# Patient Record
Sex: Female | Born: 1985 | Race: White | Hispanic: No | Marital: Single | Smoking: Never smoker
Health system: Southern US, Community
[De-identification: ages and names within clinical notes are randomized; demographics above are authoritative.]

## PROBLEM LIST (undated history)

## (undated) DIAGNOSIS — E559 Vitamin D deficiency, unspecified: Secondary | ICD-10-CM

## (undated) DIAGNOSIS — G4730 Sleep apnea, unspecified: Secondary | ICD-10-CM

## (undated) DIAGNOSIS — F329 Major depressive disorder, single episode, unspecified: Secondary | ICD-10-CM

## (undated) DIAGNOSIS — R7303 Prediabetes: Secondary | ICD-10-CM

## (undated) DIAGNOSIS — F909 Attention-deficit hyperactivity disorder, unspecified type: Secondary | ICD-10-CM

## (undated) DIAGNOSIS — R87612 Low grade squamous intraepithelial lesion on cytologic smear of cervix (LGSIL): Secondary | ICD-10-CM

## (undated) DIAGNOSIS — K602 Anal fissure, unspecified: Secondary | ICD-10-CM

## (undated) DIAGNOSIS — I471 Supraventricular tachycardia: Secondary | ICD-10-CM

## (undated) DIAGNOSIS — E669 Obesity, unspecified: Secondary | ICD-10-CM

## (undated) DIAGNOSIS — F32A Depression, unspecified: Secondary | ICD-10-CM

## (undated) DIAGNOSIS — Z8679 Personal history of other diseases of the circulatory system: Secondary | ICD-10-CM

## (undated) DIAGNOSIS — K769 Liver disease, unspecified: Secondary | ICD-10-CM

## (undated) DIAGNOSIS — B2790 Infectious mononucleosis, unspecified without complication: Secondary | ICD-10-CM

## (undated) DIAGNOSIS — I10 Essential (primary) hypertension: Secondary | ICD-10-CM

## (undated) DIAGNOSIS — F419 Anxiety disorder, unspecified: Secondary | ICD-10-CM

## (undated) DIAGNOSIS — M549 Dorsalgia, unspecified: Secondary | ICD-10-CM

## (undated) DIAGNOSIS — I499 Cardiac arrhythmia, unspecified: Secondary | ICD-10-CM

## (undated) DIAGNOSIS — K219 Gastro-esophageal reflux disease without esophagitis: Secondary | ICD-10-CM

## (undated) DIAGNOSIS — E785 Hyperlipidemia, unspecified: Secondary | ICD-10-CM

## (undated) HISTORY — DX: Anal fissure, unspecified: K60.2

## (undated) HISTORY — DX: Essential (primary) hypertension: I10

## (undated) HISTORY — DX: Low grade squamous intraepithelial lesion on cytologic smear of cervix (LGSIL): R87.612

## (undated) HISTORY — DX: Infectious mononucleosis, unspecified without complication: B27.90

## (undated) HISTORY — DX: Gastro-esophageal reflux disease without esophagitis: K21.9

## (undated) HISTORY — DX: Hyperlipidemia, unspecified: E78.5

## (undated) HISTORY — DX: Anxiety disorder, unspecified: F41.9

## (undated) HISTORY — DX: Personal history of other diseases of the circulatory system: Z86.79

## (undated) HISTORY — DX: Depression, unspecified: F32.A

## (undated) HISTORY — DX: Sleep apnea, unspecified: G47.30

## (undated) HISTORY — DX: Vitamin D deficiency, unspecified: E55.9

## (undated) HISTORY — DX: Obesity, unspecified: E66.9

## (undated) HISTORY — DX: Major depressive disorder, single episode, unspecified: F32.9

## (undated) HISTORY — DX: Attention-deficit hyperactivity disorder, unspecified type: F90.9

## (undated) HISTORY — DX: Liver disease, unspecified: K76.9

## (undated) HISTORY — PX: WISDOM TOOTH EXTRACTION: SHX21

## (undated) HISTORY — DX: Dorsalgia, unspecified: M54.9

## (undated) HISTORY — DX: Supraventricular tachycardia: I47.1

---

## 2012-12-13 DIAGNOSIS — J189 Pneumonia, unspecified organism: Secondary | ICD-10-CM

## 2012-12-13 HISTORY — DX: Pneumonia, unspecified organism: J18.9

## 2013-12-15 ENCOUNTER — Ambulatory Visit (INDEPENDENT_AMBULATORY_CARE_PROVIDER_SITE_OTHER): Payer: BC Managed Care – PPO | Admitting: Family Medicine

## 2013-12-15 ENCOUNTER — Ambulatory Visit: Payer: BC Managed Care – PPO

## 2013-12-15 VITALS — BP 106/84 | HR 88 | Temp 98.5°F | Resp 18

## 2013-12-15 DIAGNOSIS — M545 Low back pain, unspecified: Secondary | ICD-10-CM

## 2013-12-15 MED ORDER — HYDROCODONE-ACETAMINOPHEN 5-325 MG PO TABS: 1.0000 | ORAL_TABLET | Freq: Three times a day (TID) | ORAL | Status: DC | PRN

## 2013-12-15 MED ORDER — CYCLOBENZAPRINE HCL 10 MG PO TABS: 10.0000 mg | ORAL_TABLET | Freq: Two times a day (BID) | ORAL | Status: DC | PRN

## 2013-12-15 NOTE — Patient Instructions (Signed)
Keep moving around to keep your back loose.   Use the hydrocodone (pain medication) and the flexeril (muscle relaxer) as needed.  Remember that these medications can make you sleepy, especially when used in combination.   You may also use NSAID medication such as aleve or ibuprofen.    Let me know if you are not better in the next few days- Sooner if worse.

## 2013-12-15 NOTE — Progress Notes (Signed)
[facility] [address] [place] [address] [phone]  Date:  12/15/2013   Name:  Rose Cortez   DOB:  09-22-1986   MRN:  [id number]  PCP:  No primary provider on file.    Chief Complaint: Back Pain   History of Present Illness:  Rose Cortez is a 28 y.o. very pleasant female patient who presents with the following:  Today is Sunday. On Thursday evening she seemed to sprain her back while at work- she is not sure exactly what she did, but she bent to pick up a rack of glasses and she noted pain after that.  She is taking advil and using heat and ice.  However her pain seems to be getting worse.   She did have some back trouble a couple of years ago with pain in her lower back.  This resolved after she saw a chiropractor a few times The pain does run down her buttocks and sometimes into her left leg down to the knee.    She hurts more when she sits or lays down and then tries to stand up.    She is healthy except for high cholesterol and depression.  Admits she has not exercised much recently and thinks this may have triggered her back injury.  No hematuria or blood in her urine.  No history of kidney stones.   So far she has tried advil and generic "back and body" pills OTC.   She has not noted any persistent numbness or weakness in her legs. She will have an "occasional jolt" in her leg only.   No genital numbness or incontinence of bowel or bladder.    She has had to miss work for the last couple of days and will need a note.     Her menses were just about one month ago, and she expects to start again any day. She has not been SA for a few months and does not feel there is any risk of pregnancy   So far today she has taken 2 advil.   She was able to drive here today.   She did request that we do back x-rays. I did request that we check and HCG prior to doing x-rays However then changed her mind and elected not to have x-rays after all so canceled urine  as well.  Pregnancy risk is low and she will let us know if her menses do not come as usual.  There are no active problems to display for this patient.   Past Medical History  Diagnosis Date  . Hyperlipidemia   . Depression   . Anxiety     No past surgical history on file.  History  Substance Use Topics  . Smoking status: Never Smoker   . Smokeless tobacco: Not on file  . Alcohol Use: Yes     Comment: weekly    Family History  Problem Relation Age of Onset  . Hyperlipidemia Mother   . Hyperlipidemia Father   . Depression Sister     No Known Allergies  Medication list has been reviewed and updated.  No current outpatient prescriptions on file prior to visit.   No current facility-administered medications on file prior to visit.    Review of Systems:  As per HPI- otherwise negative.   Physical Examination: Filed Vitals:   12/15/13 1614  BP: 106/84  Pulse: 88  Temp: 98.5 F (36.9 C)  Resp: 18   Filed Vitals:  Cannot calculate BMI with a height equal to zero. Ideal Body Weight:    GEN: WDWN, NAD, Non-toxic, A & O x 3, appears well HEENT: Atraumatic, Normocephalic. Neck supple. No masses, No LAD. Ears and Nose: No external deformity. CV: RRR, No M/G/R. No JVD. No thrill. No extra heart sounds. PULM: CTA B, no wheezes, crackles, rhonchi. No retractions. No resp. distress. No accessory muscle use. EXTR: No c/c/e NEURO Normal gait.  PSYCH: Normally interactive. Conversant. Not depressed or anxious appearing.  Calm demeanor. Her lumbar paraspinous muscles are tight and tender bilaterally. She is also in spasm in her buttocks, left more than right.  Back flexion and extension quite limited by pain.   Normal strength, sensation and DTR both LE.  She has pain with SLR on the left only.    Assessment and Plan: Lumbar pain - Plan: HYDROcodone-acetaminophen (NORCO/VICODIN) 5-325 MG per tablet, cyclobenzaprine (FLEXERIL) 10 MG tablet, CANCELED: POCT urine  pregnancy, CANCELED: POCT urinalysis dipstick, CANCELED: DG Lumbar Spine Complete  Treat for lumbar pain and spasm with vicodin and flexeril as needed.  Cautioned against using these meds in combination as they may cause excessive drowsiness.  If she does use both try splitting flexeril in half.   Close follow-up if not better- Sooner if worse.     Signed Abbe AmsterdamJessica Annalucia Laino, MD

## 2014-01-26 ENCOUNTER — Ambulatory Visit (INDEPENDENT_AMBULATORY_CARE_PROVIDER_SITE_OTHER): Payer: BC Managed Care – PPO | Admitting: Internal Medicine

## 2014-01-26 VITALS — BP 134/82 | HR 102 | Temp 98.9°F | Resp 18 | Ht 68.0 in | Wt 224.1 lb

## 2014-01-26 DIAGNOSIS — J9801 Acute bronchospasm: Secondary | ICD-10-CM

## 2014-01-26 DIAGNOSIS — R05 Cough: Secondary | ICD-10-CM

## 2014-01-26 DIAGNOSIS — J019 Acute sinusitis, unspecified: Secondary | ICD-10-CM

## 2014-01-26 DIAGNOSIS — R059 Cough, unspecified: Secondary | ICD-10-CM

## 2014-01-26 MED ORDER — AMOXICILLIN 500 MG PO CAPS: 1000.0000 mg | ORAL_CAPSULE | Freq: Two times a day (BID) | ORAL | Status: AC

## 2014-01-26 MED ORDER — PREDNISONE 20 MG PO TABS: ORAL_TABLET | ORAL | Status: DC

## 2014-01-26 NOTE — Progress Notes (Signed)
This chart was scribed for Ellamae Sia, MD by Luisa Dago, ED Scribe. This patient was seen in room 8 and the patient's care was started at 11:25 AM. Subjective:    Patient ID: Rose Cortez, female    DOB: July 24, 1986, 28 y.o.   MRN: [id number]  HPI HPI Comments: Rose Cortez is a 28 y.o. female who works at a [employer] [facility] complaining of a constant cough that started 1 month ago. Pt states that she's had three colds in the last month. She states that the cough is worse upon waking but does not interfere with daily activities as the day goes on. She is also complaining of associated sinus congestion and pressure. Denies any ear pain, sore throat, fever, or chills.    There are no active problems to display for this patient.  Past Medical History  Diagnosis Date   Hyperlipidemia    Depression    Anxiety    No past surgical history on file. No Known Allergies Prior to Admission medications   Medication Sig Start Date End Date Taking? Authorizing Provider  citalopram (CELEXA) 10 MG tablet Take 10 mg by mouth daily.   Yes Historical Provider, MD  simvastatin (ZOCOR) 40 MG tablet Take 40 mg by mouth daily.   Yes Historical Provider, MD   History   Social History   Marital Status: Single    Spouse Name: N/A    Number of Children: N/A   Years of Education: N/A   Occupational History   Not on file.   Social History Main Topics   Smoking status: Never Smoker    Smokeless tobacco: Not on file   Alcohol Use: Yes     Comment: weekly   Drug Use: Not on file   Sexual Activity: Yes   Other Topics Concern   Not on file   Social History Narrative   No narrative on file   Review of Systems  review of systems was obtained and all systems are negative except as noted in the HPI and PMH.      Objective:   Physical Exam  Nursing note and vitals reviewed. Constitutional: She is oriented to person, place, and time. She  appears well-developed and well-nourished. No distress.  HENT:  Head: Normocephalic and atraumatic.  Right Ear: External ear normal.  Left Ear: External ear normal.  Nose: Nose normal.  Mouth/Throat: Oropharynx is clear and moist. No oropharyngeal exudate.  Eyes: Conjunctivae and EOM are normal. Pupils are equal, round, and reactive to light. Right eye exhibits no discharge. Left eye exhibits no discharge.  Neck: Neck supple. No thyromegaly present.  Cardiovascular: Normal rate, regular rhythm, normal heart sounds and intact distal pulses.   No murmur heard. Pulmonary/Chest: Effort normal. No respiratory distress. She has wheezes in the left middle field and the left lower field.  Musculoskeletal: Normal range of motion. She exhibits no edema and no tenderness.  Lymphadenopathy:    She has no cervical adenopathy.  Neurological: She is alert and oriented to person, place, and time.  Skin: Skin is warm and dry.  Psychiatric: She has a normal mood and affect. Her behavior is normal.     Filed Vitals:   01/26/14 1039  BP: 134/82  Pulse: 102  Temp: 98.9 F (37.2 C)  TempSrc: Oral  Resp: 18  Height: 5\' 8"  (1.727 m)  Weight: 224 lb 2 oz (101.662 kg)  SpO2: 98%        Assessment &  Plan:  Acute bronchospasm  Acute sinusitis, unspecified  Cough  Meds ordered this encounter  Medications   amoxicillin (AMOXIL) 500 MG capsule    Sig: Take 2 capsules (1,000 mg total) by mouth 2 (two) times daily.    Dispense:  40 capsule    Refill:  0   predniSONE (DELTASONE) 20 MG tablet    Sig: 3/3/2/2/1/1 single daily dose for 6 days    Dispense:  12 tablet    Refill:  0   Following in 1 week if not well  I have completed the patient encounter in its entirety as documented by the scribe, with editing by me where necessary. Robert P. Merla Richesoolittle, M.D.

## 2014-02-03 ENCOUNTER — Ambulatory Visit (INDEPENDENT_AMBULATORY_CARE_PROVIDER_SITE_OTHER): Payer: BC Managed Care – PPO | Admitting: Emergency Medicine

## 2014-02-03 ENCOUNTER — Ambulatory Visit: Payer: BC Managed Care – PPO

## 2014-02-03 VITALS — BP 120/82 | HR 115 | Temp 98.1°F | Resp 18 | Ht 68.5 in | Wt 227.4 lb

## 2014-02-03 DIAGNOSIS — R05 Cough: Secondary | ICD-10-CM

## 2014-02-03 DIAGNOSIS — R059 Cough, unspecified: Secondary | ICD-10-CM

## 2014-02-03 DIAGNOSIS — J018 Other acute sinusitis: Secondary | ICD-10-CM

## 2014-02-03 DIAGNOSIS — J209 Acute bronchitis, unspecified: Secondary | ICD-10-CM

## 2014-02-03 MED ORDER — ALBUTEROL SULFATE HFA 108 (90 BASE) MCG/ACT IN AERS: 2.0000 | INHALATION_SPRAY | RESPIRATORY_TRACT | Status: DC | PRN

## 2014-02-03 MED ORDER — PROMETHAZINE-CODEINE 6.25-10 MG/5ML PO SYRP: 5.0000 mL | ORAL_SOLUTION | Freq: Four times a day (QID) | ORAL | Status: DC | PRN

## 2014-02-03 NOTE — Patient Instructions (Signed)

## 2014-02-03 NOTE — Progress Notes (Signed)
[facility] [address], [place] [id number] [phone]  Date:  02/03/2014   Name:  Rose Cortez   DOB:  05-Apr-1986   MRN:  [id number]  PCP:  No primary provider on file.    Chief Complaint: Follow-up   History of Present Illness:  Rose Cortez is a 28 y.o. very pleasant female patient who presents with the following:  Seen a week ago with a month long cough productive of scant mucopurulent sputum.   Treated with prednisone and prednisone. Says wheezing has improved.  She still feels congestion in her head and no shortness of breath.  Says her wheezing now is limited to post tussive.  No fever or chills.  No nausea or vomiting.  No stool change or rash.  Lacks energy.  No improvement with over the counter medications or other home remedies. Denies other complaint or health concern today.   There are no active problems to display for this patient.   Past Medical History  Diagnosis Date  . Hyperlipidemia   . Depression   . Anxiety     No past surgical history on file.  History  Substance Use Topics  . Smoking status: Never Smoker   . Smokeless tobacco: Not on file  . Alcohol Use: Yes     Comment: weekly    Family History  Problem Relation Age of Onset  . Hyperlipidemia Mother   . Hyperlipidemia Father   . Depression Sister     No Known Allergies  Medication list has been reviewed and updated.  Current Outpatient Prescriptions on File Prior to Visit  Medication Sig Dispense Refill  . amoxicillin (AMOXIL) 500 MG capsule Take 2 capsules (1,000 mg total) by mouth 2 (two) times daily.  40 capsule  0  . citalopram (CELEXA) 10 MG tablet Take 10 mg by mouth daily.      . simvastatin (ZOCOR) 40 MG tablet Take 40 mg by mouth daily.      . predniSONE (DELTASONE) 20 MG tablet 3/3/2/2/1/1 single daily dose for 6 days  12 tablet  0   No current facility-administered medications on file prior to visit.    Review of Systems:  As per HPI, otherwise  negative.    Physical Examination: Filed Vitals:   02/03/14 0819  BP: 120/82  Pulse: 115  Temp: 98.1 F (36.7 C)  Resp: 18   Filed Vitals:   02/03/14 0819  Height: 5' 8.5" (1.74 m)  Weight: 227 lb 6.4 oz (103.148 kg)   Body mass index is 34.07 kg/(m^2). Ideal Body Weight: Weight in (lb) to have BMI = 25: 166.5  GEN: WDWN, NAD, Non-toxic, A & O x 3 HEENT: Atraumatic, Normocephalic. Neck supple. No masses, No LAD. Ears and Nose: No external deformity. Purulent nasal drainage CV: RRR, No M/G/R. No JVD. No thrill. No extra heart sounds. PULM: CTA B, no wheezes, crackles, rhonchi. No retractions. No resp. distress. No accessory muscle use. ABD: S, NT, ND, +BS. No rebound. No HSM. EXTR: No c/c/e NEURO Normal gait.  PSYCH: Normally interactive. Conversant. Not depressed or anxious appearing.  Calm demeanor.    Assessment and Plan: Sinusitis Bronchitis Phen c cod Albuterol  Signed,  Phillips OdorJeffery Audreana Hancox, MD   [facility] reading (PRIMARY) by  Dr. Dareen PianoAnderson.  Chest negative.

## 2014-07-08 ENCOUNTER — Ambulatory Visit (INDEPENDENT_AMBULATORY_CARE_PROVIDER_SITE_OTHER): Payer: BC Managed Care – PPO

## 2014-07-08 ENCOUNTER — Ambulatory Visit (INDEPENDENT_AMBULATORY_CARE_PROVIDER_SITE_OTHER): Payer: BC Managed Care – PPO | Admitting: Family Medicine

## 2014-07-08 VITALS — BP 144/90 | HR 102 | Temp 98.5°F | Resp 20 | Ht 68.0 in | Wt 225.6 lb

## 2014-07-08 DIAGNOSIS — M5430 Sciatica, unspecified side: Secondary | ICD-10-CM

## 2014-07-08 DIAGNOSIS — M5442 Lumbago with sciatica, left side: Secondary | ICD-10-CM

## 2014-07-08 MED ORDER — METHYLPREDNISOLONE SODIUM SUCC 125 MG IJ SOLR
125.0000 mg | Freq: Once | INTRAMUSCULAR | Status: AC
  Administered 2014-07-08: 125 mg via INTRAMUSCULAR

## 2014-07-08 MED ORDER — KETOROLAC TROMETHAMINE 60 MG/2ML IM SOLN
60.0000 mg | Freq: Once | INTRAMUSCULAR | Status: AC
  Administered 2014-07-08: 60 mg via INTRAMUSCULAR

## 2014-07-08 MED ORDER — OXYCODONE-ACETAMINOPHEN 5-325 MG PO TABS: 1.0000 | ORAL_TABLET | Freq: Three times a day (TID) | ORAL | Status: DC | PRN

## 2014-07-08 MED ORDER — PREDNISONE 10 MG PO TABS: ORAL_TABLET | ORAL | Status: DC

## 2014-07-08 MED ORDER — CYCLOBENZAPRINE HCL 10 MG PO TABS: 10.0000 mg | ORAL_TABLET | Freq: Three times a day (TID) | ORAL | Status: DC | PRN

## 2014-07-08 NOTE — Patient Instructions (Signed)
If you have any numbness, weakness, fevers, chills, or changes in your bowels or bladder, come back immediately. Start taking a senokot S twice a day for your constipation as the oxycodone will only make it worse.  Sciatica Sciatica is pain, weakness, numbness, or tingling along the path of the sciatic nerve. The nerve starts in the lower back and runs down the back of each leg. The nerve controls the muscles in the lower leg and in the back of the knee, while also providing sensation to the back of the thigh, lower leg, and the sole of your foot. Sciatica is a symptom of another medical condition. For instance, nerve damage or certain conditions, such as a herniated disk or bone spur on the spine, pinch or put pressure on the sciatic nerve. This causes the pain, weakness, or other sensations normally associated with sciatica. Generally, sciatica only affects one side of the body. CAUSES   Herniated or slipped disc.  Degenerative disk disease.  A pain disorder involving the narrow muscle in the buttocks (piriformis syndrome).  Pelvic injury or fracture.  Pregnancy.  Tumor (rare). SYMPTOMS  Symptoms can vary from mild to very severe. The symptoms usually travel from the low back to the buttocks and down the back of the leg. Symptoms can include:  Mild tingling or dull aches in the lower back, leg, or hip.  Numbness in the back of the calf or sole of the foot.  Burning sensations in the lower back, leg, or hip.  Sharp pains in the lower back, leg, or hip.  Leg weakness.  Severe back pain inhibiting movement. These symptoms may get worse with coughing, sneezing, laughing, or prolonged sitting or standing. Also, being overweight may worsen symptoms. DIAGNOSIS  Your caregiver will perform a physical exam to look for common symptoms of sciatica. He or she may ask you to do certain movements or activities that would trigger sciatic nerve pain. Other tests may be performed to find the cause  of the sciatica. These may include:  Blood tests.  X-rays.  Imaging tests, such as an MRI or CT scan. TREATMENT  Treatment is directed at the cause of the sciatic pain. Sometimes, treatment is not necessary and the pain and discomfort goes away on its own. If treatment is needed, your caregiver may suggest:  Over-the-counter medicines to relieve pain.  Prescription medicines, such as anti-inflammatory medicine, muscle relaxants, or narcotics.  Applying heat or ice to the painful area.  Steroid injections to lessen pain, irritation, and inflammation around the nerve.  Reducing activity during periods of pain.  Exercising and stretching to strengthen your abdomen and improve flexibility of your spine. Your caregiver may suggest losing weight if the extra weight makes the back pain worse.  Physical therapy.  Surgery to eliminate what is pressing or pinching the nerve, such as a bone spur or part of a herniated disk. HOME CARE INSTRUCTIONS   Only take over-the-counter or prescription medicines for pain or discomfort as directed by your caregiver.  Apply ice to the affected area for 20 minutes, 3-4 times a day for the first 48-72 hours. Then try heat in the same way.  Exercise, stretch, or perform your usual activities if these do not aggravate your pain.  Attend physical therapy sessions as directed by your caregiver.  Keep all follow-up appointments as directed by your caregiver.  Do not wear high heels or shoes that do not provide proper support.  Check your mattress to see if it is  too soft. A firm mattress may lessen your pain and discomfort. SEEK IMMEDIATE MEDICAL CARE IF:   You lose control of your bowel or bladder (incontinence).  You have increasing weakness in the lower back, pelvis, buttocks, or legs.  You have redness or swelling of your back.  You have a burning sensation when you urinate.  You have pain that gets worse when you lie down or awakens you at  night.  Your pain is worse than you have experienced in the past.  Your pain is lasting longer than 4 weeks.  You are suddenly losing weight without reason. MAKE SURE YOU:  Understand these instructions.  Will watch your condition.  Will get help right away if you are not doing well or get worse. Document Released: 11/23/2001 Document Revised: 05/30/2012 Document Reviewed: 04/09/2012 [facility] Patient Information 2015 [employer] [place]. This information is not intended to replace advice given to you by your health care provider. Make sure you discuss any questions you have with your health care provider.

## 2014-07-08 NOTE — Progress Notes (Addendum)
Subjective:  This chart was scribed for Norberto Sorenson, MD by Evon Slack, ED Scribe. This Patient was seen in room 08 and the patients care was started at 6:13 PM   Patient ID: Rose Cortez, female    DOB: April 03, 1986, 28 y.o.   MRN: [id number]  Chief Complaint  Patient presents with  . Back Pain    lower back pain x off and on since jan.  this is the worst flare yet.  unable to sit still, or lay for long periods of time.    Back Pain Pertinent negatives include no abdominal pain, fever, numbness or weakness.   Was seen in jan for low back pain thought to be functional from twisting and lifting at work failed OTC treatment was prescribed hydrocodone and flexeril. X rays offered but declined. No further follow up with treatment was needed.   She presents today for lower back pain. She states she went to a chiropractor with some relief. She states her pained recently worsened today. She states she went to the chiropractor today and plans to go back tomorrow. She states that her pain is worse when sitting down. She states the chiropractor stated she had a bulging disc between the L4 and L5. She stats that the pain radiates down into her left leg. She states she has taken tylenol with no relief. Pt is is breathing deeply and standing rocking back on her feet and is unable to sit. She states she has noticed that she had some constipation today. She states that nothing specific brought on this episode of back pain.   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   Past Medical History  Diagnosis Date  . Hyperlipidemia   . Depression   . Anxiety    Current Outpatient Prescriptions on File Prior to Visit  Medication Sig Dispense Refill  . citalopram (CELEXA) 10 MG tablet Take 10 mg by mouth daily.      . simvastatin (ZOCOR) 40 MG tablet Take 40 mg by mouth daily.       No current facility-administered medications on file prior to visit.   No Known Allergies  Review of Systems    Constitutional: Positive for activity change and unexpected weight change. Negative for fever and chills.  Gastrointestinal: Positive for constipation. Negative for nausea, vomiting, abdominal pain and diarrhea.  Genitourinary: Negative for urgency, frequency, decreased urine volume and difficulty urinating.  Musculoskeletal: Positive for back pain and myalgias. Negative for arthralgias, gait problem and joint swelling.  Skin: Negative for color change, rash and wound.  Neurological: Negative for weakness and numbness.  Hematological: Negative for adenopathy. Does not bruise/bleed easily.  Psychiatric/Behavioral: Positive for sleep disturbance.     Objective:  BP 144/90  Pulse 102  Temp(Src) 98.5 F (36.9 C) (Oral)  Resp 20  Ht 5\' 8"  (1.727 m)  Wt 225 lb 9.6 oz (102.331 kg)  BMI 34.31 kg/m2  SpO2 98%  LMP 06/08/2014   Physical Exam  Nursing note and vitals reviewed. Constitutional: She is oriented to person, place, and time. She appears well-developed and well-nourished. No distress.  HENT:  Head: Normocephalic and atraumatic.  Eyes: Conjunctivae and EOM are normal.  Neck: Neck supple. No tracheal deviation present.  Cardiovascular: Normal rate.   Pulmonary/Chest: Effort normal. No respiratory distress.  Musculoskeletal: Normal range of motion.  Mild pain over left lower paraspinal , no pain over right paraspinal or lumbar spinous process, no pain over SI joint, unable to SLR due to pain.  Neurological: She is alert and oriented to person, place, and time.  Reflex Scores:      Patellar reflexes are 2+ on the right side and 2+ on the left side.      Achilles reflexes are 2+ on the right side and 2+ on the left side. Skin: Skin is warm and dry.  Psychiatric: She has a normal mood and affect. Her behavior is normal.     Primary x-ray reading by Dr. Clelia CroftShaw. No lumbar spine abnormalities. L4 and L5 obsctructed by gas  SI: no abnormality seen.  EXAM: LUMBAR SPINE - COMPLETE 4+  VIEW  COMPARISON: None.  FINDINGS: Five lumbar type vertebral bodies are normal in height and alignment. No acute fracture, pars defect, or suspicious bony abnormality is identified. Disc spaces are preserved. Sacroiliac joints are unremarkable. Visualized bowel gas pattern is normal.  IMPRESSION: No acute bony abnormality or significant degenerative  Assessment & Plan:   Midline low back pain with left-sided sciatica - Plan: DG Lumbar Spine Complete, DG Si Joints, ketorolac (TORADOL) injection 60 mg, methylPREDNISolone sodium succinate (SOLU-MEDROL) 125 mg/2 mL injection 125 mg Pt w/ severe pain in office - concerning since has been worsening over the past 6 mos despite prior chiropractor treatment. Rec steroid burst in additional to pain relief. Handout given.  Hold off on additional chiropractor care for now. Recheck in about 2 wks - sooner if worsening or any alarm sxs, and will likely need to consider PT vs MRI at that time due to young age and no h/o injury w/ severe recurrent sxs.    Meds ordered this encounter  Medications  . ketorolac (TORADOL) injection 60 mg    Sig:   . methylPREDNISolone sodium succinate (SOLU-MEDROL) 125 mg/2 mL injection 125 mg    Sig:   . oxyCODONE-acetaminophen (ROXICET) 5-325 MG per tablet    Sig: Take 1 tablet by mouth every 8 (eight) hours as needed for severe pain.    Dispense:  20 tablet    Refill:  0  . predniSONE (DELTASONE) 10 MG tablet    Sig: 6-5-4-3-2-1 tabs po qd    Dispense:  21 tablet    Refill:  0  . cyclobenzaprine (FLEXERIL) 10 MG tablet    Sig: Take 1 tablet (10 mg total) by mouth 3 (three) times daily as needed for muscle spasms.    Dispense:  30 tablet    Refill:  0    I personally performed the services described in this documentation, which was scribed in my presence. The recorded information has been reviewed and considered, and addended by me as needed.  Norberto SorensonEva Brittannie Tawney, MD MPH

## 2014-07-09 ENCOUNTER — Telehealth: Payer: Self-pay | Admitting: Family Medicine

## 2014-07-09 MED ORDER — MELOXICAM 15 MG PO TABS: 15.0000 mg | ORAL_TABLET | Freq: Every day | ORAL | Status: DC

## 2014-07-09 NOTE — Telephone Encounter (Signed)
Pt states there was another medication you were going to call in to begin after she completes the prednisone.  Please advise

## 2014-07-09 NOTE — Telephone Encounter (Signed)
meloxicam sent in - sorry i forgot!

## 2015-02-18 ENCOUNTER — Ambulatory Visit (INDEPENDENT_AMBULATORY_CARE_PROVIDER_SITE_OTHER): Payer: Self-pay | Admitting: Physician Assistant

## 2015-02-18 VITALS — BP 140/94 | HR 98 | Temp 98.2°F | Resp 18 | Ht 68.75 in | Wt 254.0 lb

## 2015-02-18 DIAGNOSIS — M5442 Lumbago with sciatica, left side: Secondary | ICD-10-CM

## 2015-02-18 MED ORDER — OXYCODONE-ACETAMINOPHEN 5-325 MG PO TABS: 1.0000 | ORAL_TABLET | Freq: Three times a day (TID) | ORAL | Status: DC | PRN

## 2015-02-18 MED ORDER — MELOXICAM 15 MG PO TABS: 15.0000 mg | ORAL_TABLET | Freq: Every day | ORAL | Status: DC

## 2015-02-18 MED ORDER — KETOROLAC TROMETHAMINE 60 MG/2ML IM SOLN
60.0000 mg | Freq: Once | INTRAMUSCULAR | Status: AC
  Administered 2015-02-18: 60 mg via INTRAMUSCULAR

## 2015-02-18 MED ORDER — CYCLOBENZAPRINE HCL 10 MG PO TABS: 10.0000 mg | ORAL_TABLET | Freq: Every evening | ORAL | Status: DC | PRN

## 2015-02-18 MED ORDER — PREDNISONE 10 MG PO TABS: ORAL_TABLET | ORAL | Status: DC

## 2015-02-18 NOTE — Patient Instructions (Signed)
Sciatica Sciatica is pain, weakness, numbness, or tingling along the path of the sciatic nerve. The nerve starts in the lower back and runs down the back of each leg. The nerve controls the muscles in the lower leg and in the back of the knee, while also providing sensation to the back of the thigh, lower leg, and the sole of your foot. Sciatica is a symptom of another medical condition. For instance, nerve damage or certain conditions, such as a herniated disk or bone spur on the spine, pinch or put pressure on the sciatic nerve. This causes the pain, weakness, or other sensations normally associated with sciatica. Generally, sciatica only affects one side of the body. CAUSES   Herniated or slipped disc.  Degenerative disk disease.  A pain disorder involving the narrow muscle in the buttocks (piriformis syndrome).  Pelvic injury or fracture.  Pregnancy.  Tumor (rare). SYMPTOMS  Symptoms can vary from mild to very severe. The symptoms usually travel from the low back to the buttocks and down the back of the leg. Symptoms can include:  Mild tingling or dull aches in the lower back, leg, or hip.  Numbness in the back of the calf or sole of the foot.  Burning sensations in the lower back, leg, or hip.  Sharp pains in the lower back, leg, or hip.  Leg weakness.  Severe back pain inhibiting movement. These symptoms may get worse with coughing, sneezing, laughing, or prolonged sitting or standing. Also, being overweight may worsen symptoms. DIAGNOSIS  Your caregiver will perform a physical exam to look for common symptoms of sciatica. He or she may ask you to do certain movements or activities that would trigger sciatic nerve pain. Other tests may be performed to find the cause of the sciatica. These may include:  Blood tests.  X-rays.  Imaging tests, such as an MRI or CT scan. TREATMENT  Treatment is directed at the cause of the sciatic pain. Sometimes, treatment is not necessary  and the pain and discomfort goes away on its own. If treatment is needed, your caregiver may suggest:  Over-the-counter medicines to relieve pain.  Prescription medicines, such as anti-inflammatory medicine, muscle relaxants, or narcotics.  Applying heat or ice to the painful area.  Steroid injections to lessen pain, irritation, and inflammation around the nerve.  Reducing activity during periods of pain.  Exercising and stretching to strengthen your abdomen and improve flexibility of your spine. Your caregiver may suggest losing weight if the extra weight makes the back pain worse.  Physical therapy.  Surgery to eliminate what is pressing or pinching the nerve, such as a bone spur or part of a herniated disk. HOME CARE INSTRUCTIONS   Only take over-the-counter or prescription medicines for pain or discomfort as directed by your caregiver.  Apply ice to the affected area for 20 minutes, 3-4 times a day for the first 48-72 hours. Then try heat in the same way.  Exercise, stretch, or perform your usual activities if these do not aggravate your pain.  Attend physical therapy sessions as directed by your caregiver.  Keep all follow-up appointments as directed by your caregiver.  Do not wear high heels or shoes that do not provide proper support.  Check your mattress to see if it is too soft. A firm mattress may lessen your pain and discomfort. SEEK IMMEDIATE MEDICAL CARE IF:   You lose control of your bowel or bladder (incontinence).  You have increasing weakness in the lower back, pelvis, buttocks,   or legs.  You have redness or swelling of your back.  You have a burning sensation when you urinate.  You have pain that gets worse when you lie down or awakens you at night.  Your pain is worse than you have experienced in the past.  Your pain is lasting longer than 4 weeks.  You are suddenly losing weight without reason. MAKE SURE YOU:  Understand these  instructions.  Will watch your condition.  Will get help right away if you are not doing well or get worse. Document Released: 11/23/2001 Document Revised: 05/30/2012 Document Reviewed: 04/09/2012 Exit[employer] Patient Information 2015 [employer]. This information is not intended to replace advice given to you by your health care provider. Make sure you discuss any questions you have with your health care provider.  

## 2015-02-18 NOTE — Progress Notes (Signed)
Subjective:    Patient ID: Rose Cortez, female    DOB: 12/22/1985, 29 y.o.   MRN: [id number]  HPI Patient presents for lower back pain and sciatica flare that has gotten progressively worse over past 4 days. Is now having 9/10 achy pain and sharp, shooting pains down left leg. Pain radiates into buttocks bilaterally. Sitting makes pain worse and laying brings some relief. Has tried Aleve, tylenol, ice, and hot baths without much relief. First diagnosed 02/2014 and had flare previously 06/2014. Was sent to PT and given flexeril, toradol, meloxicam, and prednisone which brought relief. Last saw PT 07/2014, but has done the exercises from time to time, but not consistently. Denies recent trauma, falls, or accidents. Endorses tingling sensation in left leg. Denies incontinence, urinary sx, weakness, numbness, loss of sensation/ROM/function.   Review of Systems  Constitutional: Positive for activity change.  Gastrointestinal: Negative for abdominal pain.  Genitourinary: Negative for dysuria, frequency and enuresis.  Musculoskeletal: Positive for myalgias and back pain. Negative for joint swelling, arthralgias, gait problem, neck pain and neck stiffness.  Neurological: Negative for weakness, numbness and headaches.       Objective:   Physical Exam  Constitutional: She is oriented to person, place, and time. She appears well-developed and well-nourished. No distress.  Blood pressure 140/94, pulse 98, temperature 98.2 F (36.8 C), temperature source Oral, resp. rate 18, height 5' 8.75" (1.746 m), weight 254 lb (115.214 kg), last menstrual period 01/29/2015, SpO2 98 %.  HENT:  Head: Normocephalic and atraumatic.  Right Ear: External ear normal.  Left Ear: External ear normal.  Eyes: Right eye exhibits no discharge. Left eye exhibits no discharge. No scleral icterus.  Neck: Normal range of motion.  Cardiovascular: Normal rate, regular rhythm and normal heart sounds.  Exam reveals no gallop and no  friction rub.   No murmur heard. Pulmonary/Chest: Effort normal and breath sounds normal. No respiratory distress. She has no wheezes. She has no rales.  Musculoskeletal: She exhibits no edema or tenderness.       Thoracic back: Normal.       Lumbar back: She exhibits decreased range of motion and pain. She exhibits no tenderness, no bony tenderness, no swelling, no edema, no deformity, no laceration, no spasm and normal pulse.  Neurological: She is alert and oriented to person, place, and time. She has normal strength and normal reflexes. She displays no atrophy. No cranial nerve deficit or sensory deficit. She exhibits normal muscle tone. Coordination normal.  Skin: Skin is warm and dry. No rash noted. She is not diaphoretic. No erythema. No pallor.       Assessment & Plan:  1. Midline low back pain with left-sided sciatica Should restart PT exercises. Advised to go back to PT if needs a refresher on exercises, but states she does not. In addition to medication regimen, should alternate ice and heating pad for 15-20 min 3-4x daily. Note to return to work 02/20/15 given. - ketorolac (TORADOL) injection 60 mg; Inject 2 mLs (60 mg total) into the muscle once. - cyclobenzaprine (FLEXERIL) 10 MG tablet; Take 1 tablet (10 mg total) by mouth at bedtime as needed for muscle spasms.  Dispense: 30 tablet; Refill: 0 - meloxicam (MOBIC) 15 MG tablet; Take 1 tablet (15 mg total) by mouth daily.  Dispense: 30 tablet; Refill: 1 - oxyCODONE-acetaminophen (ROXICET) 5-325 MG per tablet; Take 1 tablet by mouth every 8 (eight) hours as needed for severe pain.  Dispense: 20 tablet; Refill: 0 - predniSONE (DELTASONE)  10 MG tablet; 6-5-4-3-2-1 tabs po qd  Dispense: 21 tablet; Refill: 0   Danny Zimny PA-C  [facility] 02/18/2015 9:05 AM

## 2015-04-14 ENCOUNTER — Ambulatory Visit (INDEPENDENT_AMBULATORY_CARE_PROVIDER_SITE_OTHER): Payer: BLUE CROSS/BLUE SHIELD | Admitting: Family Medicine

## 2015-04-14 ENCOUNTER — Encounter: Payer: Self-pay | Admitting: Family Medicine

## 2015-04-14 VITALS — BP 146/92 | HR 97 | Temp 98.5°F | Resp 16 | Ht 68.5 in | Wt 253.0 lb

## 2015-04-14 DIAGNOSIS — F411 Generalized anxiety disorder: Secondary | ICD-10-CM | POA: Insufficient documentation

## 2015-04-14 DIAGNOSIS — E785 Hyperlipidemia, unspecified: Secondary | ICD-10-CM | POA: Diagnosis not present

## 2015-04-14 DIAGNOSIS — E669 Obesity, unspecified: Secondary | ICD-10-CM | POA: Diagnosis not present

## 2015-04-14 DIAGNOSIS — E661 Drug-induced obesity: Secondary | ICD-10-CM | POA: Insufficient documentation

## 2015-04-14 MED ORDER — ALPRAZOLAM 0.25 MG PO TABS: 0.2500 mg | ORAL_TABLET | Freq: Two times a day (BID) | ORAL | Status: DC | PRN

## 2015-04-14 MED ORDER — CITALOPRAM HYDROBROMIDE 20 MG PO TABS: 20.0000 mg | ORAL_TABLET | Freq: Every day | ORAL | Status: DC

## 2015-04-14 MED ORDER — SIMVASTATIN 40 MG PO TABS: 40.0000 mg | ORAL_TABLET | Freq: Every day | ORAL | Status: DC

## 2015-04-14 NOTE — Patient Instructions (Signed)
Let's increase your celexa to 20 mg a day.  Please let me know how this is doing for you You can use the xanax as needed on occasion; however remember that this can be habit forming and sedating.   Work on getting back into exercise; try to walk for an hour a day.  Listening to music/ podcast or walking with a friend can make it more fun

## 2015-04-14 NOTE — Progress Notes (Signed)
[facility] [address], [place] [id number] [phone]  Date:  04/14/2015   Name:  Rose SimsMarie Balestrieri   DOB:  03/29/1986   MRN:  [id number]  PCP:  Pcp Not In System    Chief Complaint: Medication Refill and Medication Management   History of Present Illness:  Rose Cortez is a 29 y.o. very pleasant female patient who presents with the following:  She is here today to discuss refills of her simvastatin and her celexa.   She has been on celexa for approx 2 years.   Over the last couple of months she has noted some social anxiety and "seems to come out of nowhere."  She had to leave a social event recently as she got too nervous. She also notes that she feels more worried about things, she is eating more than she normally would. She had used xanax in the past, but stopped  She is not aware of any trigger for increased anxiety.   She is using melatonin at night.   She is really not getting any exercise; she feels like she is too afraid to start exercising in case she fails with weight loss.   Her weight has always been a roller coaster- she has had trouble Her PCP in [place] checked her lipids last year.   LMP 3/27- she is not SA so there is no chance of pregnancy   States that she has noted a cough for a couple of weeks.  Otherwise she feels well, but is not sure if she requires some sort of treatment.    Wt Readings from Last 3 Encounters:  04/14/15 253 lb (114.76 kg)  02/18/15 254 lb (115.214 kg)  07/08/14 225 lb 9.6 oz (102.331 kg)     There are no active problems to display for this patient.   Past Medical History  Diagnosis Date  . Hyperlipidemia   . Depression   . Anxiety     No past surgical history on file.  History  Substance Use Topics  . Smoking status: Never Smoker   . Smokeless tobacco: Not on file  . Alcohol Use: Yes     Comment: weekly    Family History  Problem Relation Age of Onset  . Hyperlipidemia Mother   . Hyperlipidemia  Father   . Depression Sister     No Known Allergies  Medication list has been reviewed and updated.  Current Outpatient Prescriptions on File Prior to Visit  Medication Sig Dispense Refill  . citalopram (CELEXA) 10 MG tablet Take 10 mg by mouth daily.    . simvastatin (ZOCOR) 40 MG tablet Take 40 mg by mouth daily.     No current facility-administered medications on file prior to visit.    Review of Systems:  As per HPI- otherwise negative.   Physical Examination: Filed Vitals:   04/14/15 1147  BP: 146/92  Pulse: 97  Temp: 98.5 F (36.9 C)  Resp: 16   Filed Vitals:   04/14/15 1147  Height: 5' 8.5" (1.74 m)  Weight: 253 lb (114.76 kg)   Body mass index is 37.9 kg/(m^2). Ideal Body Weight: Weight in (lb) to have BMI = 25: 166.5  GEN: WDWN, NAD, Non-toxic, A & O x 3, obese, looks well HEENT: Atraumatic, Normocephalic. Neck supple. No masses, No LAD. Ears and Nose: No external deformity. CV: RRR, No M/G/R. No JVD. No thrill. No extra heart sounds. PULM: CTA B, no wheezes, crackles, rhonchi. No retractions. No resp.  distress. No accessory muscle use. ABD: S, NT, ND, +BS. No rebound. No HSM. EXTR: No c/c/e NEURO Normal gait.  PSYCH: Normally interactive. Conversant. Not depressed or anxious appearing.  Calm demeanor.    Assessment and Plan: GAD (generalized anxiety disorder) - Plan: citalopram (CELEXA) 20 MG tablet, ALPRAZolam (XANAX) 0.25 MG tablet  Obesity   Will increase her celexa to 20 mg as she has noted some increase her in her social anxiety as of late. She is not exercising and notes that she feels self- conscious about her weight.  She will try and start exercising more.  Discussed xanax which she has used in the past.  She would like to try a small dose of this to have on hand just as needed.  Cautioned her regarding sedation and habit formation with this medication.   She will plan to see me for a PE in about 3 months, she will plan to do so and will  let me know if she is not doing well with increased dose of celexa   Signed Abbe Amsterdam, MD

## 2015-05-19 IMAGING — CR DG LUMBAR SPINE COMPLETE 4+V
5 series · 5 of 5 positions shown · non-contrast
Comparison: None.

CLINICAL DATA: Lower back pain.  No known injury.

EXAM:
LUMBAR SPINE - COMPLETE 4+ VIEW

[AP]
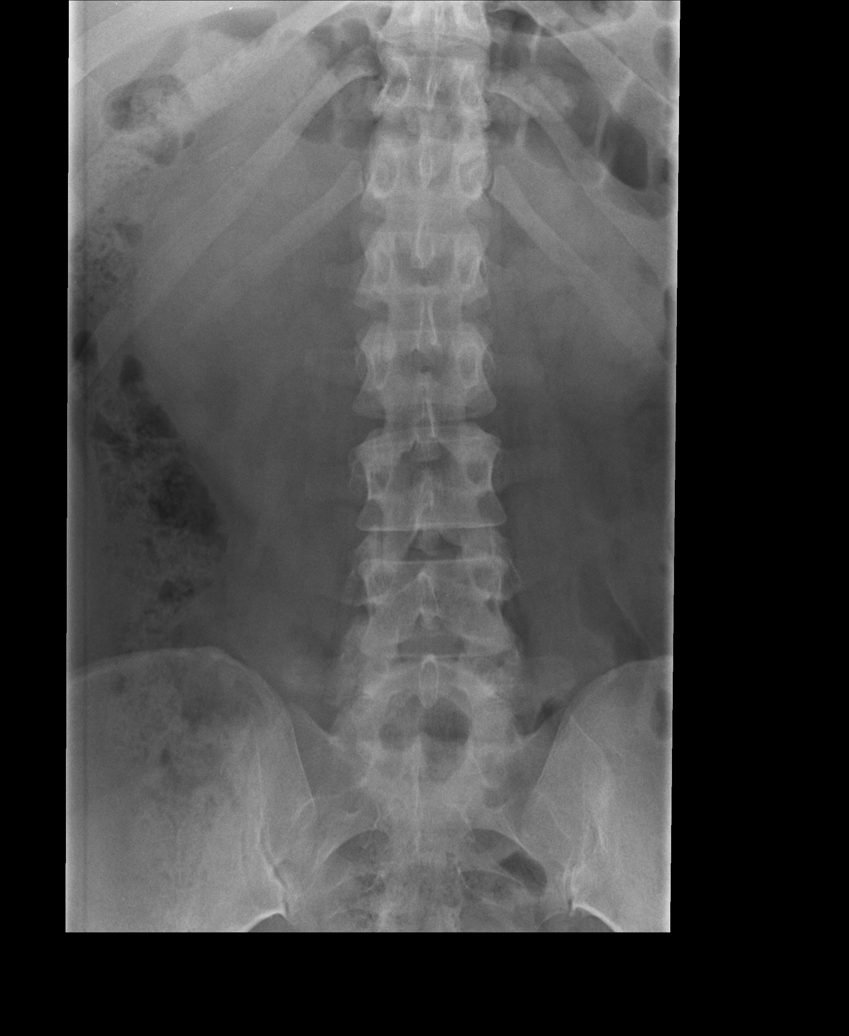

[rpo]
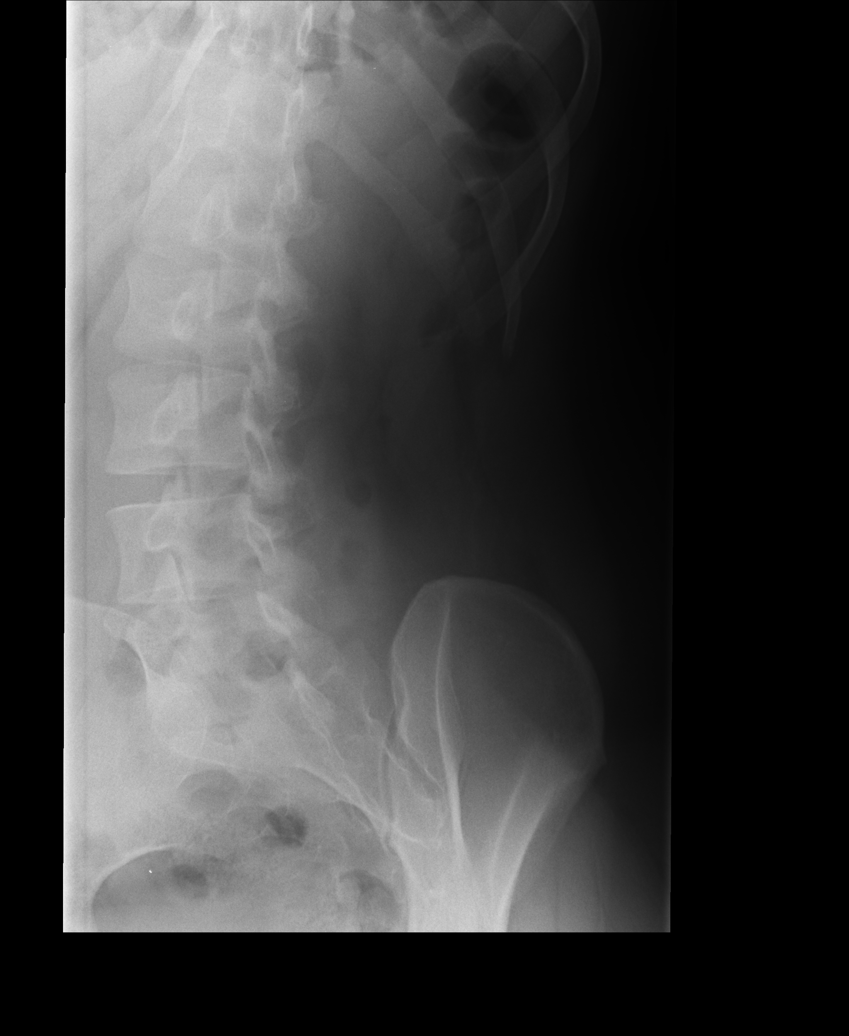

[lpo]
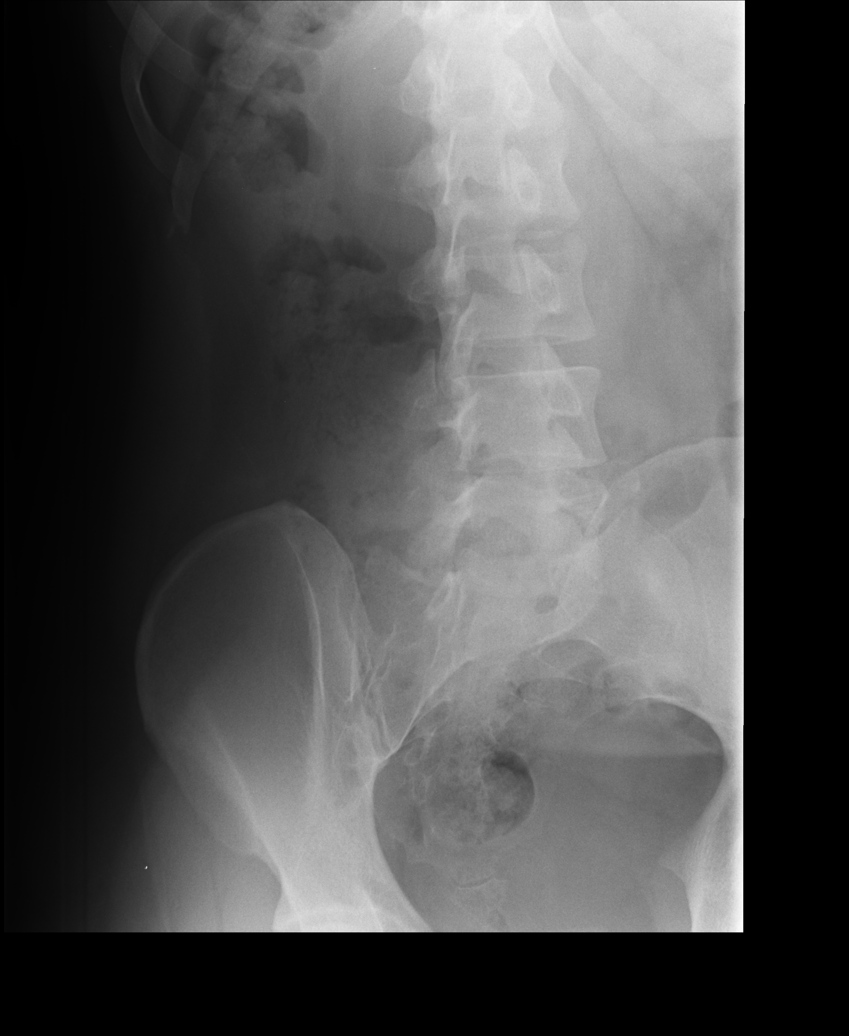

[lateral]
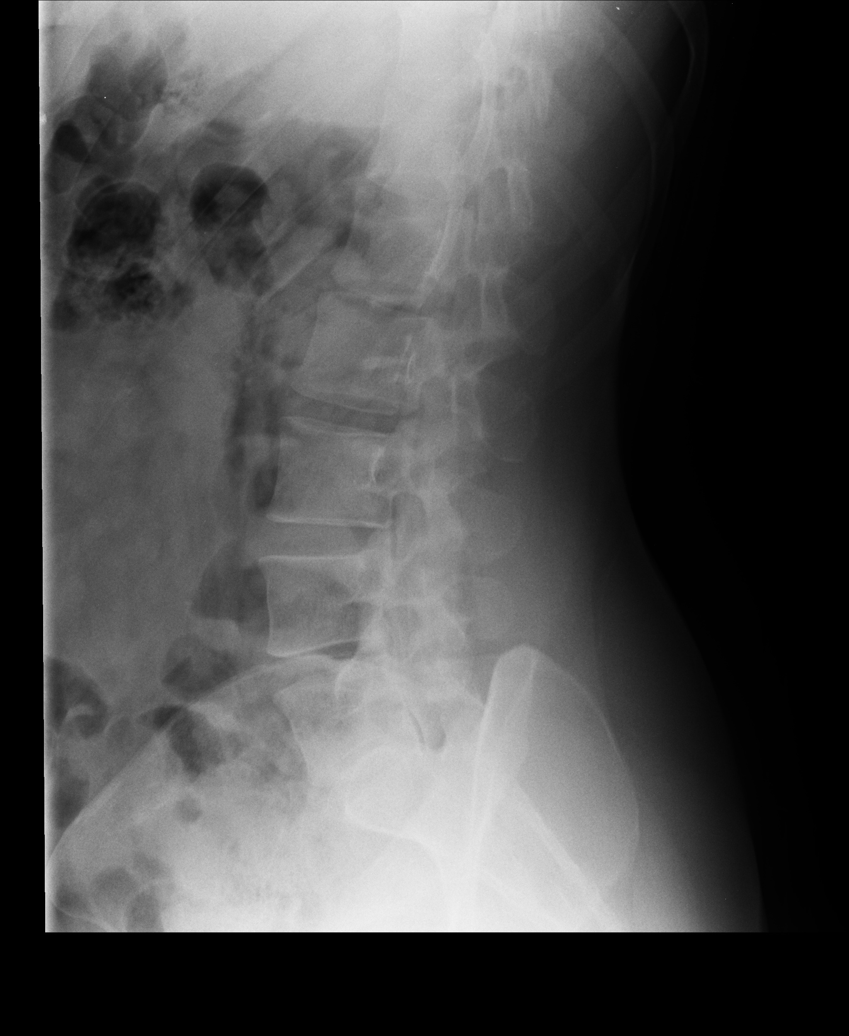

[l5 s1]
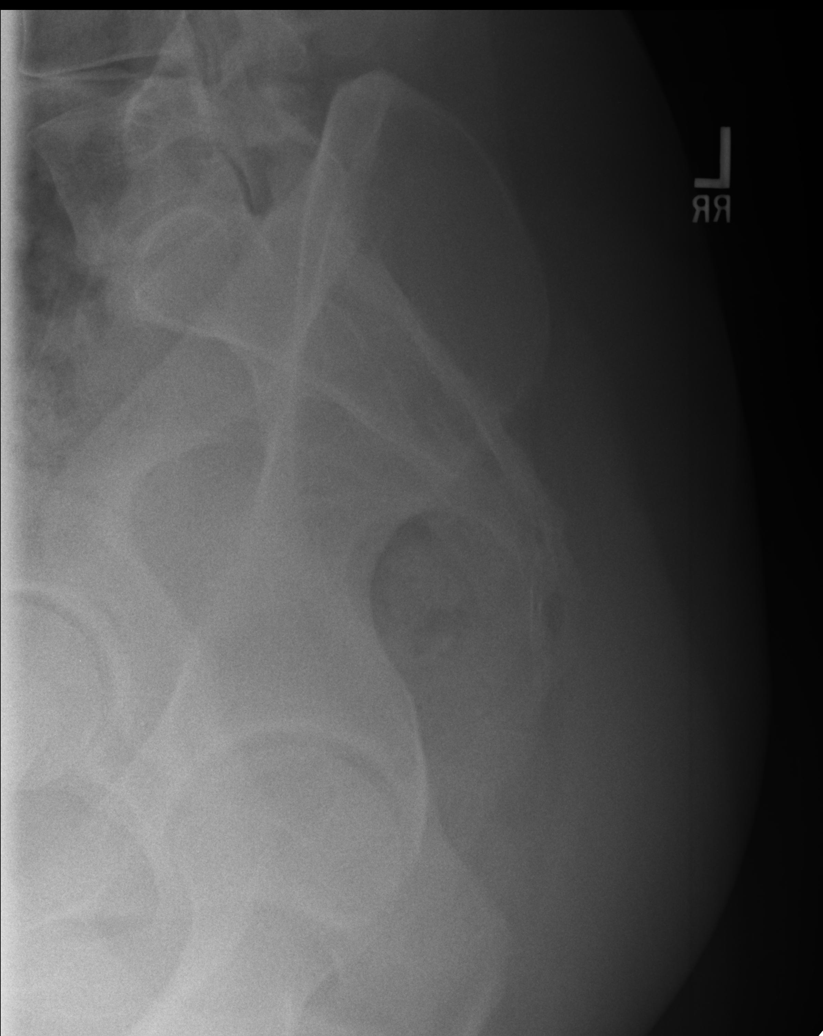

[5 of 5 positions shown; findings below may reference images not displayed]

FINDINGS: Five lumbar type vertebral bodies are normal in height and
alignment. No acute fracture, pars defect, or suspicious bony
abnormality is identified. Disc spaces are preserved. Sacroiliac
joints are unremarkable. Visualized bowel gas pattern is normal.
IMPRESSION: No acute bony abnormality or significant degenerative

## 2015-07-14 ENCOUNTER — Encounter: Payer: Self-pay | Admitting: Family Medicine

## 2015-07-14 ENCOUNTER — Ambulatory Visit (INDEPENDENT_AMBULATORY_CARE_PROVIDER_SITE_OTHER): Payer: BLUE CROSS/BLUE SHIELD | Admitting: Family Medicine

## 2015-07-14 VITALS — BP 139/89 | HR 92 | Temp 98.2°F | Resp 16 | Ht 69.5 in | Wt 257.0 lb

## 2015-07-14 DIAGNOSIS — Z1329 Encounter for screening for other suspected endocrine disorder: Secondary | ICD-10-CM | POA: Diagnosis not present

## 2015-07-14 DIAGNOSIS — Z23 Encounter for immunization: Secondary | ICD-10-CM | POA: Diagnosis not present

## 2015-07-14 DIAGNOSIS — E663 Overweight: Secondary | ICD-10-CM

## 2015-07-14 DIAGNOSIS — E785 Hyperlipidemia, unspecified: Secondary | ICD-10-CM | POA: Diagnosis not present

## 2015-07-14 DIAGNOSIS — Z0000 Encounter for general adult medical examination without abnormal findings: Secondary | ICD-10-CM | POA: Diagnosis not present

## 2015-07-14 DIAGNOSIS — N62 Hypertrophy of breast: Secondary | ICD-10-CM

## 2015-07-14 DIAGNOSIS — F411 Generalized anxiety disorder: Secondary | ICD-10-CM | POA: Diagnosis not present

## 2015-07-14 DIAGNOSIS — Z124 Encounter for screening for malignant neoplasm of cervix: Secondary | ICD-10-CM

## 2015-07-14 DIAGNOSIS — Z119 Encounter for screening for infectious and parasitic diseases, unspecified: Secondary | ICD-10-CM | POA: Diagnosis not present

## 2015-07-14 DIAGNOSIS — Z130 Encounter for screening for diseases of the blood and blood-forming organs and certain disorders involving the immune mechanism: Secondary | ICD-10-CM | POA: Diagnosis not present

## 2015-07-14 LAB — COMPREHENSIVE METABOLIC PANEL
ALT: 16 U/L (ref 6–29)
AST: 15 U/L (ref 10–30)
Albumin: 4.1 g/dL (ref 3.6–5.1)
Alkaline Phosphatase: 56 U/L (ref 33–115)
BILIRUBIN TOTAL: 0.4 mg/dL (ref 0.2–1.2)
BUN: 17 mg/dL (ref 7–25)
CALCIUM: 9.1 mg/dL (ref 8.6–10.2)
CHLORIDE: 101 mmol/L (ref 98–110)
CO2: 25 mmol/L (ref 20–31)
Creat: 0.54 mg/dL (ref 0.50–1.10)
GLUCOSE: 88 mg/dL (ref 65–99)
Potassium: 4.2 mmol/L (ref 3.5–5.3)
SODIUM: 135 mmol/L (ref 135–146)
TOTAL PROTEIN: 7.1 g/dL (ref 6.1–8.1)

## 2015-07-14 LAB — CBC
HCT: 39.5 % (ref 36.0–46.0)
Hemoglobin: 13.5 g/dL (ref 12.0–15.0)
MCH: 29.3 pg (ref 26.0–34.0)
MCHC: 34.2 g/dL (ref 30.0–36.0)
MCV: 85.7 fL (ref 78.0–100.0)
MPV: 9.3 fL (ref 8.6–12.4)
PLATELETS: 351 10*3/uL (ref 150–400)
RBC: 4.61 MIL/uL (ref 3.87–5.11)
RDW: 13.3 % (ref 11.5–15.5)
WBC: 7.7 10*3/uL (ref 4.0–10.5)

## 2015-07-14 LAB — LIPID PANEL
Cholesterol: 179 mg/dL (ref 125–200)
HDL: 36 mg/dL — ABNORMAL LOW (ref >=46)
LDL Cholesterol: 115 mg/dL (ref <130)
Total CHOL/HDL Ratio: 5 Ratio (ref <=5.0)
Triglycerides: 139 mg/dL (ref <150)
VLDL: 28 mg/dL (ref <30)

## 2015-07-14 LAB — HIV ANTIBODY (ROUTINE TESTING W REFLEX): HIV 1&2 Ab, 4th Generation: NONREACTIVE

## 2015-07-14 LAB — TSH: TSH: 0.828 u[IU]/mL (ref 0.350–4.500)

## 2015-07-14 MED ORDER — ALPRAZOLAM 0.5 MG PO TABS: 0.2500 mg | ORAL_TABLET | Freq: Two times a day (BID) | ORAL | Status: DC | PRN

## 2015-07-14 MED ORDER — ALPRAZOLAM 0.5 MG PO TABS: 0.5000 mg | ORAL_TABLET | Freq: Two times a day (BID) | ORAL | Status: DC | PRN

## 2015-07-14 NOTE — Progress Notes (Signed)
[facility] [address] [place] [address] [phone]  Date:  07/14/2015   Name:  Rose Cortez   DOB:  12-11-1986   MRN:  [id number]  PCP:  Pcp Not In System    Chief Complaint: Annual Exam and Gynecologic Exam   History of Present Illness:  Rose Cortez is a 29 y.o. very pleasant female patient who presents with the following:  Here today seeking a CPE as well as a pap.  History of anxiety, dyslipidemia Her last pap was a couple of years ago- no history of abnl pap in the past  She is fasting today for labs.  She is working on being healthier, but is frustrated by difficulty with weight loss.  She feels like she is doing a good job with her lifestyle but is not losing any weight.  She has changed her diet and is getting more exercise by walking  She has a family history of hyperlipidemia She has always had very large breasts, and they do hold her back from exercise.  She would like to try breast reduction but she is not sure when she would be able to get this done.  She is working full time as a Runner, broadcasting/film/video- pre-school  She takes xanax 0.5 at times for anxiety- she may take this twice a week or so. This helps her with her anxiety along with her SSRI She is also on medication for her cholesterol  LMP 7/28  Wt Readings from Last 3 Encounters:  07/14/15 257 lb (116.574 kg)  04/14/15 253 lb (114.76 kg)  02/18/15 254 lb (115.214 kg)     Patient Active Problem List   Diagnosis Date Noted  . GAD (generalized anxiety disorder) 04/14/2015  . Obesity 04/14/2015  . Dyslipidemia 04/14/2015    Past Medical History  Diagnosis Date  . Hyperlipidemia   . Depression   . Anxiety     History reviewed. No pertinent past surgical history.  History  Substance Use Topics  . Smoking status: Never Smoker   . Smokeless tobacco: Not on file  . Alcohol Use: Yes     Comment: weekly    Family History  Problem Relation Age of Onset  . Hyperlipidemia Mother   .  Hyperlipidemia Father   . Depression Sister     No Known Allergies  Medication list has been reviewed and updated.  Current Outpatient Prescriptions on File Prior to Visit  Medication Sig Dispense Refill  . ALPRAZolam (XANAX) 0.25 MG tablet Take 1 tablet (0.25 mg total) by mouth 2 (two) times daily as needed for anxiety. 20 tablet 0  . cholecalciferol (VITAMIN D) 1000 UNITS tablet Take 5,000 Units by mouth daily.    . citalopram (CELEXA) 20 MG tablet Take 1 tablet (20 mg total) by mouth daily. 90 tablet 3  . Fish Oil OIL by Does not apply route.    . Melatonin 5 MG TABS Take by mouth.    . simvastatin (ZOCOR) 40 MG tablet Take 1 tablet (40 mg total) by mouth daily. 90 tablet 3   No current facility-administered medications on file prior to visit.    Review of Systems:  As per HPI- otherwise negative.   Physical Examination: Filed Vitals:   07/14/15 0811  BP: 139/89  Pulse: 92  Temp: 98.2 F (36.8 C)  Resp: 16   Filed Vitals:   07/14/15 0811  Height: 5' 9.5" (1.765 m)  Weight: 257 lb (116.574 kg)   Body mass index  is 37.42 kg/(m^2). Ideal Body Weight: Weight in (lb) to have BMI = 25: 171.4  GEN: WDWN, NAD, Non-toxic, A & O x 3, obese, looks well HEENT: Atraumatic, Normocephalic. Neck supple. No masses, No LAD.  Bilateral TM wnl, oropharynx normal.  PEERL,EOMI.   Ears and Nose: No external deformity. CV: RRR, No M/G/R. No JVD. No thrill. No extra heart sounds. PULM: CTA B, no wheezes, crackles, rhonchi. No retractions. No resp. distress. No accessory muscle use. ABD: S, NT, ND. No rebound. No HSM. EXTR: No c/c/e NEURO Normal gait.  PSYCH: Normally interactive. Conversant. Not depressed or anxious appearing.  Calm demeanor.  Breast: normal exam, no masses/ dimpling/ discharge. She has very large breasts Pelvic: normal, no vaginal lesions or discharge. Uterus normal, no CMT, no adnexal tendereness or masses  Assessment and Plan: Physical exam  Need for Tdap  vaccination - Plan: Tdap vaccine greater than or equal to 7yo IM  Screening for cervical cancer - Plan: Pap IG, CT/NG w/ reflex HPV when ASC-U  Screening examination for infectious disease - Plan: HIV antibody  Hyperlipidemia - Plan: Comprehensive metabolic panel, Lipid panel  Screening for deficiency anemia - Plan: CBC  Screening for hypothyroidism - Plan: TSH  GAD (generalized anxiety disorder) - Plan: ALPRAZolam (XANAX) 0.5 MG tablet, DISCONTINUED: ALPRAZolam (XANAX) 0.5 MG tablet  Large breasts  Overweight   Physical exam and labs as above She is trying to lose weight but not having much success.  Referral to nutritionist Refilled her xanax for occasional use Await labs and pap  Signed Abbe Amsterdam, MD

## 2015-07-14 NOTE — Patient Instructions (Signed)
Good to see you today!   I will be in touch with your labs and pap We will set you up to speak to a nutrition specialist;  good job so far with your healthy diet and exercise efforts  Continue to use xanax as needed; however remember to use as infrequently as possible as it is habit forming  If you would like to see plastic surgeon about a possible breast reduction at some point just let me know

## 2015-07-16 ENCOUNTER — Encounter: Payer: Self-pay | Admitting: Family Medicine

## 2015-07-16 LAB — PAP IG, CT-NG, RFX HPV ASCU
CHLAMYDIA PROBE AMP: NEGATIVE
GC Probe Amp: NEGATIVE

## 2015-07-29 ENCOUNTER — Telehealth: Payer: Self-pay

## 2015-07-29 NOTE — Telephone Encounter (Signed)
Pt called about labs. Letter was sent. LMOM of info

## 2016-01-29 ENCOUNTER — Encounter: Payer: Self-pay | Admitting: Family Medicine

## 2016-01-29 ENCOUNTER — Ambulatory Visit (INDEPENDENT_AMBULATORY_CARE_PROVIDER_SITE_OTHER): Payer: BLUE CROSS/BLUE SHIELD | Admitting: Family Medicine

## 2016-01-29 VITALS — BP 120/80 | HR 102 | Temp 98.5°F | Resp 16 | Ht 68.0 in | Wt 254.8 lb

## 2016-01-29 DIAGNOSIS — E669 Obesity, unspecified: Secondary | ICD-10-CM | POA: Diagnosis not present

## 2016-01-29 DIAGNOSIS — L739 Follicular disorder, unspecified: Secondary | ICD-10-CM

## 2016-01-29 DIAGNOSIS — F411 Generalized anxiety disorder: Secondary | ICD-10-CM

## 2016-01-29 DIAGNOSIS — R21 Rash and other nonspecific skin eruption: Secondary | ICD-10-CM | POA: Diagnosis not present

## 2016-01-29 DIAGNOSIS — F0630 Mood disorder due to known physiological condition, unspecified: Secondary | ICD-10-CM

## 2016-01-29 DIAGNOSIS — R41840 Attention and concentration deficit: Secondary | ICD-10-CM | POA: Diagnosis not present

## 2016-01-29 DIAGNOSIS — E785 Hyperlipidemia, unspecified: Secondary | ICD-10-CM

## 2016-01-29 MED ORDER — NYSTATIN 100000 UNIT/GM EX POWD: 5.0000 g | Freq: Three times a day (TID) | CUTANEOUS | Status: DC

## 2016-01-29 MED ORDER — DOXYCYCLINE HYCLATE 100 MG PO CAPS: 100.0000 mg | ORAL_CAPSULE | Freq: Two times a day (BID) | ORAL | Status: DC

## 2016-01-29 MED ORDER — MUPIROCIN 2 % EX OINT: 1.0000 "application " | TOPICAL_OINTMENT | Freq: Four times a day (QID) | CUTANEOUS | Status: DC

## 2016-01-29 NOTE — Progress Notes (Signed)
Subjective:    Patient ID: Rose Cortez, female    DOB: December 16, 1985, 30 y.o.   MRN: [id number] Chief Complaint  Patient presents with  . boil of left breast    x 2 days  . ADD    ASRS questionaire has been done on this ofc vs  . consult regarding current med pt is taking     HPI  Has had long standing depression/anxiety sxs which are getting worse. She feels that her sxs are often exacerbated by her failure to accomplish tasks, remember things, push herself.  Leaves things undone.  Her father has ADD and has been on stimulant therapy for this for a long time and pt's friends and family have been suggesting to her that she seems to have many ADD sxs and concentration problems. She also continues to gain weight and realizes that this is a problem for her way out of control. Was hoping a stimulant med would help kick off a weight loss journey as well. 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  Occ gets deep boils that are painful and drain - has on the side of her breast now. Has not required an I&D prior or any treatment for these prior.  Past Medical History  Diagnosis Date  . Hyperlipidemia   . Depression   . Anxiety    No past surgical history on file. Current Outpatient Prescriptions on File Prior to Visit  Medication Sig Dispense Refill  . Cetirizine HCl (ZYRTEC ALLERGY) 10 MG CAPS Take by mouth.    . citalopram (CELEXA) 20 MG tablet Take 1 tablet (20 mg total) by mouth daily. 90 tablet 3  . Fish Oil OIL by Does not apply route.    . simvastatin (ZOCOR) 40 MG tablet Take 1 tablet (40 mg total) by mouth daily. 90 tablet 3  . cholecalciferol (VITAMIN D) 1000 UNITS tablet Take 5,000 Units by mouth daily.    . Melatonin 5 MG TABS Take by mouth. Reported on 01/29/2016     No current facility-administered medications on file prior to visit.   No Known Allergies Family History  Problem Relation  Age of Onset  . Hyperlipidemia Mother   . Hyperlipidemia Father   . Depression Sister    Social History   Social History  . Marital Status: Single    Spouse Name: N/A  . Number of Children: N/A  . Years of Education: N/A   Social History Main Topics  . Smoking status: Never Smoker   . Smokeless tobacco: None  . Alcohol Use: Yes     Comment: weekly  . Drug Use: No  . Sexual Activity: Yes   Other Topics Concern  . None   Social History Narrative   Depression screen [facility] 2/9 01/29/2016 07/14/2015  Decreased Interest 0 0  Down, Depressed, Hopeless 0 0  PHQ - 2 Score 0 0      Review of Systems  Constitutional: Positive for fatigue and unexpected weight change (gain). Negative for fever, chills, diaphoresis, activity change and appetite change.  Respiratory: Negative for chest tightness.   Cardiovascular: Negative for chest pain and palpitations.  Gastrointestinal: Negative for nausea and vomiting.  Musculoskeletal: Negative for myalgias, joint swelling and arthralgias.  Skin: Positive for color change, rash and wound.  Hematological: Negative for adenopathy.  Psychiatric/Behavioral: Positive for behavioral problems and decreased concentration. Negative for confusion, sleep disturbance, dysphoric mood and agitation. The  patient is nervous/anxious. The patient is not hyperactive.        Objective:  BP 120/90 mmHg  Pulse 113  Temp(Src) 98.5 F (36.9 C) (Oral)  Resp 16  Ht  (1.727 m)  Wt 254 lb 12.8 oz (115.577 kg)  BMI 38.75 kg/m2  SpO2 98%  LMP 01/05/2016  Recheck BP 120/80, pulse 102  Physical Exam  Constitutional: She is oriented to person, place, and time. She appears well-developed and well-nourished. No distress.  HENT:  Head: Normocephalic and atraumatic.  Right Ear: External ear normal.  Left Ear: External ear normal.  Eyes: Conjunctivae are normal. No scleral icterus.  Neck: Normal range of motion. Neck supple. No thyromegaly present.    Cardiovascular: Normal rate, regular rhythm, normal heart sounds and intact distal pulses.   Pulmonary/Chest: Effort normal and breath sounds normal. No respiratory distress.  Musculoskeletal: She exhibits no edema.  Lymphadenopathy:    She has no cervical adenopathy.  Neurological: She is alert and oriented to person, place, and time.  Skin: Skin is warm and dry. Lesion and rash noted. Rash is pustular. She is not diaphoretic. There is erythema.  Psychiatric: She has a normal mood and affect. Her behavior is normal.          Assessment & Plan:   1. Folliculitis   2. 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 Dyslipidemia   5. Obesity   6. GAD (generalized anxiety disorder)   7. Rash and nonspecific skin eruption - is draining well and more like nodular acne than deep abscess - warm compresses sev x/d followed by bactroban - several lesions in bilateral axilla so cover with oral doxy and may want to try nasal mupirocin and hibiclens washes if sxs cont.    Orders Placed This Encounter  Procedures  . Ambulatory referral to Dermatology    Referral Priority:  Routine    Referral Type:  Consultation    Referral Reason:  Specialty Services Required    Requested Specialty:  Dermatology    Number of Visits Requested:  1    Meds ordered this encounter  Medications  . doxycycline (VIBRAMYCIN) 100 MG capsule    Sig: Take 1 capsule (100 mg total) by mouth 2 (two) times daily. x1 wk, then qd    Dispense:  67 capsule    Refill:  0  . mupirocin ointment (BACTROBAN) 2 %    Sig: Apply 1 application topically 4 (four) times daily. To pustules. Apply into your nares qhs x 5 night    Dispense:  30 g    Refill:  1  . nystatin  (MYCOSTATIN/NYSTOP) 100000 UNIT/GM POWD    Sig: Apply 5 g topically 3 (three) times daily.    Dispense:  60 g    Refill:  2    Norberto Sorenson, MD MPH

## 2016-01-29 NOTE — Patient Instructions (Addendum)
[facility]  [address], [place], [place] [address]  [phone]  [facility]  Address: [address], [place], [place] [address]  [phone]  [facility]  Address: [address] [place], [place] [address]  [phone]  [facility] [address] [place], [id number] Phone: [phone]  [facility] also does a lot of diagnostic testing and therapy which they are fabulous at but they do not prescribe medication so you will still need to establish with another provider that prescribes medication. Some family physicians do this, but I do not feel comfortable starting people on medications that are permanent and going to change their personality and have such a high risk profile for abuse, dependence, and tolerance without specialist insight and guidance.   I do think there is a very high likelihood that you have undiagnosed ADD as an aspect of make-up - i think you choose well in your profession.  I would encourage you to make sure you pursue cognitive and behavioral therapy for techniques to lean to live with this as well as any medication therapy - I do think that you would have a lot of positive energy and approaches that the young children could learn from you as our current social and technological society is so fractured.  If you start getting pustules again - hibiclens wash as a body wash for several days could nip it in the bud.  [facility] has a new weight loss clinic in [place] on [address]. So far, they seem to be getting good results and since it is medically based your insurance might provide better coverage.  It is probably worth checking to see what your ins coverage is. It is called [employer] and you can call [phone] option 1 for more info.   Folliculitis Folliculitis is redness, soreness, and  swelling (inflammation) of the hair follicles. This condition can occur anywhere on the body. People with weakened immune systems, diabetes, or obesity have a greater risk of getting folliculitis. CAUSES  Bacterial infection. This is the most common cause.  Fungal infection.  Viral infection.  Contact with certain chemicals, especially oils and tars. Long-term folliculitis can result from bacteria that live in the nostrils. The bacteria may trigger multiple outbreaks of folliculitis over time. SYMPTOMS Folliculitis most commonly occurs on the scalp, thighs, legs, back, buttocks, and areas where hair is shaved frequently. An early sign of folliculitis is a small, white or yellow, pus-filled, itchy lesion (pustule). These lesions appear on a red, inflamed follicle. They are usually less than 0.2 inches (5 mm) wide. When there is an infection of the follicle that goes deeper, it becomes a boil or furuncle. A group of closely packed boils creates a larger lesion (carbuncle). Carbuncles tend to occur in hairy, sweaty areas of the body. DIAGNOSIS  Your caregiver can usually tell what is wrong by doing a physical exam. A sample may be taken from one of the lesions and tested in a lab. This can help determine what is causing your folliculitis. TREATMENT  Treatment may include:  Applying warm compresses to the affected areas.  Taking antibiotic medicines orally or applying them to the skin.  Draining the lesions if they contain a large amount of pus or fluid.  Laser hair removal for cases of long-lasting folliculitis. This helps to prevent regrowth of the hair. HOME CARE INSTRUCTIONS  Apply warm compresses to the affected areas as  directed by your caregiver.  If antibiotics are prescribed, take them as directed. Finish them even if you start to feel better.  You may take over-the-counter medicines to relieve itching.  Do not shave irritated skin.  Follow up with your caregiver as  directed. SEEK IMMEDIATE MEDICAL CARE IF:   You have increasing redness, swelling, or pain in the affected area.  You have a fever. MAKE SURE YOU:  Understand these instructions.  Will watch your condition.  Will get help right away if you are not doing well or get worse.   This information is not intended to replace advice given to you by your health care provider. Make sure you discuss any questions you have with your health care provider.   Document Released: 02/07/2002 Document Revised: 12/20/2014 Document Reviewed: 02/29/2012 Elsevier Interactive Patient Education 2016 [employer]. Attention Deficit Hyperactivity Disorder Attention deficit hyperactivity disorder (ADHD) is a problem with behavior issues based on the way the brain functions (neurobehavioral disorder). It is a common reason for behavior and academic problems in school. SYMPTOMS  There are 3 types of ADHD. The 3 types and some of the symptoms include:  Inattentive.  Gets bored or distracted easily.  Loses or forgets things. Forgets to hand in homework.  Has trouble organizing or completing tasks.  Difficulty staying on task.  An inability to organize daily tasks and school work.  Leaving projects, chores, or homework unfinished.  Trouble paying attention or responding to details. Careless mistakes.  Difficulty following directions. Often seems like is not listening.  Dislikes activities that require sustained attention (like chores or homework).  Hyperactive-impulsive.  Feels like it is impossible to sit still or stay in a seat. Fidgeting with hands and feet.  Trouble waiting turn.  Talking too much or out of turn. Interruptive.  Speaks or acts impulsively.  Aggressive, disruptive behavior.  Constantly busy or on the go; noisy.  Often leaves seat when they are expected to remain seated.  Often runs or climbs where it is not appropriate, or feels very restless.  Combined.  Has symptoms  of both of the above. Often children with ADHD feel discouraged about themselves and with school. They often perform well below their abilities in school. As children get older, the excess motor activities can calm down, but the problems with paying attention and staying organized persist. Most children do not outgrow ADHD but with good treatment can learn to cope with the symptoms. DIAGNOSIS  When ADHD is suspected, the diagnosis should be made by professionals trained in ADHD. This professional will collect information about the individual suspected of having ADHD. Information must be collected from various settings where the person lives, works, or attends school.  Diagnosis will include:  Confirming symptoms began in childhood.  Ruling out other reasons for the child's behavior.  The health care providers will check with the child's school and check their medical records.  They will talk to teachers and parents.  Behavior rating scales for the child will be filled out by those dealing with the child on a daily basis. A diagnosis is made only after all information has been considered. TREATMENT  Treatment usually includes behavioral treatment, tutoring or extra support in school, and stimulant medicines. Because of the way a person's brain works with ADHD, these medicines decrease impulsivity and hyperactivity and increase attention. This is different than how they would work in a person who does not have ADHD. Other medicines used include antidepressants and certain blood pressure medicines.  Most experts agree that treatment for ADHD should address all aspects of the person's functioning. Along with medicines, treatment should include structured classroom management at school. Parents should reward good behavior, provide constant discipline, and set limits. Tutoring should be available for the child as needed. ADHD is a lifelong condition. If untreated, the disorder can have long-term  serious effects into adolescence and adulthood. HOME CARE INSTRUCTIONS   Often with ADHD there is a lot of frustration among family members dealing with the condition. Blame and anger are also feelings that are common. In many cases, because the problem affects the family as a whole, the entire family may need help. A therapist can help the family find better ways to handle the disruptive behaviors of the person with ADHD and promote change. If the person with ADHD is young, most of the therapist's work is with the parents. Parents will learn techniques for coping with and improving their child's behavior. Sometimes only the child with the ADHD needs counseling. Your health care providers can help you make these decisions.  Children with ADHD may need help learning how to organize. Some helpful tips include:  Keep routines the same every day from wake-up time to bedtime. Schedule all activities, including homework and playtime. Keep the schedule in a place where the person with ADHD will often see it. Mark schedule changes as far in advance as possible.  Schedule outdoor and indoor recreation.  Have a place for everything and keep everything in its place. This includes clothing, backpacks, and school supplies.  Encourage writing down assignments and bringing home needed books. Work with your child's teachers for assistance in organizing school work.  Offer your child a well-balanced diet. Breakfast that includes a balance of whole grains, protein, and fruits or vegetables is especially important for school performance. Children should avoid drinks with caffeine including:  Soft drinks.  Coffee.  Tea.  However, some older children (adolescents) may find these drinks helpful in improving their attention. Because it can also be common for adolescents with ADHD to become addicted to caffeine, talk with your health care provider about what is a safe amount of caffeine intake for your  child.  Children with ADHD need consistent rules that they can understand and follow. If rules are followed, give small rewards. Children with ADHD often receive, and expect, criticism. Look for good behavior and praise it. Set realistic goals. Give clear instructions. Look for activities that can foster success and self-esteem. Make time for pleasant activities with your child. Give lots of affection.  Parents are their children's greatest advocates. Learn as much as possible about ADHD. This helps you become a stronger and better advocate for your child. It also helps you educate your child's teachers and instructors if they feel inadequate in these areas. Parent support groups are often helpful. A national group with local chapters is called Children and Adults with Attention Deficit Hyperactivity Disorder (CHADD). SEEK MEDICAL CARE IF:  Your child has repeated muscle twitches, cough, or speech outbursts.  Your child has sleep problems.  Your child has a marked loss of appetite.  Your child develops depression.  Your child has new or worsening behavioral problems.  Your child develops dizziness.  Your child has a racing heart.  Your child has stomach pains.  Your child develops headaches. SEEK IMMEDIATE MEDICAL CARE IF:  Your child has been diagnosed with depression or anxiety and the symptoms seem to be getting worse.  Your child has been depressed and  suddenly appears to have increased energy or motivation.  You are worried that your child is having a bad reaction to a medication he or she is taking for ADHD.   This information is not intended to replace advice given to you by your health care provider. Make sure you discuss any questions you have with your health care provider.   Document Released: 11/19/2002 Document Revised: 12/04/2013 Document Reviewed: 08/06/2013 Elsevier Interactive Patient Education [employer]

## 2016-04-05 ENCOUNTER — Other Ambulatory Visit: Payer: Self-pay | Admitting: Family Medicine

## 2016-05-17 ENCOUNTER — Other Ambulatory Visit: Payer: Self-pay | Admitting: Family Medicine

## 2016-05-27 ENCOUNTER — Encounter: Payer: Self-pay | Admitting: Family Medicine

## 2016-05-27 ENCOUNTER — Ambulatory Visit (INDEPENDENT_AMBULATORY_CARE_PROVIDER_SITE_OTHER): Payer: 59 | Admitting: Family Medicine

## 2016-05-27 VITALS — BP 135/88 | HR 114 | Temp 98.4°F | Resp 16 | Ht 68.0 in | Wt 266.0 lb

## 2016-05-27 DIAGNOSIS — Z136 Encounter for screening for cardiovascular disorders: Secondary | ICD-10-CM | POA: Diagnosis not present

## 2016-05-27 DIAGNOSIS — Z130 Encounter for screening for diseases of the blood and blood-forming organs and certain disorders involving the immune mechanism: Secondary | ICD-10-CM | POA: Diagnosis not present

## 2016-05-27 DIAGNOSIS — F411 Generalized anxiety disorder: Secondary | ICD-10-CM

## 2016-05-27 DIAGNOSIS — IMO0001 Reserved for inherently not codable concepts without codable children: Secondary | ICD-10-CM

## 2016-05-27 DIAGNOSIS — Z124 Encounter for screening for malignant neoplasm of cervix: Secondary | ICD-10-CM | POA: Diagnosis not present

## 2016-05-27 DIAGNOSIS — Z1329 Encounter for screening for other suspected endocrine disorder: Secondary | ICD-10-CM | POA: Diagnosis not present

## 2016-05-27 DIAGNOSIS — Z1389 Encounter for screening for other disorder: Secondary | ICD-10-CM

## 2016-05-27 DIAGNOSIS — E669 Obesity, unspecified: Secondary | ICD-10-CM

## 2016-05-27 DIAGNOSIS — R030 Elevated blood-pressure reading, without diagnosis of hypertension: Secondary | ICD-10-CM | POA: Diagnosis not present

## 2016-05-27 DIAGNOSIS — Z1383 Encounter for screening for respiratory disorder NEC: Secondary | ICD-10-CM

## 2016-05-27 DIAGNOSIS — Z113 Encounter for screening for infections with a predominantly sexual mode of transmission: Secondary | ICD-10-CM

## 2016-05-27 DIAGNOSIS — Z0000 Encounter for general adult medical examination without abnormal findings: Secondary | ICD-10-CM

## 2016-05-27 DIAGNOSIS — E785 Hyperlipidemia, unspecified: Secondary | ICD-10-CM

## 2016-05-27 LAB — CBC
HEMATOCRIT: 41.8 % (ref 35.0–45.0)
HEMOGLOBIN: 14.2 g/dL (ref 11.7–15.5)
MCH: 29.8 pg (ref 27.0–33.0)
MCHC: 34 g/dL (ref 32.0–36.0)
MCV: 87.6 fL (ref 80.0–100.0)
MPV: 9.7 fL (ref 7.5–12.5)
Platelets: 364 10*3/uL (ref 140–400)
RBC: 4.77 MIL/uL (ref 3.80–5.10)
RDW: 13.6 % (ref 11.0–15.0)
WBC: 5.6 10*3/uL (ref 3.8–10.8)

## 2016-05-27 LAB — POCT URINALYSIS DIP (MANUAL ENTRY)
Bilirubin, UA: NEGATIVE
GLUCOSE UA: NEGATIVE
Ketones, POC UA: NEGATIVE
Leukocytes, UA: NEGATIVE
NITRITE UA: NEGATIVE
PROTEIN UA: NEGATIVE
RBC UA: NEGATIVE
SPEC GRAV UA: 1.01
Urobilinogen, UA: 0.2
pH, UA: 6.5

## 2016-05-27 LAB — TSH: TSH: 1.27 m[IU]/L

## 2016-05-27 MED ORDER — BUPROPION HCL ER (XL) 150 MG PO TB24: ORAL_TABLET | ORAL | Status: DC

## 2016-05-27 MED ORDER — PROPRANOLOL HCL ER 80 MG PO CP24: 80.0000 mg | ORAL_CAPSULE | Freq: Every day | ORAL | Status: DC

## 2016-05-27 MED ORDER — SIMVASTATIN 40 MG PO TABS: 40.0000 mg | ORAL_TABLET | Freq: Every day | ORAL | Status: DC

## 2016-05-27 MED ORDER — CITALOPRAM HYDROBROMIDE 20 MG PO TABS: ORAL_TABLET | ORAL | Status: DC

## 2016-05-27 NOTE — Progress Notes (Signed)
Subjective:    Patient ID: Rose Cortez, female    DOB: 18-Oct-1986, 30 y.o.   MRN: [id number] Chief Complaint  Patient presents with  . Annual Exam    no pap    HPI  Rose Cortez is a delightful 30 yo woman here for her full physical.  I last saw her 4 mos prior.   Mood d/o: At that time, she was having some long-standing mood sxs and had been suspicious that she might have untreated ADD due to family hx and current work frustrations.  I suggested she seek formal testing with a counselor/psychologist. Has been using xanax 0.5mg  prn - just 2-3x/wk along with citalopram 20  Gets recurrent deep painful boils - largely in axilla.  Last seen for her CPE 07/2015 by a colleague- had nml labs at that time inc and neg/normal pap  Obesity - gets frustrated with diet as does not see results, large breasts keep her from exercising to much. Was referred to nutritionist last yr HPL: On zocor 40 for years  Works full-time as a Designer, television/film setMontessori preschool teacher  Primary Prevention: Cervical ca screen: Sev pap smears, no h/o abnml, last done 07/2015 Immunizations: TDaP done 2016 Vit/otc/herbal: on vit D?  Person training SVT - was on atenolol when she was young but now that she has started exercising again she has noticed episodes of palpitations.  Has fluttering of several beats-severtal minutes.  She squiats to stop it.   Exercise triggers it so now that she is trying to do cardio - it is getting worse.  Mood depressed/overwhelmed, gets anxiety with mind spinning out of control. Has not been able to  Sleeping fine Has been gardening some. Walks.  Not haivng much worse.  Was on zoloft in high school. Doesn't remember why she went off of it.  In college she went on paxil and had a hard time coming off of it.  Has been on citalopram for 3 years and increased to 20mg  last yr.  Does get some panic/anx attacks when in big groups.  Has been on xanax before and didn't find it effective at all.  Past Medical  History  Diagnosis Date  . Hyperlipidemia   . Depression   . Anxiety    No past surgical history on file. Current Outpatient Prescriptions on File Prior to Visit  Medication Sig Dispense Refill  . Cetirizine HCl (ZYRTEC ALLERGY) 10 MG CAPS Take by mouth.    . cholecalciferol (VITAMIN D) 1000 UNITS tablet Take 5,000 Units by mouth daily.    . Fish Oil OIL by Does not apply route.    . Melatonin 5 MG TABS Take by mouth. Reported on 05/27/2016    . nystatin (MYCOSTATIN/NYSTOP) 100000 UNIT/GM POWD Apply 5 g topically 3 (three) times daily. (Patient not taking: Reported on 05/27/2016) 60 g 2   No current facility-administered medications on file prior to visit.   Not on File Family History  Problem Relation Age of Onset  . Hyperlipidemia Mother   . Hyperlipidemia Father   . Depression Sister    Social History   Social History  . Marital Status: Single    Spouse Name: N/A  . Number of Children: N/A  . Years of Education: N/A   Social History Main Topics  . Smoking status: Never Smoker   . Smokeless tobacco: None  . Alcohol Use: Yes     Comment: weekly  . Drug Use: No  . Sexual Activity: Yes   Other Topics Concern  .  None   Social History Narrative    Review of Systems  All other systems reviewed and are negative.      Objective:  BP 135/88 mmHg  Pulse 114  Temp(Src) 98.4 F (36.9 C) (Oral)  Resp 16  Ht 5\' 8"  (1.727 m)  Wt 266 lb (120.657 kg)  BMI 40.45 kg/m2  LMP 05/07/2016  Physical Exam  Constitutional: She is oriented to person, place, and time. She appears well-developed and well-nourished. No distress.  HENT:  Head: Normocephalic and atraumatic.  Right Ear: Tympanic membrane, external ear and ear canal normal.  Left Ear: Tympanic membrane, external ear and ear canal normal.  Nose: Nose normal. No mucosal edema or rhinorrhea.  Mouth/Throat: Uvula is midline, oropharynx is clear and moist and mucous membranes are normal. No posterior oropharyngeal  erythema.  Eyes: Conjunctivae and EOM are normal. Pupils are equal, round, and reactive to light. Right eye exhibits no discharge. Left eye exhibits no discharge. No scleral icterus.  Neck: Normal range of motion. Neck supple. No thyromegaly present.  Cardiovascular: Normal rate, regular rhythm, normal heart sounds and intact distal pulses.   Pulmonary/Chest: Effort normal and breath sounds normal. No respiratory distress.  Abdominal: Soft. Bowel sounds are normal. There is no tenderness.  Genitourinary: No breast swelling, tenderness, discharge or bleeding.  Musculoskeletal: She exhibits no edema.  Lymphadenopathy:    She has no cervical adenopathy.  Neurological: She is alert and oriented to person, place, and time. She has normal reflexes.  Skin: Skin is warm and dry. She is not diaphoretic. No erythema.  Psychiatric: She has a normal mood and affect. Her behavior is normal.          Assessment & Plan:   1. Annual physical exam   2. Routine screening for STI (sexually transmitted infection)   3. Screening for cardiovascular, respiratory, and genitourinary diseases   4. Screening for cervical cancer   5. Screening for deficiency anemia   6. Screening for thyroid disorder   7. Obesity   8. Dyslipidemia -Ran out of simvastatin sev wks ago - has been on for 6 yrs. has only been off simvastatin for a few wks and lipid panel already dramatically worse so will have pt continue. If at some point, she is able to loose weight and adopt a healthier lifestyle is might be worth having her come off the statin for 6 mos to get a new baseline or even consider a more complete liposcience profile breakdown.  9. GAD (generalized anxiety disorder)   10. Elevated blood pressure   Start trial of wellbutrin and propranolol to help with weight loss, ADD sx, anxiety. Cont citalopram 10  Orders Placed This Encounter  Procedures  . CBC  . Comprehensive metabolic panel    Order Specific Question:  Has  the patient fasted?    Answer:  Yes  . TSH  . Lipid panel    Order Specific Question:  Has the patient fasted?    Answer:  Yes  . POCT urinalysis dipstick    Meds ordered this encounter  Medications  . simvastatin (ZOCOR) 40 MG tablet    Sig: Take 1 tablet (40 mg total) by mouth daily.    Dispense:  90 tablet    Refill:  3  . citalopram (CELEXA) 20 MG tablet    Sig: TAKE 1 TABLET(20 MG) BY MOUTH DAILY    Dispense:  90 tablet    Refill:  3  . buPROPion (WELLBUTRIN XL) 150 MG 24 hr  tablet    Sig: 1 tab po qd x 2 wks, then 2 tabs po qd    Dispense:  180 tablet    Refill:  0  . propranolol ER (INDERAL LA) 80 MG 24 hr capsule    Sig: Take 1 capsule (80 mg total) by mouth daily.    Dispense:  90 capsule    Refill:  0     Norberto Sorenson, M.D.  [facility] [address] [place], [place] [address] [phone] phone [phone] fax  06/04/2016 3:18 PM   Results for orders placed or performed in visit on 05/27/16  CBC  Result Value Ref Range   WBC 5.6 3.8 - 10.8 K/uL   RBC 4.77 3.80 - 5.10 MIL/uL   Hemoglobin 14.2 11.7 - 15.5 g/dL   HCT 64.4 03.4 - 74.2 %   MCV 87.6 80.0 - 100.0 fL   MCH 29.8 27.0 - 33.0 pg   MCHC 34.0 32.0 - 36.0 g/dL   RDW 59.5 63.8 - 75.6 %   Platelets 364 140 - 400 K/uL   MPV 9.7 7.5 - 12.5 fL  Comprehensive metabolic panel  Result Value Ref Range   Sodium 135 135 - 146 mmol/L   Potassium 4.1 3.5 - 5.3 mmol/L   Chloride 97 (L) 98 - 110 mmol/L   CO2 25 20 - 31 mmol/L   Glucose, Bld 85 65 - 99 mg/dL   BUN 16 7 - 25 mg/dL   Creat 4.33 2.95 - 1.88 mg/dL   Total Bilirubin 0.4 0.2 - 1.2 mg/dL   Alkaline Phosphatase 63 33 - 115 U/L   AST 22 10 - 30 U/L   ALT 29 6 - 29 U/L   Total Protein 7.0 6.1 - 8.1 g/dL   Albumin 4.0 3.6 - 5.1 g/dL   Calcium 9.0 8.6 - 41.6 mg/dL  TSH  Result Value Ref Range   TSH 1.27 mIU/L  Lipid panel  Result Value Ref Range   Cholesterol 251 (H) 125 - 200 mg/dL   Triglycerides 606 (H) <150  mg/dL   HDL 46 >=30 mg/dL   Total CHOL/HDL Ratio 5.5 (H) <=5.0 Ratio   VLDL 52 (H) <30 mg/dL   LDL Cholesterol 160 (H) <130 mg/dL  POCT urinalysis dipstick  Result Value Ref Range   Color, UA yellow yellow   Clarity, UA clear clear   Glucose, UA negative negative   Bilirubin, UA negative negative   Ketones, POC UA negative negative   Spec Grav, UA 1.010    Blood, UA negative negative   pH, UA 6.5    Protein Ur, POC negative negative   Urobilinogen, UA 0.2    Nitrite, UA Negative Negative   Leukocytes, UA Negative Negative

## 2016-05-27 NOTE — Patient Instructions (Signed)
     IF you received an x-ray today, you will receive an invoice from [facility]. Please contact [facility] at [phone] with questions or concerns regarding your invoice.   IF you received labwork today, you will receive an invoice from [facility]. Please contact [facility] at [phone] with questions or concerns regarding your invoice.   Our billing staff will not be able to assist you with questions regarding bills from these companies.  You will be contacted with the lab results as soon as they are available. The fastest way to get your results is to activate your My Chart account. Instructions are located on the last page of this paperwork. If you have not heard from us regarding the results in 2 weeks, please contact this office.      

## 2016-05-28 LAB — COMPREHENSIVE METABOLIC PANEL
ALT: 29 U/L (ref 6–29)
AST: 22 U/L (ref 10–30)
Albumin: 4 g/dL (ref 3.6–5.1)
Alkaline Phosphatase: 63 U/L (ref 33–115)
BUN: 16 mg/dL (ref 7–25)
CHLORIDE: 97 mmol/L — AB (ref 98–110)
CO2: 25 mmol/L (ref 20–31)
CREATININE: 0.63 mg/dL (ref 0.50–1.10)
Calcium: 9 mg/dL (ref 8.6–10.2)
Glucose, Bld: 85 mg/dL (ref 65–99)
Potassium: 4.1 mmol/L (ref 3.5–5.3)
SODIUM: 135 mmol/L (ref 135–146)
TOTAL PROTEIN: 7 g/dL (ref 6.1–8.1)
Total Bilirubin: 0.4 mg/dL (ref 0.2–1.2)

## 2016-05-28 LAB — LIPID PANEL
CHOL/HDL RATIO: 5.5 ratio — AB (ref <=5.0)
Cholesterol: 251 mg/dL — ABNORMAL HIGH (ref 125–200)
HDL: 46 mg/dL (ref >=46)
LDL Cholesterol: 153 mg/dL — ABNORMAL HIGH (ref <130)
Triglycerides: 260 mg/dL — ABNORMAL HIGH (ref <150)
VLDL: 52 mg/dL — AB (ref <30)

## 2016-06-04 ENCOUNTER — Encounter: Payer: Self-pay | Admitting: Family Medicine

## 2016-08-05 ENCOUNTER — Ambulatory Visit: Payer: 59 | Admitting: Family Medicine

## 2016-08-19 ENCOUNTER — Ambulatory Visit (INDEPENDENT_AMBULATORY_CARE_PROVIDER_SITE_OTHER): Payer: 59 | Admitting: Family Medicine

## 2016-08-19 ENCOUNTER — Other Ambulatory Visit: Payer: Self-pay | Admitting: Family Medicine

## 2016-08-19 ENCOUNTER — Encounter: Payer: Self-pay | Admitting: Family Medicine

## 2016-08-19 VITALS — BP 102/88 | HR 80 | Temp 99.2°F | Resp 16 | Wt 275.0 lb

## 2016-08-19 DIAGNOSIS — R41840 Attention and concentration deficit: Secondary | ICD-10-CM

## 2016-08-19 DIAGNOSIS — F411 Generalized anxiety disorder: Secondary | ICD-10-CM

## 2016-08-19 DIAGNOSIS — F329 Major depressive disorder, single episode, unspecified: Secondary | ICD-10-CM | POA: Diagnosis not present

## 2016-08-19 DIAGNOSIS — Z23 Encounter for immunization: Secondary | ICD-10-CM

## 2016-08-19 DIAGNOSIS — F32A Depression, unspecified: Secondary | ICD-10-CM

## 2016-08-19 MED ORDER — AMPHETAMINE-DEXTROAMPHETAMINE 10 MG PO TABS: 10.0000 mg | ORAL_TABLET | Freq: Two times a day (BID) | ORAL | 0 refills | Status: DC

## 2016-08-19 NOTE — Patient Instructions (Signed)
     IF you received an x-ray today, you will receive an invoice from [facility]. Please contact [facility] at [phone] with questions or concerns regarding your invoice.   IF you received labwork today, you will receive an invoice from [facility]. Please contact [facility] at [phone] with questions or concerns regarding your invoice.   Our billing staff will not be able to assist you with questions regarding bills from these companies.  You will be contacted with the lab results as soon as they are available. The fastest way to get your results is to activate your My Chart account. Instructions are located on the last page of this paperwork. If you have not heard from us regarding the results in 2 weeks, please contact this office.      

## 2016-08-19 NOTE — Progress Notes (Signed)
Subjective:    Patient ID: Rose Cortez, female    DOB: 09/20/1986, 30 y.o.   MRN: [id number] Chief Complaint  Patient presents with  . Follow-up    medication    HPI  At pt's last visit 3 mos prior we started trial of wellbutrin and propranolol to help with weight loss, ADD sx, anxiety and continued citalopram 20mg .  Pt is here to follow-up on these med changes today,. Know she hasn't lost weight - in fact she has gained >10 lbs and feels like the wellbutrin is not doing anything at all for her weight, energy, mood, focus - she feels absolutely no difference taking it.  Anxiety is about the same, she is waking up at 4:30 to void and can't get back to sleep even though she doesn't have to get up until 6 am.  No change in urinary freq or urgency during day. No change in fluid or caffeine intake, esp in the evenings.  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 However, despite her love of her job, she feels her anxiety worsening and is having trouble disconnecting from work and turning off her stressors and thoughts about work when she is relaxing in the evenings. She has started walking some for exercise, she is on her feet all day at work.  She has noticed that she sweats a lot - esp from her head.   She feels hungry all the time and thinks about food all the time- she never feels full and after eating does not feel satisfied for very long. Then not exercising enough to burn off the food. Propranolol has helped her SVT some but no improvement in anxiety.  Was unable to cont with personal trainer - 30 min a week for $60 and no results after several months.  Her insurance didn't cover the [facility] weight loss program so would cost her $2000 out of pocket.  Years ago, she was put on phentermine for weight loss which worked great for her - Lost about 50 lbs over 5 months but then her weight loss plateaued and so she was taken  off of it and then she resumed her unhealthy eating choices.  No worsening of anxiety sxs on phentermine. During this time she rotated seeing a psychiatrist and psychologist - had to alternate between the two due to cost and didn't really find either that helpful.  Was diagnosed with OCD and put on really expensive medication that didn't help at all. Has also seen counselors in high school and in college but they always told her what she knows - that she just has to find her internal self-motivation but no one actually gave her practical advice nor set firm goals for her. She has joined [employer] prior which was ok but enjoyed zumba the most - got down to her goal weight of 170 lbs when she was doing this.  Depression screen [facility] 2/9 08/19/2016 05/27/2016 01/29/2016 07/14/2015  Decreased Interest 0 0 0 0  Down, Depressed, Hopeless 0 0 0 0  PHQ - 2 Score 0 0 0 0   Past Medical History:  Diagnosis Date  . Anxiety   . Depression   . Hyperlipidemia    No past surgical history on file. Current Outpatient Prescriptions on File Prior to Visit  Medication Sig Dispense Refill  . cholecalciferol (VITAMIN D) 1000 UNITS tablet Take 5,000 Units by mouth daily.    Marland Kitchen  citalopram (CELEXA) 20 MG tablet TAKE 1 TABLET(20 MG) BY MOUTH DAILY 90 tablet 3  . Fish Oil OIL by Does not apply route.    . nystatin (MYCOSTATIN/NYSTOP) 100000 UNIT/GM POWD Apply 5 g topically 3 (three) times daily. 60 g 2  . simvastatin (ZOCOR) 40 MG tablet Take 1 tablet (40 mg total) by mouth daily. 90 tablet 3  . Cetirizine HCl (ZYRTEC ALLERGY) 10 MG CAPS Take by mouth.    . Melatonin 5 MG TABS Take by mouth. Reported on 05/27/2016     No current facility-administered medications on file prior to visit.    No Known Allergies Family History  Problem Relation Age of Onset  . Hyperlipidemia Mother   . Hyperlipidemia Father   . Depression Sister    Social History   Social History  . Marital status: Single    Spouse name: N/A  .  Number of children: N/A  . Years of education: N/A   Social History Main Topics  . Smoking status: Never Smoker  . Smokeless tobacco: None  . Alcohol use Yes     Comment: weekly  . Drug use: No  . Sexual activity: Yes   Other Topics Concern  . None   Social History Narrative  . None     Review of Systems  Constitutional: Positive for activity change, diaphoresis, fatigue and unexpected weight change. Negative for appetite change, chills and fever.  Respiratory: Negative for chest tightness and shortness of breath.   Cardiovascular: Negative for chest pain, palpitations and leg swelling.  Gastrointestinal: Negative for abdominal distention, abdominal pain, constipation, diarrhea, nausea and vomiting.  Musculoskeletal: Positive for arthralgias. Negative for gait problem and joint swelling.  Allergic/Immunologic: Negative for immunocompromised state.  Neurological: Negative for dizziness, tremors, seizures, syncope, speech difficulty, weakness, light-headedness and headaches.  Psychiatric/Behavioral: Positive for decreased concentration and dysphoric mood. Negative for agitation, behavioral problems, confusion, self-injury, sleep disturbance and suicidal ideas. The patient is nervous/anxious. The patient is not hyperactive.        Objective:   Physical Exam  Constitutional: She is oriented to person, place, and time. She appears well-developed and well-nourished. No distress.  HENT:  Head: Normocephalic and atraumatic.  Right Ear: External ear normal.  Eyes: Conjunctivae are normal. No scleral icterus.  Pulmonary/Chest: Effort normal.  Neurological: She is alert and oriented to person, place, and time.  Skin: Skin is warm and dry. She is not diaphoretic. No erythema.  Psychiatric: Her behavior is normal. Judgment and thought content normal. Cognition and memory are normal. She exhibits a depressed mood.    BP 102/88 (BP Location: Right Arm, Patient Position: Sitting, Cuff  Size: Large)   Pulse 80   Temp 99.2 F (37.3 C)   Resp 16   Wt 275 lb (124.7 kg)   LMP 08/15/2016   SpO2 98%   BMI 41.81 kg/m      Assessment & Plan:   1. Need for influenza vaccination   2. Morbid obesity due to excess calories (HCC)   3. GAD (generalized anxiety disorder)   4. Depression   5. Attention and concentration deficit    Pt has not noticed any improvement with the wellbutrin so ok to stop. Still on citalopram 20mg .  Propranolol ER 80mg  is working well to prevent palpitations but has not noticed any beneficial effects on her anxiety yet but will continue. Will do trial of stimulant therapy - pt has been concerned about her attention/concentration sxs for a long time with a +  FHx of ADD dx.  She has also had great weight loss in the past with phenteramine though she notes she did slowly gain weight back after stopping due to unhealthy lifestyle. Pt open to seeing nutrition, trainor, or therapist but does not have great insurance coverage so has been unable to afford to do regularly. It seems like pt may need to work on her stress coping mechanisms so if she continues to struggle may even want to consider food addicts anonymous since it would be no cost. Will try adderall - start at 5mg  qam and can increase to bid as long as she is exercising after work. Encouraged pt to try to find other form of exercise than self-driving walks or gym as she is likely not pushing herself hard enough to get the results that she wants - look on [employer] for zumba or boot camp memberships. Ok to call for an additional 1 mo of adderall refill if doing well.  Needs to recheck in 3 mos to ensure she has lost 15 lbs (5 lbs/mo goal) and to ensure the stimulant med is not causing any adverse cardiovascular effects. Pt would like to loose 100lbs - has goal weight of 170 lbs which I think is appropriate.  Orders Placed This Encounter  Procedures  . Flu Vaccine QUAD 36+ mos IM    Meds ordered this  encounter  Medications  . amphetamine-dextroamphetamine (ADDERALL) 10 MG tablet    Sig: Take 1 tablet (10 mg total) by mouth 2 (two) times daily.    Dispense:  60 tablet    Refill:  0   Over 40 min spent in face-to-face evaluation of and consultation with patient and coordination of care.  Over 50% of this time was spent counseling this patient.  Norberto Sorenson, M.D.  [facility] [address] [place], [place] [address] [phone] phone [phone] fax  08/20/16 11:24 AM

## 2016-08-20 DIAGNOSIS — F329 Major depressive disorder, single episode, unspecified: Secondary | ICD-10-CM | POA: Insufficient documentation

## 2016-08-20 DIAGNOSIS — F32A Depression, unspecified: Secondary | ICD-10-CM | POA: Insufficient documentation

## 2016-08-20 MED ORDER — PROPRANOLOL HCL ER 80 MG PO CP24: 80.0000 mg | ORAL_CAPSULE | Freq: Every day | ORAL | 1 refills | Status: DC

## 2016-08-25 ENCOUNTER — Telehealth: Payer: Self-pay

## 2016-08-25 NOTE — Telephone Encounter (Signed)
PA approved for adderall on cover my meds

## 2016-08-26 ENCOUNTER — Telehealth: Payer: Self-pay

## 2016-08-26 NOTE — Telephone Encounter (Signed)
Approval through 08/25/17.

## 2016-08-26 NOTE — Telephone Encounter (Signed)
Patient is calling to see if she can get a referral to a cardiologist. Patient states she was just seen and forgot to mention it to Dr. Clelia CroftShaw.  [phone]

## 2016-10-26 ENCOUNTER — Telehealth: Payer: Self-pay

## 2016-10-26 ENCOUNTER — Ambulatory Visit (INDEPENDENT_AMBULATORY_CARE_PROVIDER_SITE_OTHER): Payer: 59 | Admitting: Family Medicine

## 2016-10-26 VITALS — BP 130/80 | HR 115 | Temp 99.1°F | Resp 17 | Ht 69.5 in | Wt 281.0 lb

## 2016-10-26 DIAGNOSIS — I471 Supraventricular tachycardia: Secondary | ICD-10-CM

## 2016-10-26 DIAGNOSIS — J029 Acute pharyngitis, unspecified: Secondary | ICD-10-CM | POA: Diagnosis not present

## 2016-10-26 DIAGNOSIS — R002 Palpitations: Secondary | ICD-10-CM

## 2016-10-26 LAB — POCT RAPID STREP A (OFFICE): RAPID STREP A SCREEN: NEGATIVE

## 2016-10-26 NOTE — Telephone Encounter (Signed)
Patient is following up on a message she left 08/26/16 requesting a referral for cardiology. Please advise!  "Patient is calling to see if she can get a referral to a cardiologist. Patient states she was just seen and forgot to mention it to Dr. Clelia CroftShaw."  [phone]

## 2016-10-26 NOTE — Telephone Encounter (Signed)
Routed to Dr. Clelia CroftShaw. I did not see where pts heart issues were discussed in most recent visit so I called pt. Pt stated that it has been discussed in previous visits and you have prescribed her heart medication in the past. Please advise. Thanks.

## 2016-10-26 NOTE — Progress Notes (Signed)
By signing my name below, I, Mesha Guinyard, attest that this documentation has been prepared under the direction and in the presence of Meredith Staggers, MD.  Electronically Signed: Arvilla Market, Medical Scribe. 10/26/16. 8:51 AM.  Subjective:    Patient ID: Rose Cortez, female    DOB: 10-31-1986, 30 y.o.   MRN: [id number]  HPI Chief Complaint  Patient presents with  . Sore Throat  . URI    HPI Comments: Rose Cortez is a 30 y.o. female who presents to the [facility] complaining of sore throat since last night. She initially felt a scratch in her throat 3 days ago and her symptoms have now progressed to wet cough with yellow sputum, and congestion. Pt took Advil cold and sinus without relief to her symptoms. 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 Pt states she's had strep without the typical symptoms in the past. Denies fevers, chills, and loss of appetite.  Patient Active Problem List   Diagnosis Date Noted  . Depression 08/20/2016  . GAD (generalized anxiety disorder) 04/14/2015  . Obesity 04/14/2015  . Dyslipidemia 04/14/2015   Past Medical History:  Diagnosis Date  . Anxiety   . Depression   . Hyperlipidemia    No past surgical history on file. No Known Allergies Prior to Admission medications   Medication Sig Start Date End Date Taking? Authorizing Provider  amphetamine-dextroamphetamine (ADDERALL) 10 MG tablet Take 1 tablet (10 mg total) by mouth 2 (two) times daily. 08/19/16  Yes Sherren Mocha, MD  Cetirizine HCl (ZYRTEC ALLERGY) 10 MG CAPS Take by mouth.   Yes Historical Provider, MD  cholecalciferol (VITAMIN D) 1000 UNITS tablet Take 5,000 Units by mouth daily.   Yes Historical Provider, MD  citalopram (CELEXA) 20 MG tablet TAKE 1 TABLET(20 MG) BY MOUTH DAILY 05/27/16  Yes Sherren Mocha, MD  Fish Oil OIL by Does not apply route.   Yes Historical Provider, MD  Melatonin  5 MG TABS Take by mouth. Reported on 05/27/2016   Yes Historical Provider, MD  nystatin (MYCOSTATIN/NYSTOP) 100000 UNIT/GM POWD Apply 5 g topically 3 (three) times daily. 01/29/16  Yes Sherren Mocha, MD  propranolol ER (INDERAL LA) 80 MG 24 hr capsule Take 1 capsule (80 mg total) by mouth daily. 08/20/16  Yes Sherren Mocha, MD  simvastatin (ZOCOR) 40 MG tablet Take 1 tablet (40 mg total) by mouth daily. 05/27/16  Yes Sherren Mocha, MD   Social History   Social History  . Marital status: Single    Spouse name: N/A  . Number of children: N/A  . Years of education: N/A   Occupational History  . Not on file.   Social History Main Topics  . Smoking status: Never Smoker  . Smokeless tobacco: Not on file  . Alcohol use Yes     Comment: weekly  . Drug use: No  . Sexual activity: Yes   Other Topics Concern  . Not on file   Social History Narrative  . No narrative on file   Review of Systems  Constitutional: Negative for appetite change, chills and fever.  HENT: Positive for congestion and sore throat.   Respiratory: Positive for cough.    Objective:  Physical Exam  Constitutional: She is oriented to person, place, and time. She appears well-developed and well-nourished. No distress.  HENT:  Head: Normocephalic and atraumatic.  Right Ear:  Hearing, tympanic membrane, external ear and ear canal normal.  Left Ear: Hearing, tympanic membrane, external ear and ear canal normal.  Nose: Nose normal.  Mouth/Throat: Oropharynx is clear and moist. No oropharyngeal exudate.  Eyes: Conjunctivae and EOM are normal. Pupils are equal, round, and reactive to light.  Cardiovascular: Normal rate, regular rhythm, normal heart sounds and intact distal pulses.  Exam reveals no gallop and no friction rub.   No murmur heard. Pulmonary/Chest: Effort normal and breath sounds normal. No respiratory distress. She has no wheezes. She has no rhonchi.  Lymphadenopathy:    She has no cervical adenopathy.  Neurological:  She is alert and oriented to person, place, and time.  Skin: Skin is warm and dry. No rash noted.  Psychiatric: She has a normal mood and affect. Her behavior is normal.  Vitals reviewed.  BP 130/80 (BP Location: Right Arm, Patient Position: Sitting, Cuff Size: Normal)   Pulse (!) 115   Temp 99.1 F (37.3 C) (Oral)   Resp 17   Ht 5' 9.5" (1.765 m)   Wt 281 lb (127.5 kg)   LMP 08/19/2016   SpO2 96%   BMI 40.90 kg/m    Results for orders placed or performed in visit on 10/26/16  POCT rapid strep A  Result Value Ref Range   Rapid Strep A Screen Negative Negative   Assessment & Plan:    Rose Cortez is a 30 y.o. female Sore throat - Plan: POCT rapid strep A, Culture, Group A Strep  - Suspected viral illness. Check throat culture, symptomatic care discussed, RTC precautions given.   No orders of the defined types were placed in this encounter.  Patient Instructions   Currently her symptoms appear to be due to a virus, but I will check a strep test as well as a throat culture for strep if initial test is normal. We will call you if those tests are positive.  Sore throat lozenges as discussed, fluids, other symptomatic care as discussed below. Return to the clinic or go to the nearest emergency room if any of your symptoms worsen or new symptoms occur.   Upper Respiratory Infection, Adult Most upper respiratory infections (URIs) are a viral infection of the air passages leading to the lungs. A URI affects the nose, throat, and upper air passages. The most common type of URI is nasopharyngitis and is typically referred to as "the common cold." URIs run their course and usually go away on their own. Most of the time, a URI does not require medical attention, but sometimes a bacterial infection in the upper airways can follow a viral infection. This is called a secondary infection. Sinus and middle ear infections are common types of secondary upper respiratory infections. Bacterial  pneumonia can also complicate a URI. A URI can worsen asthma and chronic obstructive pulmonary disease (COPD). Sometimes, these complications can require emergency medical care and may be life threatening. What are the causes? Almost all URIs are caused by viruses. A virus is a type of germ and can spread from one person to another. What increases the risk? You may be at risk for a URI if:  You smoke.  You have chronic heart or lung disease.  You have a weakened defense (immune) system.  You are very young or very old.  You have nasal allergies or asthma.  You work in crowded or poorly ventilated areas.  You work in health care facilities or schools. What are the signs or symptoms? Symptoms typically  develop 2-3 days after you come in contact with a cold virus. Most viral URIs last 7-10 days. However, viral URIs from the influenza virus (flu virus) can last 14-18 days and are typically more severe. Symptoms may include:  Runny or stuffy (congested) nose.  Sneezing.  Cough.  Sore throat.  Headache.  Fatigue.  Fever.  Loss of appetite.  Pain in your forehead, behind your eyes, and over your cheekbones (sinus pain).  Muscle aches. How is this diagnosed? Your health care provider may diagnose a URI by:  Physical exam.  Tests to check that your symptoms are not due to another condition such as:  Strep throat.  Sinusitis.  Pneumonia.  Asthma. How is this treated? A URI goes away on its own with time. It cannot be cured with medicines, but medicines may be prescribed or recommended to relieve symptoms. Medicines may help:  Reduce your fever.  Reduce your cough.  Relieve nasal congestion. Follow these instructions at home:  Take medicines only as directed by your health care provider.  Gargle warm saltwater or take cough drops to comfort your throat as directed by your health care provider.  Use a warm mist humidifier or inhale steam from a shower to  increase air moisture. This may make it easier to breathe.  Drink enough fluid to keep your urine clear or pale yellow.  Eat soups and other clear broths and maintain good nutrition.  Rest as needed.  Return to work when your temperature has returned to normal or as your health care provider advises. You may need to stay home longer to avoid infecting others. You can also use a face mask and careful hand washing to prevent spread of the virus.  Increase the usage of your inhaler if you have asthma.  Do not use any tobacco products, including cigarettes, chewing tobacco, or electronic cigarettes. If you need help quitting, ask your health care provider. How is this prevented? The best way to protect yourself from getting a cold is to practice good hygiene.  Avoid oral or hand contact with people with cold symptoms.  Wash your hands often if contact occurs. There is no clear evidence that vitamin C, vitamin E, echinacea, or exercise reduces the chance of developing a cold. However, it is always recommended to get plenty of rest, exercise, and practice good nutrition. Contact a health care provider if:  You are getting worse rather than better.  Your symptoms are not controlled by medicine.  You have chills.  You have worsening shortness of breath.  You have brown or red mucus.  You have yellow or brown nasal discharge.  You have pain in your face, especially when you bend forward.  You have a fever.  You have swollen neck glands.  You have pain while swallowing.  You have white areas in the back of your throat. Get help right away if:  You have severe or persistent:  Headache.  Ear pain.  Sinus pain.  Chest pain.  You have chronic lung disease and any of the following:  Wheezing.  Prolonged cough.  Coughing up blood.  A change in your usual mucus.  You have a stiff neck.  You have changes in your:  Vision.  Hearing.  Thinking.  Mood. This  information is not intended to replace advice given to you by your health care provider. Make sure you discuss any questions you have with your health care provider. Document Released: 05/25/2001 Document Revised: 08/01/2016 Document Reviewed: 03/06/2014 Elsevier  Interactive Patient Education  2017 Elsevier Inc.  Sore Throat A sore throat is pain, burning, irritation, or scratchiness in the throat. When you have a sore throat, you may feel pain or tenderness in your throat when you swallow or talk. Many things can cause a sore throat, including:  An infection.  Seasonal allergies.  Dryness in the air.  Irritants, such as smoke or pollution.  Gastroesophageal reflux disease (GERD).  A tumor. A sore throat is often the first sign of another sickness. It may happen with other symptoms, such as coughing, sneezing, fever, and swollen neck glands. Most sore throats go away without medical treatment. Follow these instructions at home:  Take over-the-counter medicines only as told by your health care provider.  Drink enough fluids to keep your urine clear or pale yellow.  Rest as needed.  To help with pain, try:  Sipping warm liquids, such as broth, herbal tea, or warm water.  Eating or drinking cold or frozen liquids, such as frozen ice pops.  Gargling with a salt-water mixture 3-4 times a day or as needed. To make a salt-water mixture, completely dissolve -1 tsp of salt in 1 cup of warm water.  Sucking on hard candy or throat lozenges.  Putting a cool-mist humidifier in your bedroom at night to moisten the air.  Sitting in the bathroom with the door closed for 5-10 minutes while you run hot water in the shower.  Do not use any tobacco products, such as cigarettes, chewing tobacco, and e-cigarettes. If you need help quitting, ask your health care provider. Contact a health care provider if:  You have a fever for more than 2-3 days.  You have symptoms that last (are  persistent) for more than 2-3 days.  Your throat does not get better within 7 days.  You have a fever and your symptoms suddenly get worse. Get help right away if:  You have difficulty breathing.  You cannot swallow fluids, soft foods, or your saliva.  You have increased swelling in your throat or neck.  You have persistent nausea and vomiting. This information is not intended to replace advice given to you by your health care provider. Make sure you discuss any questions you have with your health care provider. Document Released: 01/06/2005 Document Revised: 07/25/2016 Document Reviewed: 09/19/2015 Elsevier Interactive Patient Education  2017 [employer]   IF you received an x-ray today, you will receive an invoice from [facility]. Please contact [facility] at [phone] with questions or concerns regarding your invoice.   IF you received labwork today, you will receive an invoice from [facility]. Please contact [facility] at [phone] with questions or concerns regarding your invoice.   Our billing staff will not be able to assist you with questions regarding bills from these companies.  You will be contacted with the lab results as soon as they are available. The fastest way to get your results is to activate your My Chart account. Instructions are located on the last page of this paperwork. If you have not heard from us regarding the results in 2 weeks, please contact this office.        I personally performed the services described in this documentation, which was scribed in my presence. The recorded information has been reviewed and considered, and addended by me as needed.   Signed,   Meredith StaggersJeffrey Rogue Pautler, MD [facility].  10/26/16 10:11 AM

## 2016-10-26 NOTE — Patient Instructions (Addendum)
Currently her symptoms appear to be due to a virus, but I will check a strep test as well as a throat culture for strep if initial test is normal. We will call you if those tests are positive.  Sore throat lozenges as discussed, fluids, other symptomatic care as discussed below. Return to the clinic or go to the nearest emergency room if any of your symptoms worsen or new symptoms occur.   Upper Respiratory Infection, Adult Most upper respiratory infections (URIs) are a viral infection of the air passages leading to the lungs. A URI affects the nose, throat, and upper air passages. The most common type of URI is nasopharyngitis and is typically referred to as "the common cold." URIs run their course and usually go away on their own. Most of the time, a URI does not require medical attention, but sometimes a bacterial infection in the upper airways can follow a viral infection. This is called a secondary infection. Sinus and middle ear infections are common types of secondary upper respiratory infections. Bacterial pneumonia can also complicate a URI. A URI can worsen asthma and chronic obstructive pulmonary disease (COPD). Sometimes, these complications can require emergency medical care and may be life threatening. What are the causes? Almost all URIs are caused by viruses. A virus is a type of germ and can spread from one person to another. What increases the risk? You may be at risk for a URI if:  You smoke.  You have chronic heart or lung disease.  You have a weakened defense (immune) system.  You are very young or very old.  You have nasal allergies or asthma.  You work in crowded or poorly ventilated areas.  You work in health care facilities or schools. What are the signs or symptoms? Symptoms typically develop 2-3 days after you come in contact with a cold virus. Most viral URIs last 7-10 days. However, viral URIs from the influenza virus (flu virus) can last 14-18 days and are  typically more severe. Symptoms may include:  Runny or stuffy (congested) nose.  Sneezing.  Cough.  Sore throat.  Headache.  Fatigue.  Fever.  Loss of appetite.  Pain in your forehead, behind your eyes, and over your cheekbones (sinus pain).  Muscle aches. How is this diagnosed? Your health care provider may diagnose a URI by:  Physical exam.  Tests to check that your symptoms are not due to another condition such as:  Strep throat.  Sinusitis.  Pneumonia.  Asthma. How is this treated? A URI goes away on its own with time. It cannot be cured with medicines, but medicines may be prescribed or recommended to relieve symptoms. Medicines may help:  Reduce your fever.  Reduce your cough.  Relieve nasal congestion. Follow these instructions at home:  Take medicines only as directed by your health care provider.  Gargle warm saltwater or take cough drops to comfort your throat as directed by your health care provider.  Use a warm mist humidifier or inhale steam from a shower to increase air moisture. This may make it easier to breathe.  Drink enough fluid to keep your urine clear or pale yellow.  Eat soups and other clear broths and maintain good nutrition.  Rest as needed.  Return to work when your temperature has returned to normal or as your health care provider advises. You may need to stay home longer to avoid infecting others. You can also use a face mask and careful hand washing to prevent spread  of the virus.  Increase the usage of your inhaler if you have asthma.  Do not use any tobacco products, including cigarettes, chewing tobacco, or electronic cigarettes. If you need help quitting, ask your health care provider. How is this prevented? The best way to protect yourself from getting a cold is to practice good hygiene.  Avoid oral or hand contact with people with cold symptoms.  Wash your hands often if contact occurs. There is no clear evidence  that vitamin C, vitamin E, echinacea, or exercise reduces the chance of developing a cold. However, it is always recommended to get plenty of rest, exercise, and practice good nutrition. Contact a health care provider if:  You are getting worse rather than better.  Your symptoms are not controlled by medicine.  You have chills.  You have worsening shortness of breath.  You have brown or red mucus.  You have yellow or brown nasal discharge.  You have pain in your face, especially when you bend forward.  You have a fever.  You have swollen neck glands.  You have pain while swallowing.  You have white areas in the back of your throat. Get help right away if:  You have severe or persistent:  Headache.  Ear pain.  Sinus pain.  Chest pain.  You have chronic lung disease and any of the following:  Wheezing.  Prolonged cough.  Coughing up blood.  A change in your usual mucus.  You have a stiff neck.  You have changes in your:  Vision.  Hearing.  Thinking.  Mood. This information is not intended to replace advice given to you by your health care provider. Make sure you discuss any questions you have with your health care provider. Document Released: 05/25/2001 Document Revised: 08/01/2016 Document Reviewed: 03/06/2014 Elsevier Interactive Patient Education  2017 Elsevier Inc.  Sore Throat A sore throat is pain, burning, irritation, or scratchiness in the throat. When you have a sore throat, you may feel pain or tenderness in your throat when you swallow or talk. Many things can cause a sore throat, including:  An infection.  Seasonal allergies.  Dryness in the air.  Irritants, such as smoke or pollution.  Gastroesophageal reflux disease (GERD).  A tumor. A sore throat is often the first sign of another sickness. It may happen with other symptoms, such as coughing, sneezing, fever, and swollen neck glands. Most sore throats go away without medical  treatment. Follow these instructions at home:  Take over-the-counter medicines only as told by your health care provider.  Drink enough fluids to keep your urine clear or pale yellow.  Rest as needed.  To help with pain, try:  Sipping warm liquids, such as broth, herbal tea, or warm water.  Eating or drinking cold or frozen liquids, such as frozen ice pops.  Gargling with a salt-water mixture 3-4 times a day or as needed. To make a salt-water mixture, completely dissolve -1 tsp of salt in 1 cup of warm water.  Sucking on hard candy or throat lozenges.  Putting a cool-mist humidifier in your bedroom at night to moisten the air.  Sitting in the bathroom with the door closed for 5-10 minutes while you run hot water in the shower.  Do not use any tobacco products, such as cigarettes, chewing tobacco, and e-cigarettes. If you need help quitting, ask your health care provider. Contact a health care provider if:  You have a fever for more than 2-3 days.  You have symptoms that  last (are persistent) for more than 2-3 days.  Your throat does not get better within 7 days.  You have a fever and your symptoms suddenly get worse. Get help right away if:  You have difficulty breathing.  You cannot swallow fluids, soft foods, or your saliva.  You have increased swelling in your throat or neck.  You have persistent nausea and vomiting. This information is not intended to replace advice given to you by your health care provider. Make sure you discuss any questions you have with your health care provider. Document Released: 01/06/2005 Document Revised: 07/25/2016 Document Reviewed: 09/19/2015 Elsevier Interactive Patient Education  2017 [employer]   IF you received an x-ray today, you will receive an invoice from [facility]. Please contact [facility] at [phone] with questions or concerns regarding your invoice.   IF you received labwork today, you will  receive an invoice from [facility]. Please contact [facility] at [phone] with questions or concerns regarding your invoice.   Our billing staff will not be able to assist you with questions regarding bills from these companies.  You will be contacted with the lab results as soon as they are available. The fastest way to get your results is to activate your My Chart account. Instructions are located on the last page of this paperwork. If you have not heard from us regarding the results in 2 weeks, please contact this office.

## 2016-10-27 LAB — CULTURE, GROUP A STREP: Organism ID, Bacteria: NORMAL

## 2016-10-27 NOTE — Telephone Encounter (Signed)
placed

## 2016-11-03 ENCOUNTER — Encounter: Payer: Self-pay | Admitting: Cardiology

## 2016-11-15 ENCOUNTER — Ambulatory Visit: Payer: Self-pay | Admitting: Cardiology

## 2016-11-18 ENCOUNTER — Ambulatory Visit: Payer: 59 | Admitting: Family Medicine

## 2016-12-02 ENCOUNTER — Other Ambulatory Visit: Payer: Self-pay

## 2016-12-02 MED ORDER — PROPRANOLOL HCL ER 80 MG PO CP24: 80.0000 mg | ORAL_CAPSULE | Freq: Every day | ORAL | 0 refills | Status: DC

## 2016-12-02 MED ORDER — SIMVASTATIN 40 MG PO TABS: 40.0000 mg | ORAL_TABLET | Freq: Every day | ORAL | 0 refills | Status: DC

## 2016-12-02 MED ORDER — CITALOPRAM HYDROBROMIDE 20 MG PO TABS: ORAL_TABLET | ORAL | 0 refills | Status: DC

## 2016-12-02 NOTE — Telephone Encounter (Signed)
05/2016 last ov 

## 2017-01-14 ENCOUNTER — Other Ambulatory Visit: Payer: Self-pay | Admitting: Family Medicine

## 2017-01-24 ENCOUNTER — Ambulatory Visit: Payer: 59 | Admitting: Cardiology

## 2017-01-29 NOTE — Progress Notes (Signed)
Subjective:    Patient ID: Rose Cortez, female    DOB: 06-04-1986, 31 y.o.   MRN: [id number] Chief Complaint  Patient presents with  . Follow-up    Dx with Flu on Saturday   . Headache  . Nausea    from headache pain     HPI  Rose Cortez is a 31 yo here for a chronic intractable headache and STD screening.  Her last was done by myself 05/27/16. I last saw pt 5 mos prior.  She [facility] with [facility] B [facility]. SHe has been alternating between ibuprofen, tylenol, aleve and using liquid tylenol, nyquil as well.  She has a scratchy throat and cough to the point of post-tussive emesis.   Primary Preventative Screenings: Cervical Cancer: Sev pap smears, no h/o abnml, last neg/normal pap 08/16 Family Planning: condums every time. STI screening: Tobacco use: none Cardiac: Weight/blood sugar: OTC/vit/supp/herbal: Diet/Exercise/EtOH/substances: Dentist/Optho: Immunizations: TDaP done 2016, Had flu shot this season  Chronic Medical Conditions: Obesity: Pt applied to the [employer] program but has poor insurance coverage so can't afford. Pt open to seeing nutrition, trainor, or therapist but does not have great insurance coverage so has been unable to afford to do regularly and has done all of these things prior with limited success. Hoping stimulant therapy will help since she responded well to phentermine prior (although of course regained when stopped). Gained 10 lbs on wellbutrin. Set goal of loosing 15 lbs since her last visit. Has reached her goal weight prior on own so pt hopeful it can be done again. Large breasts keep her from exercising to much.  Palpitations: paroxysmal SVT, had for years and prior was well-controlled on atenolol when young. Was referred to cardiology per pt request but has cancelled visit. Now on propranolol ER 80 mg which is fairly effective at supressing sxs (was hoping propranolol would also help anxiety though it did not).  Is Exacerbated by exercise. Episodes  consist of fluttering chest sensation that last for several minutes and is relieved by squatting. Mood d/o/GAD/Depression/? Of Adult ADD: Failed wellbutrin, zoloft, paxil (and propranolol 80). Prior was using xanax 0.5mg  prn 2-3x/wk. Still on citalopram 20mg  at last visit 5 mos prior. Started trial of stimulant therapy at last visit (hoping it will help weightloss as well) with adderall 5mg  qam and increase to bid prn as long as she has been able to incorporate exercise into her regimen as well. It was ineffective so increased to 10mg  bid but no refill in past 3 mos so assume also ineffective. HLD: on zocor 40 for years - restarted last yr as off simvastatin for just a few wks with LDL 115->153; t chol 179 ->409>251, non-hdl 143->205. 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 Anxiety   . Depression   . Hyperlipidemia    History reviewed. No pertinent surgical history. Current Outpatient Prescriptions on File Prior to Visit  Medication Sig Dispense Refill  . cholecalciferol (VITAMIN D) 1000 UNITS tablet Take 5,000 Units by mouth daily.    . citalopram (CELEXA) 20 MG tablet TAKE 1 TABLET BY MOUTH  DAILY 90 tablet 0  . Fish Oil OIL by Does not apply route.    . nystatin (MYCOSTATIN/NYSTOP) 100000 UNIT/GM POWD Apply 5 g topically 3 (three) times daily. 60 g 2  . simvastatin (ZOCOR) 40 MG tablet TAKE 1 TABLET BY MOUTH  DAILY 90 tablet 0  . Melatonin  5 MG TABS Take by mouth. Reported on 05/27/2016    . propranolol ER (INDERAL LA) 80 MG 24 hr capsule Take 1 capsule (80 mg total) by mouth daily. (Patient not taking: Reported on 02/01/2017) 90 capsule 0   No current facility-administered medications on file prior to visit.    No Known Allergies Family History  Problem Relation Age of Onset  . Hyperlipidemia Mother   . Hyperlipidemia Father   . Depression Sister    Social History   Social History  .  Marital status: Single    Spouse name: N/A  . Number of children: N/A  . Years of education: N/A   Social History Main Topics  . Smoking status: Never Smoker  . Smokeless tobacco: Never Used  . Alcohol use Yes     Comment: weekly  . Drug use: No  . Sexual activity: Yes   Other Topics Concern  . None   Social History Narrative  . None   Depression screen [facility] 2/9 02/01/2017 01/31/2017 10/26/2016 08/19/2016 05/27/2016  Decreased Interest 0 0 0 0 0  Down, Depressed, Hopeless 0 0 0 0 0  PHQ - 2 Score 0 0 0 0 0      .Review of Systems  Constitutional: Positive for activity change, appetite change, chills, diaphoresis and fatigue. Negative for fever and unexpected weight change.  HENT: Positive for congestion and rhinorrhea. Negative for ear discharge, ear pain, mouth sores, nosebleeds, postnasal drip, sinus pressure, sneezing, sore throat, trouble swallowing and voice change.   Eyes: Negative for photophobia and pain.  Respiratory: Positive for cough. Negative for chest tightness, shortness of breath and wheezing.   Cardiovascular: Negative for chest pain.  Gastrointestinal: Positive for nausea and vomiting. Negative for abdominal pain, constipation and diarrhea.  Genitourinary: Positive for decreased urine volume and difficulty urinating. Negative for dysuria and frequency.  Musculoskeletal: Positive for back pain and myalgias. Negative for arthralgias, gait problem, joint swelling, neck pain and neck stiffness.  Neurological: Positive for weakness, light-headedness and headaches. Negative for dizziness and syncope.  Hematological: Negative for adenopathy.  Psychiatric/Behavioral: Positive for sleep disturbance.       Objective:   Physical Exam  Constitutional: She is oriented to person, place, and time. She appears well-developed and well-nourished. She appears lethargic. She appears ill. No distress.  HENT:  Head: Normocephalic and atraumatic.  Right Ear: Tympanic membrane,  external ear and ear canal normal.  Left Ear: Tympanic membrane, external ear and ear canal normal.  Nose: Nose normal. No mucosal edema or rhinorrhea.  Mouth/Throat: Uvula is midline, oropharynx is clear and moist and mucous membranes are normal. No oropharyngeal exudate.  Eyes: Conjunctivae are normal. Right eye exhibits no discharge. Left eye exhibits no discharge. No scleral icterus.  Neck: Normal range of motion and full passive range of motion without pain. Neck supple. No erythema and normal range of motion present. No Brudzinski's sign and no Kernig's sign noted. No thyroid mass and no thyromegaly present.  Cardiovascular: Normal rate, regular rhythm, normal heart sounds and intact distal pulses.   Pulmonary/Chest: Effort normal and breath sounds normal.  freq dry hacking cough  Abdominal: Soft. Bowel sounds are normal. She exhibits no distension and no mass. There is no tenderness. There is no rebound and no guarding.  Lymphadenopathy:    She has no cervical adenopathy.  Neurological: She is oriented to person, place, and time. She appears lethargic.  Skin: Skin is warm. She is diaphoretic. No erythema.  Psychiatric: She has a  normal mood and affect. Her behavior is normal.     BP (!) 142/97   Pulse (!) 107   Temp 98.2 F (36.8 C) (Oral)   Resp 18   Ht 5' 9.5" (1.765 m)   Wt 276 lb 9.6 oz (125.5 kg)   LMP 01/15/2017   SpO2 96%   BMI 40.26 kg/m      Assessment & Plan:  Pap due 07/2018.  We do not have a EKG on file so at CPE in 4 mos rec EKG, hgba1c, ua  1. Acute intractable headache, unspecified headache type - failed to respond to toradol 60 mg IM 2d prior, pt had ibuprofen this a.m. So treat w/ benadryl and depomedrol IM now, start scheduled promethazine, push fluids. RTC tomorrow if HA continues  2. Routine screening for STI (sexually transmitted infection)   3. GAD (generalized anxiety disorder)   4. Dyslipidemia - suspect very high due to acute hepatitis, hold  statin  5. Moderate episode of recurrent major depressive disorder (HCC)   6. Class 3 obesity due to excess calories with serious comorbidity and body mass index (BMI) of 40.0 to 44.9 in adult (HCC)   7. Medication monitoring encounter   8. Paroxysmal supraventricular tachycardia (HCC) - pt denies recurrence of sxs off of propranolol which was not covered on her ins formulary. If sxs recur, try metoprolol  9. Influenza with respiratory manifestation     Orders Placed This Encounter  Procedures  . GC/Chlamydia Probe Amp  . CBC  . Comprehensive metabolic panel    Order Specific Question:   Has the patient fasted?    Answer:   Yes  . TSH  . Lipid panel    Order Specific Question:   Has the patient fasted?    Answer:   Yes  . HIV antibody  . RPR  . Hepatitis C Antibody  . POCT Wet + KOH Prep    Meds ordered this encounter  Medications  . diphenhydrAMINE (BENADRYL) injection 50 mg  . DISCONTD: promethazine (PHENERGAN) injection 25 mg  . methylPREDNISolone acetate (DEPO-MEDROL) injection 80 mg  . azithromycin (ZITHROMAX) 250 MG tablet    Sig: Take 2 tabs PO x 1 dose, then 1 tab PO QD x 4 days    Dispense:  6 tablet    Refill:  0  . chlorpheniramine-HYDROcodone (TUSSIONEX PENNKINETIC ER) 10-8 MG/5ML SUER    Sig: Take 5 mLs by mouth every 12 (twelve) hours as needed.    Dispense:  120 mL    Refill:  0  . promethazine (PHENERGAN) 25 MG tablet    Sig: Take 1 tablet (25 mg total) by mouth every 8 (eight) hours as needed for nausea or vomiting.    Dispense:  20 tablet    Refill:  0  . fluconazole (DIFLUCAN) 150 MG tablet    Sig: Take 1 tablet (150 mg total) by mouth once. Repeat if needed    Dispense:  1 tablet    Refill:  1     Norberto Sorenson, M.D.  Primary Care at [facility] [address] [place], [place] [address] [phone] phone [phone] fax  02/01/17 6:03 PM

## 2017-01-31 ENCOUNTER — Ambulatory Visit (INDEPENDENT_AMBULATORY_CARE_PROVIDER_SITE_OTHER): Payer: 59 | Admitting: Family Medicine

## 2017-01-31 ENCOUNTER — Encounter: Payer: Self-pay | Admitting: Family Medicine

## 2017-01-31 VITALS — BP 142/97 | HR 107 | Temp 98.2°F | Resp 18 | Ht 69.5 in | Wt 276.6 lb

## 2017-01-31 DIAGNOSIS — E6609 Other obesity due to excess calories: Secondary | ICD-10-CM

## 2017-01-31 DIAGNOSIS — J111 Influenza due to unidentified influenza virus with other respiratory manifestations: Secondary | ICD-10-CM | POA: Diagnosis not present

## 2017-01-31 DIAGNOSIS — IMO0001 Reserved for inherently not codable concepts without codable children: Secondary | ICD-10-CM

## 2017-01-31 DIAGNOSIS — I471 Supraventricular tachycardia: Secondary | ICD-10-CM | POA: Diagnosis not present

## 2017-01-31 DIAGNOSIS — F411 Generalized anxiety disorder: Secondary | ICD-10-CM | POA: Diagnosis not present

## 2017-01-31 DIAGNOSIS — Z5181 Encounter for therapeutic drug level monitoring: Secondary | ICD-10-CM | POA: Diagnosis not present

## 2017-01-31 DIAGNOSIS — E785 Hyperlipidemia, unspecified: Secondary | ICD-10-CM | POA: Diagnosis not present

## 2017-01-31 DIAGNOSIS — Z6841 Body Mass Index (BMI) 40.0 and over, adult: Secondary | ICD-10-CM | POA: Diagnosis not present

## 2017-01-31 DIAGNOSIS — R519 Headache, unspecified: Secondary | ICD-10-CM

## 2017-01-31 DIAGNOSIS — Z113 Encounter for screening for infections with a predominantly sexual mode of transmission: Secondary | ICD-10-CM

## 2017-01-31 DIAGNOSIS — R51 Headache: Secondary | ICD-10-CM

## 2017-01-31 DIAGNOSIS — F331 Major depressive disorder, recurrent, moderate: Secondary | ICD-10-CM | POA: Diagnosis not present

## 2017-01-31 LAB — POCT WET + KOH PREP
TRICH BY WET PREP: ABSENT
YEAST BY WET PREP: ABSENT
Yeast by KOH: ABSENT

## 2017-01-31 MED ORDER — PROMETHAZINE HCL 25 MG PO TABS: 25.0000 mg | ORAL_TABLET | Freq: Three times a day (TID) | ORAL | 0 refills | Status: DC | PRN

## 2017-01-31 MED ORDER — HYDROCOD POLST-CPM POLST ER 10-8 MG/5ML PO SUER: 5.0000 mL | Freq: Two times a day (BID) | ORAL | 0 refills | Status: DC | PRN

## 2017-01-31 MED ORDER — FLUCONAZOLE 150 MG PO TABS: 150.0000 mg | ORAL_TABLET | Freq: Once | ORAL | 1 refills | Status: AC

## 2017-01-31 MED ORDER — DIPHENHYDRAMINE HCL 50 MG/ML IJ SOLN
50.0000 mg | Freq: Once | INTRAMUSCULAR | Status: AC
  Administered 2017-01-31: 50 mg via INTRAMUSCULAR

## 2017-01-31 MED ORDER — AZITHROMYCIN 250 MG PO TABS: ORAL_TABLET | ORAL | 0 refills | Status: DC

## 2017-01-31 MED ORDER — PROMETHAZINE HCL 25 MG/ML IJ SOLN: 25.0000 mg | Freq: Once | INTRAMUSCULAR | Status: DC

## 2017-01-31 MED ORDER — METHYLPREDNISOLONE ACETATE 80 MG/ML IJ SUSP
80.0000 mg | Freq: Once | INTRAMUSCULAR | Status: AC
  Administered 2017-01-31: 80 mg via INTRAMUSCULAR

## 2017-01-31 NOTE — Patient Instructions (Addendum)
IF you received an x-ray today, you will receive an invoice from [facility]. Please contact [facility] at [phone] with questions or concerns regarding your invoice.   IF you received labwork today, you will receive an invoice from [facility]. Please contact [facility] at [phone] with questions or concerns regarding your invoice.   Our billing staff will not be able to assist you with questions regarding bills from these companies.  You will be contacted with the lab results as soon as they are available. The fastest way to get your results is to activate your My Chart account. Instructions are located on the last page of this paperwork. If you have not heard from us regarding the results in 2 weeks, please contact this office.      Dehydration, Adult Dehydration is a condition in which there is not enough fluid or water in the body. This happens when you lose more fluids than you take in. Important organs, such as the kidneys, brain, and heart, cannot function without a proper amount of fluids. Any loss of fluids from the body can lead to dehydration. Dehydration can range from mild to severe. This condition should be treated right away to prevent it from becoming severe. What are the causes? This condition may be caused by:  Vomiting.  Diarrhea.  Excessive sweating, such as from heat exposure or exercise.  Not drinking enough fluid, especially:  When ill.  While doing activity that requires a lot of energy.  Excessive urination.  Fever.  Infection.  Certain medicines, such as medicines that cause the body to lose excess fluid (diuretics).  Inability to access safe drinking water.  Reduced physical ability to get adequate water and food. What increases the risk? This condition is more likely to develop in people:  Who have a poorly controlled long-term (chronic) illness, such as diabetes, heart disease, or kidney disease.  Who are age 30  or older.  Who are disabled.  Who live in a place with high altitude.  Who play endurance sports. What are the signs or symptoms? Symptoms of mild dehydration may include:  Thirst.  Dry lips.  Slightly dry mouth.  Dry, warm skin.  Dizziness. Symptoms of moderate dehydration may include:  Very dry mouth.  Muscle cramps.  Dark urine. Urine may be the color of tea.  Decreased urine production.  Decreased tear production.  Heartbeat that is irregular or faster than normal (palpitations).  Headache.  Light-headedness, especially when you stand up from a sitting position.  Fainting (syncope). Symptoms of severe dehydration may include:  Changes in skin, such as:  Cold and clammy skin.  Blotchy (mottled) or pale skin.  Skin that does not quickly return to normal after being lightly pinched and released (poor skin turgor).  Changes in body fluids, such as:  Extreme thirst.  No tear production.  Inability to sweat when body temperature is high, such as in hot weather.  Very little urine production.  Changes in vital signs, such as:  Weak pulse.  Pulse that is more than 100 beats a minute when sitting still.  Rapid breathing.  Low blood pressure.  Other changes, such as:  Sunken eyes.  Cold hands and feet.  Confusion.  Lack of energy (lethargy).  Difficulty waking up from sleep.  Short-term weight loss.  Unconsciousness. How is this diagnosed? This condition is diagnosed based on your symptoms and a physical exam. Blood and urine tests may be done to help confirm the diagnosis. How  is this treated? Treatment for this condition depends on the severity. Mild or moderate dehydration can often be treated at home. Treatment should be started right away. Do not wait until dehydration becomes severe. Severe dehydration is an emergency and it needs to be treated in a hospital. Treatment for mild dehydration may include:  Drinking more  fluids.  Replacing salts and minerals in your blood (electrolytes) that you may have lost. Treatment for moderate dehydration may include:  Drinking an oral rehydration solution (ORS). This is a drink that helps you replace fluids and electrolytes (rehydrate). It can be found at pharmacies and retail stores. Treatment for severe dehydration may include:  Receiving fluids through an IV tube.  Receiving an electrolyte solution through a feeding tube that is passed through your nose and into your stomach (nasogastric tube, or NG tube).  Correcting any abnormalities in electrolytes.  Treating the underlying cause of dehydration. Follow these instructions at home:  If directed by your health care provider, drink an ORS:  Make an ORS by following instructions on the package.  Start by drinking small amounts, about  cup (120 mL) every 5-10 minutes.  Slowly increase how much you drink until you have taken the amount recommended by your health care provider.  Drink enough clear fluid to keep your urine clear or pale yellow. If you were told to drink an ORS, finish the ORS first, then start slowly drinking other clear fluids. Drink fluids such as:  Water. Do not drink only water. Doing that can lead to having too little salt (sodium) in the body (hyponatremia).  Ice chips.  Fruit juice that you have added water to (diluted fruit juice).  Low-calorie sports drinks.  Avoid:  Alcohol.  Drinks that contain a lot of sugar. These include high-calorie sports drinks, fruit juice that is not diluted, and soda.  Caffeine.  Foods that are greasy or contain a lot of fat or sugar.  Take over-the-counter and prescription medicines only as told by your health care provider.  Do not take sodium tablets. This can lead to having too much sodium in the body (hypernatremia).  Eat foods that contain a healthy balance of electrolytes, such as bananas, oranges, potatoes, tomatoes, and  spinach.  Keep all follow-up visits as told by your health care provider. This is important. Contact a health care provider if:  You have abdominal pain that:  Gets worse.  Stays in one area (localizes).  You have a rash.  You have a stiff neck.  You are more irritable than usual.  You are sleepier or more difficult to wake up than usual.  You feel weak or dizzy.  You feel very thirsty.  You have urinated only a small amount of very dark urine over 6-8 hours. Get help right away if:  You have symptoms of severe dehydration.  You cannot drink fluids without vomiting.  Your symptoms get worse with treatment.  You have a fever.  You have a severe headache.  You have vomiting or diarrhea that:  Gets worse.  Does not go away.  You have blood or green matter (bile) in your vomit.  You have blood in your stool. This may cause stool to look black and tarry.  You have not urinated in 6-8 hours.  You faint.  Your heart rate while sitting still is over 100 beats a minute.  You have trouble breathing. This information is not intended to replace advice given to you by your health care provider. Make  sure you discuss any questions you have with your health care provider. Document Released: 11/29/2005 Document Revised: 06/25/2016 Document Reviewed: 01/23/2016 Elsevier Interactive Patient Education  2017 [employer].

## 2017-02-01 ENCOUNTER — Ambulatory Visit (INDEPENDENT_AMBULATORY_CARE_PROVIDER_SITE_OTHER): Payer: 59 | Admitting: Family Medicine

## 2017-02-01 VITALS — BP 130/80 | HR 92 | Temp 98.0°F | Resp 18 | Ht 69.5 in | Wt 274.0 lb

## 2017-02-01 DIAGNOSIS — R110 Nausea: Secondary | ICD-10-CM | POA: Diagnosis not present

## 2017-02-01 DIAGNOSIS — R740 Nonspecific elevation of levels of transaminase and lactic acid dehydrogenase [LDH]: Secondary | ICD-10-CM | POA: Diagnosis not present

## 2017-02-01 DIAGNOSIS — E860 Dehydration: Secondary | ICD-10-CM

## 2017-02-01 DIAGNOSIS — R7401 Elevation of levels of liver transaminase levels: Secondary | ICD-10-CM

## 2017-02-01 LAB — POCT CBC
Granulocyte percent: 25.8 %G — AB (ref 37–80)
HEMATOCRIT: 40.5 % (ref 37.7–47.9)
Hemoglobin: 14.1 g/dL (ref 12.2–16.2)
LYMPH, POC: 4.7 — AB (ref 0.6–3.4)
MCH: 29.6 pg (ref 27–31.2)
MCHC: 34.9 g/dL (ref 31.8–35.4)
MCV: 84.8 fL (ref 80–97)
MID (CBC): 0.8 (ref 0–0.9)
MPV: 7.6 fL (ref 0–99.8)
POC GRANULOCYTE: 1.9 — AB (ref 2–6.9)
POC LYMPH PERCENT: 63.7 %L — AB (ref 10–50)
POC MID %: 10.5 % (ref 0–12)
Platelet Count, POC: 188 10*3/uL (ref 142–424)
RBC: 4.78 M/uL (ref 4.04–5.48)
RDW, POC: 14.2 %
WBC: 7.3 10*3/uL (ref 4.6–10.2)

## 2017-02-01 LAB — COMPREHENSIVE METABOLIC PANEL
A/G RATIO: 1.1 — AB (ref 1.2–2.2)
ALT: 627 IU/L (ref 0–32)
ALT: 602 IU/L (ref 0–32)
AST: 295 IU/L — ABNORMAL HIGH (ref 0–40)
AST: 375 IU/L — ABNORMAL HIGH (ref 0–40)
Albumin/Globulin Ratio: 1.1 — ABNORMAL LOW (ref 1.2–2.2)
Albumin: 3.6 g/dL (ref 3.5–5.5)
Albumin: 3.5 g/dL (ref 3.5–5.5)
Alkaline Phosphatase: 427 IU/L — ABNORMAL HIGH (ref 39–117)
Alkaline Phosphatase: 524 IU/L — ABNORMAL HIGH (ref 39–117)
BILIRUBIN TOTAL: 5.4 mg/dL — AB (ref 0.0–1.2)
BUN / CREAT RATIO: 15 (ref 9–23)
BUN / CREAT RATIO: 23 (ref 9–23)
BUN: 11 mg/dL (ref 6–20)
BUN: 15 mg/dL (ref 6–20)
Bilirubin Total: 4.6 mg/dL — ABNORMAL HIGH (ref 0.0–1.2)
CALCIUM: 9 mg/dL (ref 8.7–10.2)
CALCIUM: 9.3 mg/dL (ref 8.7–10.2)
CHLORIDE: 95 mmol/L — AB (ref 96–106)
CO2: 24 mmol/L (ref 18–29)
CO2: 29 mmol/L (ref 18–29)
Chloride: 91 mmol/L — ABNORMAL LOW (ref 96–106)
Creatinine, Ser: 0.73 mg/dL (ref 0.57–1.00)
Creatinine, Ser: 0.66 mg/dL (ref 0.57–1.00)
GFR, EST AFRICAN AMERICAN: 129 mL/min/{1.73_m2} (ref >59)
GFR, EST AFRICAN AMERICAN: 138 (ref >59)
GFR, EST NON AFRICAN AMERICAN: 112 mL/min/{1.73_m2} (ref >59)
GFR, EST NON AFRICAN AMERICAN: 120 (ref >59)
GLOBULIN, TOTAL: 3.4 g/dL (ref 1.5–4.5)
GLOBULIN, TOTAL: 3.2 (ref 1.5–4.5)
Glucose: 95 mg/dL (ref 65–99)
Glucose: 110 mg/dL — ABNORMAL HIGH (ref 65–99)
POTASSIUM: 4.2 mmol/L (ref 3.5–5.2)
POTASSIUM: 3.9 mmol/L (ref 3.5–5.2)
SODIUM: 134 mmol/L (ref 134–144)
SODIUM: 131 mmol/L — AB (ref 134–144)
TOTAL PROTEIN: 7 g/dL (ref 6.0–8.5)
TOTAL PROTEIN: 6.7 g/dL (ref 6.0–8.5)

## 2017-02-01 LAB — LIPID PANEL
CHOL/HDL RATIO: 20.3 ratio — AB (ref 0.0–4.4)
Cholesterol, Total: 244 mg/dL — ABNORMAL HIGH (ref 100–199)
HDL: 12 mg/dL — AB (ref >39)
LDL Calculated: 160 mg/dL — ABNORMAL HIGH (ref 0–99)
Triglycerides: 358 mg/dL — ABNORMAL HIGH (ref 0–149)
VLDL Cholesterol Cal: 72 mg/dL — ABNORMAL HIGH (ref 5–40)

## 2017-02-01 LAB — POCT URINALYSIS DIP (MANUAL ENTRY)
GLUCOSE UA: NEGATIVE
Ketones, POC UA: NEGATIVE
Leukocytes, UA: NEGATIVE
NITRITE UA: NEGATIVE
RBC UA: NEGATIVE
SPEC GRAV UA: 1.02
UROBILINOGEN UA: 2
pH, UA: 7

## 2017-02-01 LAB — RPR: RPR Ser Ql: NONREACTIVE

## 2017-02-01 LAB — POCT URINE PREGNANCY: Preg Test, Ur: NEGATIVE

## 2017-02-01 LAB — CBC
HEMATOCRIT: 42.9 % (ref 34.0–46.6)
Hemoglobin: 14.6 g/dL (ref 11.1–15.9)
MCH: 29.2 pg (ref 26.6–33.0)
MCHC: 34 g/dL (ref 31.5–35.7)
MCV: 86 fL (ref 79–97)
Platelets: 173 10*3/uL (ref 150–379)
RBC: 5 x10E6/uL (ref 3.77–5.28)
RDW: 14.6 % (ref 12.3–15.4)
WBC: 6.6 10*3/uL (ref 3.4–10.8)

## 2017-02-01 LAB — HIV ANTIBODY (ROUTINE TESTING W REFLEX): HIV Screen 4th Generation wRfx: NONREACTIVE

## 2017-02-01 LAB — TSH: TSH: 2.1 u[IU]/mL (ref 0.450–4.500)

## 2017-02-01 LAB — HEPATITIS C ANTIBODY

## 2017-02-01 MED ORDER — ONDANSETRON 4 MG PO TBDP
8.0000 mg | ORAL_TABLET | Freq: Once | ORAL | Status: AC
  Administered 2017-02-01: 8 mg via ORAL

## 2017-02-01 NOTE — Progress Notes (Signed)
sj

## 2017-02-01 NOTE — Progress Notes (Signed)
Iv started in left hand 22 gauge normal saline open

## 2017-02-01 NOTE — Patient Instructions (Addendum)
NO ALCOHOL.  NO TYLENOL/ACETAMINOPHEN. KEEP TAKING THE PROMETHAZINE. STOP THE SIMVASTATIN. RECHECK TOMORROW.    IF you received an x-ray today, you will receive an invoice from [facility]. Please contact [facility] at [phone] with questions or concerns regarding your invoice.   IF you received labwork today, you will receive an invoice from [facility]. Please contact [facility] at [phone] with questions or concerns regarding your invoice.   Our billing staff will not be able to assist you with questions regarding bills from these companies.  You will be contacted with the lab results as soon as they are available. The fastest way to get your results is to activate your My Chart account. Instructions are located on the last page of this paperwork. If you have not heard from [place] regarding the results in 2 weeks, please contact this office.    Diet and Hepatitis What do I need to know about this diet?  Limit foods that are high in fat, sugar, and salt (sodium). Do not add extra salt to food.  Avoid processed foods. Check food labels for dietary information.  Do not eat uncooked shellfish.  Avoid dehydration by drinking enough fluid to keep your urine clear or pale yellow or as directed. Limit your fluid intake only if recommended by your health care provider.  Grill, boil, or bake foods instead of frying. Do not use pots and pans made of iron.  Try eating frequent small meals instead of three large meals each day.  Avoid or limit drinks that contain alcohol, such as beer, wine, and hard liquor.  Maintain a clean meal preparation and eating space. Wash your hands before and after preparing food and eating.  If you are underweight, your health care provider may recommend a high-calorie diet.  Take vitamin and mineral supplements only as directed by your dietitian.  Make any dietary changes that are recommended by your health care provider or your dietitian.  You may need to adjust the amount of certain foods and substances that you eat. What foods can I eat? Grains  Whole-grain bread, tortillas, cereals, and pasta. Brown rice. Oats and oatmeal. Vegetables  Carrots, broccoli, cabbage, celery, cucumbers, and potatoes. Frozen vegetables. Low-sodium canned vegetables. Fruits  Apples, bananas, pears, apricots, grapes, and cherries. Canned fruit (in juice or water). Meats and Other Protein Sources  Lean meat and poultry. Fish. Tofu. Eggs. Nuts and nut butters. Beans. Dairy  Low-fat yogurt. Low-fat cottage cheese. Low-fat cheese. Milk shakes. Beverages  Water. Low-fat milk. 100% fruit juice. Low-sodium vegetable juice. Smoothies. Herbal tea. Condiments  Low-fat mayonnaise. Low-sodium soy sauce. Sweets and Desserts  Low-fat cookies. Low-fat bran muffins. Fats and Oils  Canola oil, olive oil, corn oil, sunflower oil, peanut oil, and flaxseed oil. Other  Baked chips. Whole-grain crackers. Powdered protein supplements (check label for sodium, fat, and iron content). The items listed above may not be a complete list of recommended foods or beverages. Contact your dietitian for more options.   What foods are not recommended? Food adjustments will be different for each person with chronic hepatitis infection. Be sure to see a dietitian who can help you determine the specific adjustments that you will need to make for each of the food groups. Foods and food ingredients that are commonly not recommended include: Grains  Iron-fortified cereals and breads. Vegetables  Fermented vegetables, such as sauerkraut and pickles. Canned vegetables that are high in sodium. Dark green leafy vegetables, such as spinach and kale. Fruits  Raisins. Meats and  Other Protein Sources  Red meat. Pork. Chicken. Shellfish. Fish. Beans. Salted nuts. Salted or cured meats. Condiments  Ketchup. Mustard. Barbecue sauce. Fats and Oils  Animal fats, such as butter, lard, or  ghee. Fruit oils, such as coconut oil or avocado oil. Other  Salted snacks, such as potato chips or pretzels. Canned soups. Frozen dinners. Processed foods. Multivitamins and supplements that contain iron. The items listed above may not be a complete list of foods and beverages to avoid. Contact your dietitian for more information.  This information is not intended to replace advice given to you by your health care provider. Make sure you discuss any questions you have with your health care provider. Document Released: 11/29/2005 Document Revised: 05/06/2016 Document Reviewed: 06/26/2014  2017 Elsevier

## 2017-02-01 NOTE — Progress Notes (Addendum)
Subjective:  This chart was scribed for  Norberto Sorenson MD, by Veverly Fells, at [facility].  This patient was seen in room 6 and the patient's care was started at 12:36 PM.   Chief Complaint  Patient presents with  . Follow-up    flu  . Headache     Patient ID: Rose Cortez, female    DOB: [birth year], 31 y.o.   MRN: [id number]  HPI  HPI Comments: Rose Cortez is a 31 y.o. female who presents to the [facility] for a follow up. She was seen here yesterday with dehydration and was diagnosed with the flu last week 3d prior at [facility]. She has had a severe headache throughout the duration of her illness. Initially she was treated with Toradol 60 mg IM 13 days prior and as her headache persisted yesterday she was administered Benadryl 50 mg IM and Depo-Medrol 80 mg IM as well as started on promethazine and azithromycin since her main symptom other than the headache is ongoing diaphoresis and severe nonproductive hacking cough.   Patient is complaining of a slight headache today but states that its not as bad as it was before yeaterday.  Patient was able to eat a full meal this morning (has not been able to the last couple of days.)  She has a slight irritation in her throat but denies any severe pain. Patient has not noticed the yellowing of the whites of her eyes. Denies rash, change in skin tone, or itching. She has not been nauseas since her visit to [facility] yesterday but has been taking promethazine as prescribed.  She had a small bowel movement this morning which looked normal though her urine is still dark brown.   She has not been taking tylenol/APAP or otc cough/cold meds other than the past two days she has been taking Tylenol PM nightly.    Patient does not drink alcohol.  She denies any liver conditions in the family. 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  Past Medical History:  Diagnosis Date  . Anxiety   . Depression   .  Hyperlipidemia     Current Outpatient Prescriptions on File Prior to Visit  Medication Sig Dispense Refill  . azithromycin (ZITHROMAX) 250 MG tablet Take 2 tabs PO x 1 dose, then 1 tab PO QD x 4 days 6 tablet 0  . chlorpheniramine-HYDROcodone (TUSSIONEX PENNKINETIC ER) 10-8 MG/5ML SUER Take 5 mLs by mouth every 12 (twelve) hours as needed. 120 mL 0  . cholecalciferol (VITAMIN D) 1000 UNITS tablet Take 5,000 Units by mouth daily.    . citalopram (CELEXA) 20 MG tablet TAKE 1 TABLET BY MOUTH  DAILY 90 tablet 0  . Fish Oil OIL by Does not apply route.    . nystatin (MYCOSTATIN/NYSTOP) 100000 UNIT/GM POWD Apply 5 g topically 3 (three) times daily. 60 g 2  . promethazine (PHENERGAN) 25 MG tablet Take 1 tablet (25 mg total) by mouth every 8 (eight) hours as needed for nausea or vomiting. 20 tablet 0  . simvastatin (ZOCOR) 40 MG tablet TAKE 1 TABLET BY MOUTH  DAILY 90 tablet 0  . Melatonin 5 MG TABS Take by mouth. Reported on 05/27/2016    . propranolol ER (INDERAL LA) 80 MG 24 hr capsule Take 1 capsule (80 mg total) by mouth daily. (Patient not taking: Reported on 02/01/2017) 90 capsule 0   No current facility-administered medications on file prior to visit.  No Known Allergies   Review of Systems  Constitutional: Positive for activity change, appetite change, chills, diaphoresis and fatigue. Negative for fever and unexpected weight change.  Eyes: Negative for photophobia, pain, redness, itching and visual disturbance.  Respiratory: Negative for cough, choking and shortness of breath.   Gastrointestinal: Positive for nausea. Negative for abdominal distention, abdominal pain, anal bleeding, blood in stool, constipation, diarrhea and vomiting.  Endocrine: Negative for polydipsia, polyphagia and polyuria.  Genitourinary: Positive for decreased urine volume and difficulty urinating. Negative for dysuria, flank pain, frequency, pelvic pain and urgency.  Skin: Negative for color change, pallor and  rash.  Neurological: Positive for weakness, light-headedness and headaches. Negative for dizziness, facial asymmetry and speech difficulty.  Hematological: Negative for adenopathy.  Psychiatric/Behavioral: Positive for sleep disturbance.       Objective:   Physical Exam  Constitutional: She is oriented to person, place, and time. She appears well-developed and well-nourished. She appears ill. No distress.  clammy  HENT:  Head: Normocephalic and atraumatic.  Eyes: EOM and lids are normal. Scleral icterus is present.  Neck: Normal range of motion. Neck supple. No thyromegaly present.  Cardiovascular: Normal rate, regular rhythm, normal heart sounds and intact distal pulses.   Pulmonary/Chest: Effort normal and breath sounds normal. No respiratory distress.  Abdominal: Soft. Normal appearance and bowel sounds are normal. She exhibits no distension. There is no hepatosplenomegaly. There is no tenderness. There is no rebound, no guarding, no CVA tenderness, no tenderness at McBurney's point and negative Murphy's sign. No hernia.  Musculoskeletal: She exhibits no edema.  Lymphadenopathy:    She has no cervical adenopathy.  Neurological: She is alert and oriented to person, place, and time.  Skin: Skin is warm. No rash noted. She is diaphoretic. No erythema. There is pallor.  Psychiatric: She has a normal mood and affect. Her behavior is normal.   Vitals:   02/01/17 1218  BP: (!) 142/80  Pulse: (!) 121  Resp: 18  Temp: 98 F (36.7 C)  TempSrc: Oral  SpO2: 97%  Weight: 274 lb (124.3 kg)  Height: 5' 9.5" (1.765 m)        Assessment & Plan:   1. Dehydration   2. Transaminitis   3. Nausea without vomiting    Pt has been acutely ill with fever, fatigue, cough, nausea/vomiting, and dark urine for > 1wk.  [facility] and tested + for influenza B but outside of treatment range. She was given toradol IM to break her headache which was unsuccessful. She had sig improvement in HA  yest after benadryl and depomedrol IM. She was started on zpack to cover for walking pneumonia or pertussis and scheduled promethazine so that she can push fluids. Still dehydrated today on presentation with scleral icterus noted. HOwever otherwise she is feeling much better and her HA resolved after 3L IVF in the office today. Reviewed w/ pt that she has an acute hepatitis - presumed viral. We will continue to monitor her closely/daily in the office until we are confident in its resolution - she is now relatively asymptomatic and as no liver tenderness and tolerating po well I suspect that she is turning the corner. If worse at all -> to ER. Suspect this is EBV.  Stop statin, no apap. Called [facility] for curbside consult. Consider need for imaging - doppler US vs CT.  Orders Placed This Encounter  Procedures  . Comprehensive metabolic panel    Order Specific Question:   Has the  patient fasted?    Answer:   No  . Epstein-Barr virus VCA antibody panel  . Hepatitis panel, acute  . Acetaminophen level  . ToxASSURE Select 13 (MW), Urine  . hCG, serum, qualitative  . Ambulatory referral to Gastroenterology    Referral Priority:   Emergency    Referral Type:   Consultation    Referral Reason:   Specialty Services Required    Number of Visits Requested:   1  . POCT urinalysis dipstick  . POCT CBC  . POCT urine pregnancy  . Insert peripheral IV    Meds ordered this encounter  Medications  . ondansetron (ZOFRAN-ODT) disintegrating tablet 8 mg   Over 40 min spent in face-to-face evaluation of and consultation with patient and coordination of care.  Over 50% of this time was spent counseling this patient.  I personally performed the services described in this documentation, which was scribed in my presence. The recorded information has been reviewed and considered, and addended by me as needed.   Norberto Sorenson, M.D.  Primary Care at [facility] [address] [place], [place]  [address] [phone] phone [phone] fax  02/01/17 3:13 PM

## 2017-02-02 ENCOUNTER — Ambulatory Visit (HOSPITAL_BASED_OUTPATIENT_CLINIC_OR_DEPARTMENT_OTHER): Admission: RE | Admit: 2017-02-02 | Payer: 59 | Source: Ambulatory Visit

## 2017-02-02 ENCOUNTER — Other Ambulatory Visit: Payer: Self-pay | Admitting: Family Medicine

## 2017-02-02 ENCOUNTER — Ambulatory Visit (INDEPENDENT_AMBULATORY_CARE_PROVIDER_SITE_OTHER): Payer: 59 | Admitting: Physician Assistant

## 2017-02-02 ENCOUNTER — Ambulatory Visit (HOSPITAL_COMMUNITY): Payer: 59

## 2017-02-02 VITALS — BP 140/90 | HR 128 | Temp 98.1°F | Resp 18 | Ht 69.5 in | Wt 277.0 lb

## 2017-02-02 DIAGNOSIS — B199 Unspecified viral hepatitis without hepatic coma: Secondary | ICD-10-CM

## 2017-02-02 DIAGNOSIS — R519 Headache, unspecified: Secondary | ICD-10-CM

## 2017-02-02 DIAGNOSIS — E860 Dehydration: Secondary | ICD-10-CM

## 2017-02-02 DIAGNOSIS — R51 Headache: Secondary | ICD-10-CM

## 2017-02-02 DIAGNOSIS — R740 Nonspecific elevation of levels of transaminase and lactic acid dehydrogenase [LDH]: Secondary | ICD-10-CM

## 2017-02-02 DIAGNOSIS — R7401 Elevation of levels of liver transaminase levels: Secondary | ICD-10-CM

## 2017-02-02 LAB — POCT URINALYSIS DIP (MANUAL ENTRY)
Bilirubin, UA: NEGATIVE
Blood, UA: NEGATIVE
Glucose, UA: NEGATIVE
Ketones, POC UA: NEGATIVE
Leukocytes, UA: NEGATIVE
Nitrite, UA: NEGATIVE
Protein Ur, POC: NEGATIVE
Spec Grav, UA: <=1.005
Urobilinogen, UA: 0.2
pH, UA: 6

## 2017-02-02 LAB — POCT CBC
Granulocyte percent: 31.5 %G — AB (ref 37–80)
HCT, POC: 39.5 % (ref 37.7–47.9)
Hemoglobin: 13.8 g/dL (ref 12.2–16.2)
Lymph, poc: 4.2 — AB (ref 0.6–3.4)
MCH, POC: 30 pg (ref 27–31.2)
MCHC: 34.8 g/dL (ref 31.8–35.4)
MCV: 86 fL (ref 80–97)
MID (cbc): 0.8 (ref 0–0.9)
MPV: 7.7 fL (ref 0–99.8)
POC Granulocyte: 2.3 (ref 2–6.9)
POC LYMPH PERCENT: 58.1 %L — AB (ref 10–50)
POC MID %: 10.4 %M (ref 0–12)
Platelet Count, POC: 188 10*3/uL (ref 142–424)
RBC: 4.59 M/uL (ref 4.04–5.48)
RDW, POC: 14.4 %
WBC: 7.3 10*3/uL (ref 4.6–10.2)

## 2017-02-02 LAB — SEDIMENTATION RATE: SED RATE: 50 mm/h — AB (ref 0–32)

## 2017-02-02 LAB — ANA W/REFLEX IF POSITIVE: Anti Nuclear Antibody(ANA): NEGATIVE

## 2017-02-02 MED ORDER — KETOROLAC TROMETHAMINE 60 MG/2ML IM SOLN
60.0000 mg | Freq: Once | INTRAMUSCULAR | Status: AC
  Administered 2017-02-02: 60 mg via INTRAMUSCULAR

## 2017-02-02 NOTE — Patient Instructions (Addendum)
Rose Cortez, [place], [place] [address] at 11:45a. Please do not eat or drink anything past 6:00am tomorrow morning.   Please schedule an appt with Dr. Brigitte Pulse tomorrow at [address].  Stay well hydrated. Drink 2-3 liters of water.   Thank you for coming in today. I hope you feel we met your needs.  Feel free to call [facility] if you have any questions or further requests.  Please consider signing up for MyChart if you do not already have it, as this is a great way to communicate with me.  Best,  [name], PA-C   IF you received an x-ray today, you will receive an invoice from [facility]. Please contact [facility] at [phone] with questions or concerns regarding your invoice.   IF you received labwork today, you will receive an invoice from [facility]. Please contact [facility] at [phone] with questions or concerns regarding your invoice.   Our billing staff will not be able to assist you with questions regarding bills from these companies.  You will be contacted with the lab results as soon as they are available. The fastest way to get your results is to activate your My Chart account. Instructions are located on the last page of this paperwork. If you have not heard from [place] regarding the results in 2 weeks, please contact this office.

## 2017-02-02 NOTE — Progress Notes (Signed)
Rose Cortez  MRN: [id number] DOB: 11-25-1986  PCP: Pcp Not In System  Subjective:  Pt is a 31 year old female PMH GAD, obesity, HLD and depression who presents to clinic for f/u dehydration.  She was diagnosed with the flu B 5 days ago at [facility]. She has presented to PCP 2x in the past three days for complications. At home, she was taking Tylenol, aleve, liquid Tylenol and NyQuil. She has been treated for persistent headache and dry hacking coughwith Toradol 73m IM, Benadryl 562mIM  and DepoMedrol 8031mpromethazine and Azithromycin.  OV on 2/19 she appeared ill, diaphoretic and lethargic, as per Dr. ShaRaul Delte. She was treated with Benadryl and Depomedrol Im. Rx promethazine and advised to push fluids.  OV 2/20 pt returned for f/u intractable headache and dehydration. Appeared ill with scleral icterus, pallor, diaphoresis, fever, and dark urine.    Today she states she woke up with a 6/10 headache. Her headache yesterday was 8/10.   Review of Systems  Constitutional: Negative for chills, diaphoresis, fatigue and fever.  Respiratory: Negative for cough, chest tightness, shortness of breath and wheezing.   Cardiovascular: Negative for chest pain and palpitations.  Gastrointestinal: Negative for abdominal distention, abdominal pain, diarrhea, nausea and vomiting.  Neurological: Positive for headaches. Negative for weakness and light-headedness.    Patient Active Problem List   Diagnosis Date Noted  . Depression 08/20/2016  . GAD (generalized anxiety disorder) 04/14/2015  . Obesity 04/14/2015  . Dyslipidemia 04/14/2015    Current Outpatient Prescriptions on File Prior to Visit  Medication Sig Dispense Refill  . azithromycin (ZITHROMAX) 250 MG tablet Take 2 tabs PO x 1 dose, then 1 tab PO QD x 4 days 6 tablet 0  . chlorpheniramine-HYDROcodone (TUSSIONEX PENNKINETIC ER) 10-8 MG/5ML SUER Take 5 mLs by mouth every 12 (twelve) hours as needed. 120 mL 0  . cholecalciferol (VITAMIN  D) 1000 UNITS tablet Take 5,000 Units by mouth daily.    . citalopram (CELEXA) 20 MG tablet TAKE 1 TABLET BY MOUTH  DAILY 90 tablet 0  . Fish Oil OIL by Does not apply route.    . Melatonin 5 MG TABS Take by mouth. Reported on 05/27/2016    . nystatin (MYCOSTATIN/NYSTOP) 100000 UNIT/GM POWD Apply 5 g topically 3 (three) times daily. 60 g 2  . promethazine (PHENERGAN) 25 MG tablet Take 1 tablet (25 mg total) by mouth every 8 (eight) hours as needed for nausea or vomiting. 20 tablet 0  . propranolol ER (INDERAL LA) 80 MG 24 hr capsule Take 1 capsule (80 mg total) by mouth daily. 90 capsule 0  . simvastatin (ZOCOR) 40 MG tablet TAKE 1 TABLET BY MOUTH  DAILY 90 tablet 0   No current facility-administered medications on file prior to visit.     No Known Allergies   Objective:  BP 140/90 (BP Location: Right Arm, Patient Position: Sitting, Cuff Size: Small)   Pulse (!) 128   Temp 98.1 F (36.7 C) (Oral)   Resp 18   Ht 5' 9.5" (1.765 m)   Wt 277 lb (125.6 kg)   LMP 01/15/2017   SpO2 95%   BMI 40.32 kg/m   Physical Exam  Constitutional: She is oriented to person, place, and time and well-developed, well-nourished, and in no distress. No distress.  Cardiovascular: Normal rate, regular rhythm and normal heart sounds.   Pulmonary/Chest: Effort normal and breath sounds normal. No respiratory distress.  Abdominal: Soft. There is no tenderness.  Neurological: She  is alert and oriented to person, place, and time. GCS score is 15.  Skin: Skin is warm and dry.  Psychiatric: Mood, memory, affect and judgment normal.  Vitals reviewed.   Assessment and Plan :  This case was discussed with Dr. Brigitte Pulse.   1. Dehydration - CMP14+EGFR - POCT CBC - POCT urinalysis dipstick - PR NORMAL SALINE SOLUTION INFUS - PR CATH IMPL VASC ACCESS PORTAL - Protime-INR - HSV(herpes smplx)abs-1+2(IgG+IgM)-bld - US Abdomen Limited RUQ; Future - ketorolac (TORADOL) injection 60 mg; Inject 2 mLs (60 mg total) into  the muscle once. - [place] Art/Ven Flow Abd Pelv Doppler - UA and CBC is improving compared to yesterday. She reports improvement following Toradol and IVF. Labs are pending. F/u with Dr. Brigitte Pulse tomorrow morning.   2. Transaminitis - CMP14+EGFR - POCT CBC - POCT urinalysis dipstick - PR NORMAL SALINE SOLUTION INFUS - PR CATH IMPL VASC ACCESS PORTAL - Protime-INR - HSV(herpes smplx)abs-1+2(IgG+IgM)-bld - US Abdomen Limited RUQ; Future - ketorolac (TORADOL) injection 60 mg; Inject 2 mLs (60 mg total) into the muscle once. - [place] Art/Ven Flow Abd Pelv Doppler  3. Intractable headache, unspecified chronicity pattern, unspecified headache type - CMP14+EGFR - POCT CBC - POCT urinalysis dipstick - PR NORMAL SALINE SOLUTION INFUS - PR CATH IMPL VASC ACCESS PORTAL - Protime-INR - HSV(herpes smplx)abs-1+2(IgG+IgM)-bld - US Abdomen Limited RUQ; Future - ketorolac (TORADOL) injection 60 mg; Inject 2 mLs (60 mg total) into the muscle once. - [place] Art/Ven Flow Abd Pelv Doppler - Pt reports improvement after Toradol   4. Viral hepatitis without hepatic coma, unspecified chronicity, unspecified viral hepatitis type - CMP14+EGFR - POCT CBC - POCT urinalysis dipstick - PR NORMAL SALINE SOLUTION INFUS - PR CATH IMPL VASC ACCESS PORTAL - Protime-INR - HSV(herpes smplx)abs-1+2(IgG+IgM)-bld - US Abdomen Limited RUQ; Future - ketorolac (TORADOL) injection 60 mg; Inject 2 mLs (60 mg total) into the muscle once. - [place] Art/Ven Flow Abd Pelv Doppler   Mercer Pod, PA-C  Primary Care at [facility] 02/02/2017 8:22 AM

## 2017-02-03 ENCOUNTER — Ambulatory Visit (HOSPITAL_COMMUNITY)
Admission: RE | Admit: 2017-02-03 | Discharge: 2017-02-03 | Disposition: A | Discharge status: Home / Self Care | Payer: 59 | Source: Ambulatory Visit | Attending: Physician Assistant | Admitting: Physician Assistant

## 2017-02-03 ENCOUNTER — Ambulatory Visit (INDEPENDENT_AMBULATORY_CARE_PROVIDER_SITE_OTHER): Payer: 59 | Admitting: Family Medicine

## 2017-02-03 ENCOUNTER — Other Ambulatory Visit: Payer: Self-pay | Admitting: Family Medicine

## 2017-02-03 ENCOUNTER — Encounter: Payer: Self-pay | Admitting: Family Medicine

## 2017-02-03 VITALS — BP 148/100 | HR 123 | Temp 99.3°F | Resp 16 | Ht 69.0 in | Wt 277.0 lb

## 2017-02-03 DIAGNOSIS — R740 Nonspecific elevation of levels of transaminase and lactic acid dehydrogenase [LDH]: Secondary | ICD-10-CM | POA: Diagnosis present

## 2017-02-03 DIAGNOSIS — B199 Unspecified viral hepatitis without hepatic coma: Secondary | ICD-10-CM

## 2017-02-03 DIAGNOSIS — E860 Dehydration: Secondary | ICD-10-CM | POA: Diagnosis not present

## 2017-02-03 DIAGNOSIS — B179 Acute viral hepatitis, unspecified: Secondary | ICD-10-CM | POA: Diagnosis not present

## 2017-02-03 DIAGNOSIS — R51 Headache: Secondary | ICD-10-CM | POA: Diagnosis not present

## 2017-02-03 DIAGNOSIS — B2709 Gammaherpesviral mononucleosis with other complications: Secondary | ICD-10-CM | POA: Diagnosis not present

## 2017-02-03 DIAGNOSIS — R519 Headache, unspecified: Secondary | ICD-10-CM

## 2017-02-03 DIAGNOSIS — R7401 Elevation of levels of liver transaminase levels: Secondary | ICD-10-CM

## 2017-02-03 LAB — POCT URINALYSIS DIP (MANUAL ENTRY)
BILIRUBIN UA: NEGATIVE
Blood, UA: NEGATIVE
Glucose, UA: NEGATIVE
LEUKOCYTES UA: NEGATIVE
Nitrite, UA: NEGATIVE
Protein Ur, POC: NEGATIVE
SPEC GRAV UA: 1.015
Urobilinogen, UA: 1
pH, UA: 7

## 2017-02-03 LAB — HEPATITIS PANEL, ACUTE
HEP A IGM: NEGATIVE
HEP B C IGM: NEGATIVE
Hepatitis B Surface Ag: NEGATIVE

## 2017-02-03 LAB — CMP14+EGFR
ALT: 564 IU/L (ref 0–32)
AST: 311 IU/L — ABNORMAL HIGH (ref 0–40)
Albumin/Globulin Ratio: 1 — ABNORMAL LOW (ref 1.2–2.2)
Albumin: 3.1 g/dL — ABNORMAL LOW (ref 3.5–5.5)
Alkaline Phosphatase: 512 IU/L — ABNORMAL HIGH (ref 39–117)
BUN/Creatinine Ratio: 17 (ref 9–23)
BUN: 11 mg/dL (ref 6–20)
Bilirubin Total: 4.2 mg/dL — ABNORMAL HIGH (ref 0.0–1.2)
CO2: 25 mmol/L (ref 18–29)
Calcium: 8.5 mg/dL — ABNORMAL LOW (ref 8.7–10.2)
Chloride: 95 mmol/L — ABNORMAL LOW (ref 96–106)
Creatinine, Ser: 0.64 mg/dL (ref 0.57–1.00)
GFR calc Af Amer: 139 (ref >59)
GFR calc non Af Amer: 121 (ref >59)
Globulin, Total: 3.2 (ref 1.5–4.5)
Glucose: 103 mg/dL — ABNORMAL HIGH (ref 65–99)
Potassium: 4 mmol/L (ref 3.5–5.2)
Sodium: 134 mmol/L (ref 134–144)
Total Protein: 6.3 g/dL (ref 6.0–8.5)

## 2017-02-03 LAB — EPSTEIN-BARR VIRUS VCA ANTIBODY PANEL
EBV Early Antigen Ab, IgG: 88.2 U/mL — ABNORMAL HIGH (ref 0.0–8.9)
EBV VCA IGG: 50.6 U/mL — AB (ref 0.0–17.9)
EBV VCA IgM: >160 U/mL — ABNORMAL HIGH (ref 0.0–35.9)

## 2017-02-03 LAB — GC/CHLAMYDIA PROBE AMP

## 2017-02-03 MED ORDER — TRAMADOL HCL 50 MG PO TABS: 50.0000 mg | ORAL_TABLET | Freq: Four times a day (QID) | ORAL | 0 refills | Status: DC | PRN

## 2017-02-03 NOTE — Patient Instructions (Addendum)
IF you received an x-ray today, you will receive an invoice from [facility]. Please contact [facility] at [phone] with questions or concerns regarding your invoice.   IF you received labwork today, you will receive an invoice from [facility]. Please contact [facility] at [phone] with questions or concerns regarding your invoice.   Our billing staff will not be able to assist you with questions regarding bills from these companies.  You will be contacted with the lab results as soon as they are available. The fastest way to get your results is to activate your My Chart account. Instructions are located on the last page of this paperwork. If you have not heard from us regarding the results in 2 weeks, please contact this office.      Infectious Mononucleosis Infectious mononucleosis is a viral infection. It is often referred to as "mono." It causes symptoms that affect various areas of the body, including the throat, upper air passages, and lymph glands. The liver or spleen may also be affected. The virus spreads from person to person (is contagious) through close contact. The illness is usually not serious, and it typically goes away in 2-4 weeks without treatment. In rare cases, symptoms can be more severe and last longer, sometimes up to several months. What are the causes? This condition is commonly caused by the Epstein-Barr virus. This virus spreads through:  Contact with an infected person's saliva or other bodily fluids, often through:  Kissing.  Sexual contact.  Coughing.  Sneezing.  Sharing utensils or drinking glasses that were recently used by an infected person.  Blood transfusions.  Organ transplantation. What increases the risk? You are more likely to develop this condition if:  You are 30-30 years old. What are the signs or symptoms? Symptoms of this condition usually appear 4-6 weeks after infection. Symptoms may develop slowly and  occur at different times. Common symptoms include:  Sore throat.  Headache.  Extreme fatigue.  Muscle aches.  Swollen glands.  Fever.  Poor appetite.  Rash. Other symptoms include:  Enlarged liver or spleen.  Nausea.  Abdominal pain. How is this diagnosed? This condition may be diagnosed based on:  Your medical history.  Your symptoms.  A physical exam.  Blood tests to confirm the diagnosis. How is this treated? There is no cure for this condition. Infectious mononucleosis usually goes away on its own with time. Treatment can help relieve symptoms and may include:  Taking medicines to relieve pain and fever.  Drinking plenty of fluids.  Getting a lot of rest.  Medicine (corticosteroids)to reduce swelling. This may be used if swelling in the throat causes breathing or swallowing problems. In some severe cases, treatment has to be given in a hospital. Follow these instructions at home: Medicines  Take over-the-counter and prescription medicines only as told by your health care provider.  Do not take ampicillin or amoxicillin. This may cause a rash.  If you are under 18, do not take aspirin because of the association with Reye syndrome. Activity  Rest as needed.  Do not participate in any of the following activities until your health care provider approves:  Contact sports. You may need to wait at least a month before participating in sports.  Exercise that requires a lot of energy.  Heavy lifting.  Gradually resume your normal activities after your fever is gone, or when your health care provider tells you that you can. Be sure to rest when you get tired. General instructions  Avoid kissing or sharing utensils or drinking glasses until your health care provider tells you that you are no longer contagious.  Drink enough fluid to keep your urine clear or pale yellow.  Do not drink alcohol.  If you have a sore throat:  Gargle with a salt-water  mixture 3-4 times a day or as needed. To make a salt-water mixture, completely dissolve -1 tsp of salt in 1 cup of warm water.  Eat soft foods. Cold foods such as ice cream or frozen ice pops can soothe a sore throat.  Try sucking on hard candy.  Wash your hands often with soap and water to avoid spreading the infection. If soap and water are not available, use hand sanitizer. How is this prevented?  Avoid contact with people who are infected with mononucleosis. An infected person may not always appear ill, but he or she can still spread the virus.  Avoid sharing utensils, drinking glasses, or toothbrushes.  Wash your hands frequently with soap and water. If soap and water are not available, use hand sanitizer.  Use the inside of your elbow to cover your mouth when coughing or sneezing. Contact a health care provider if:  Your fever is not gone after 10 days.  You have swollen lymph nodes that are not back to normal after 4 weeks.  Your activity level is not back to normal after 2 months.  Your skin or the white parts of your eyes turn yellow (jaundice).  You have constipation. This may mean that you have:  Fewer bowel movements in a week than normal.  Difficulty having a bowel movement.  Stools that are dry, hard, or larger than normal. Get help right away if:  You have severe pain in your abdomen or shoulder.  You are drooling.  You have trouble swallowing.  You have trouble breathing.  You develop a stiff neck.  You develop a severe headache.  You cannot stop vomiting.  You have jerky movements that you cannot control (seizures).  You are confused.  You have trouble with balance.  Your nose or gums begin to bleed.  You have signs of dehydration. These may include:  Weakness.  Sunken eyes.  Pale skin.  Dry mouth.  Rapid breathing or pulse. Summary  Infectious mononucleosis, or "mono," is an infection caused by the Epstein-Barr virus.  The  virus that causes this condition is spread through bodily fluids. The virus is most commonly spread by kissing or sharing drinks or utensils with an infected person.  You are more likely to develop this infection if you are 6115-30 years old.  Symptoms of this condition can include sore throat, headache, fever, swollen glands, muscle aches, extreme fatigue, and swollen liver or spleen.  There is no cure for this condition. The goal of treatment is to help relieve symptoms. Treatment may include drinking plenty of water, getting a lot of rest, and taking pain relievers. This information is not intended to replace advice given to you by your health care provider. Make sure you discuss any questions you have with your health care provider. Document Released: 11/26/2000 Document Revised: 08/17/2016 Document Reviewed: 08/17/2016 Elsevier Interactive Patient Education  2017 [employer]

## 2017-02-03 NOTE — Progress Notes (Signed)
Subjective:    Patient ID: Rose Cortez, female    DOB: Aug 22, 1986, 31 y.o.   MRN: [id number] Chief Complaint  Patient presents with  . Follow-up    dehydration    HPI  Wakes up with a HA  Promethazine helps with nausea TUssionex helps with cough A little photophobia -  toradol given yesterday helped HA but now worsened again and it did not go away. Sleeping a lot.   Past Medical History:  Diagnosis Date  . Anxiety   . Depression   . Hyperlipidemia    History reviewed. No pertinent surgical history. Current Outpatient Prescriptions on File Prior to Visit  Medication Sig Dispense Refill  . chlorpheniramine-HYDROcodone (TUSSIONEX PENNKINETIC ER) 10-8 MG/5ML SUER Take 5 mLs by mouth every 12 (twelve) hours as needed. 120 mL 0  . cholecalciferol (VITAMIN D) 1000 UNITS tablet Take 5,000 Units by mouth daily.    . citalopram (CELEXA) 20 MG tablet TAKE 1 TABLET BY MOUTH  DAILY 90 tablet 0  . Fish Oil OIL by Does not apply route.    . Melatonin 5 MG TABS Take by mouth. Reported on 05/27/2016    . nystatin (MYCOSTATIN/NYSTOP) 100000 UNIT/GM POWD Apply 5 g topically 3 (three) times daily. 60 g 2  . simvastatin (ZOCOR) 40 MG tablet TAKE 1 TABLET BY MOUTH  DAILY 90 tablet 0   No current facility-administered medications on file prior to visit.    No Known Allergies Family History  Problem Relation Age of Onset  . Hyperlipidemia Mother   . Hyperlipidemia Father   . Depression Sister    Social History   Social History  . Marital status: Single    Spouse name: N/A  . Number of children: N/A  . Years of education: N/A   Social History Main Topics  . Smoking status: Never Smoker  . Smokeless tobacco: Never Used  . Alcohol use Yes     Comment: weekly  . Drug use: No  . Sexual activity: Yes   Other Topics Concern  . None   Social History Narrative  . None   Depression screen [facility] 2/9 02/14/2017 02/07/2017 02/03/2017 02/02/2017 02/01/2017  Decreased Interest 0 0 0 0 0  Down,  Depressed, Hopeless 0 0 0 0 0  PHQ - 2 Score 0 0 0 0 0     Review of Systems  Constitutional: Positive for activity change, appetite change, diaphoresis and fatigue. Negative for chills, fever and unexpected weight change.  HENT: Negative for congestion, rhinorrhea, sore throat, trouble swallowing and voice change.   Eyes: Positive for photophobia. Negative for visual disturbance.  Respiratory: Positive for cough. Negative for chest tightness and shortness of breath.   Gastrointestinal: Positive for nausea. Negative for abdominal distention, abdominal pain, constipation, diarrhea and vomiting.  Musculoskeletal: Positive for arthralgias and myalgias.  Skin: Positive for pallor. Negative for color change and rash.  Neurological: Positive for dizziness, weakness, light-headedness and headaches.  Hematological: Negative for adenopathy.  Psychiatric/Behavioral: Positive for sleep disturbance.       Objective:   Physical Exam  Constitutional: She is oriented to person, place, and time. She appears well-developed and well-nourished. She appears lethargic. She appears ill. No distress.  HENT:  Head: Normocephalic and atraumatic.  Right Ear: Tympanic membrane, external ear and ear canal normal.  Left Ear: Tympanic membrane, external ear and ear canal normal.  Nose: Nose normal. No mucosal edema or rhinorrhea.  Mouth/Throat: Uvula is midline, oropharynx is clear and moist and mucous membranes  are normal. No oropharyngeal exudate.  Eyes: Conjunctivae are normal. Right eye exhibits no discharge. Left eye exhibits no discharge. No scleral icterus.  Neck: Normal range of motion. Neck supple.  Cardiovascular: Normal rate, regular rhythm, normal heart sounds and intact distal pulses.   Pulmonary/Chest: Effort normal and breath sounds normal.  Abdominal: Soft. Bowel sounds are normal. She exhibits no distension and no mass. There is no tenderness. There is no rebound and no guarding.    Lymphadenopathy:    She has no cervical adenopathy.  Neurological: She is oriented to person, place, and time. She appears lethargic.  Skin: Skin is warm. She is diaphoretic. No erythema.  Psychiatric: She has a normal mood and affect. Her behavior is normal.          Assessment & Plan:   1. Acute hepatitis in viral disease   2. Gammaherpesviral mononucleosis with other complications   3. Dehydration     Orders Placed This Encounter  Procedures  . Hepatic Function Panel  . POCT urinalysis dipstick    Meds ordered this encounter  Medications  . traMADol (ULTRAM) 50 MG tablet    Sig: Take 1-2 tablets (50-100 mg total) by mouth every 6 (six) hours as needed.    Dispense:  40 tablet    Refill:  0     Norberto SorensonEva Murice Barbar, M.D.  Primary Care at [facility]  [place] [address] [place], [place] [address] [phone] phone [phone] fax  03/10/17 10:35 PM

## 2017-02-04 LAB — HEPATIC FUNCTION PANEL
ALT: 489 IU/L — ABNORMAL HIGH (ref 0–32)
AST: 232 IU/L — AB (ref 0–40)
Albumin: 3.3 g/dL — ABNORMAL LOW (ref 3.5–5.5)
Alkaline Phosphatase: 575 IU/L — ABNORMAL HIGH (ref 39–117)
Bilirubin Total: 2.5 mg/dL — ABNORMAL HIGH (ref 0.0–1.2)
Bilirubin, Direct: 1.63 mg/dL — ABNORMAL HIGH (ref 0.00–0.40)
TOTAL PROTEIN: 7 g/dL (ref 6.0–8.5)

## 2017-02-04 LAB — HSV(HERPES SMPLX)ABS-I+II(IGG+IGM)-BLD
HSV 1 Glycoprotein G Ab, IgG: <0.91 index (ref 0.00–0.90)
HSV 2 Glycoprotein G Ab, IgG: <0.91 index (ref 0.00–0.90)
HSVI/II COMB AB IGM: 1.34 ratio — AB (ref 0.00–0.90)

## 2017-02-04 LAB — PROTIME-INR
INR: 1 (ref 0.8–1.2)
PROTHROMBIN TIME: 10.2 s (ref 9.1–12.0)

## 2017-02-04 LAB — HCG, SERUM, QUALITATIVE: hCG,Beta Subunit,Qual,Serum: NEGATIVE m[IU]/mL (ref <6)

## 2017-02-04 LAB — ACETAMINOPHEN LEVEL: ACETAMINOPHEN (TYLENOL), SERUM: NEGATIVE ug/mL (ref 10–30)

## 2017-02-06 NOTE — Progress Notes (Signed)
Subjective:    Patient ID: Rose Cortez, female    DOB: [birth year], 31 y.o.   MRN: [id number] Chief Complaint  Patient presents with  . Follow-up    dehydration    HPI  She is feeling better. Looking forward to trying to go to work. Still with a little headache in the morning and fatigues very quickly.  Past Medical History:  Diagnosis Date  . Anxiety   . Depression   . Hyperlipidemia    No past surgical history on file. Current Outpatient Prescriptions on File Prior to Visit  Medication Sig Dispense Refill  . chlorpheniramine-HYDROcodone (TUSSIONEX PENNKINETIC ER) 10-8 MG/5ML SUER Take 5 mLs by mouth every 12 (twelve) hours as needed. 120 mL 0  . cholecalciferol (VITAMIN D) 1000 UNITS tablet Take 5,000 Units by mouth daily.    . citalopram (CELEXA) 20 MG tablet TAKE 1 TABLET BY MOUTH  DAILY 90 tablet 0  . Fish Oil OIL by Does not apply route.    . Melatonin 5 MG TABS Take by mouth. Reported on 05/27/2016    . nystatin (MYCOSTATIN/NYSTOP) 100000 UNIT/GM POWD Apply 5 g topically 3 (three) times daily. 60 g 2  . simvastatin (ZOCOR) 40 MG tablet TAKE 1 TABLET BY MOUTH  DAILY 90 tablet 0  . traMADol (ULTRAM) 50 MG tablet Take 1-2 tablets (50-100 mg total) by mouth every 6 (six) hours as needed. 40 tablet 0   No current facility-administered medications on file prior to visit.    No Known Allergies Family History  Problem Relation Age of Onset  . Hyperlipidemia Mother   . Hyperlipidemia Father   . Depression Sister    Social History   Social History  . Marital status: Single    Spouse name: N/A  . Number of children: N/A  . Years of education: N/A   Social History Main Topics  . Smoking status: Never Smoker  . Smokeless tobacco: Never Used  . Alcohol use Yes     Comment: weekly  . Drug use: No  . Sexual activity: Yes   Other Topics Concern  . Not on file   Social History Narrative  . No narrative on file   Depression screen [facility] 2/9 02/14/2017 02/07/2017 02/03/2017  02/02/2017 02/01/2017  Decreased Interest 0 0 0 0 0  Down, Depressed, Hopeless 0 0 0 0 0  PHQ - 2 Score 0 0 0 0 0     Review of Systems  Constitutional: Positive for activity change, diaphoresis and fatigue. Negative for appetite change, chills, fever and unexpected weight change.  HENT: Negative for congestion, dental problem, ear pain, rhinorrhea, sinus pressure and tinnitus.   Eyes: Positive for photophobia. Negative for pain, discharge and visual disturbance.  Respiratory: Positive for cough.   Gastrointestinal: Negative for nausea and vomiting.  Genitourinary: Negative for difficulty urinating.  Musculoskeletal: Negative for neck pain and neck stiffness.  Skin: Negative for rash.  Neurological: Positive for headaches. Negative for dizziness, tremors, seizures, syncope, facial asymmetry, speech difficulty, weakness, light-headedness and numbness.  Hematological: Negative for adenopathy.  Psychiatric/Behavioral: Negative for sleep disturbance.       Objective:   Physical Exam  Constitutional: She is oriented to person, place, and time. She appears well-developed and well-nourished. No distress.  HENT:  Head: Normocephalic and atraumatic.  Right Ear: Tympanic membrane, external ear and ear canal normal.  Left Ear: Tympanic membrane, external ear and ear canal normal.  Nose: Nose normal. No mucosal edema or rhinorrhea.  Mouth/Throat: Uvula is  midline, oropharynx is clear and moist and mucous membranes are normal. No oropharyngeal exudate.  Eyes: Conjunctivae are normal. Right eye exhibits no discharge. Left eye exhibits no discharge. No scleral icterus.  Neck: Normal range of motion. Neck supple.  Cardiovascular: Regular rhythm, normal heart sounds and intact distal pulses.  Tachycardia present.   Pulmonary/Chest: Effort normal and breath sounds normal.  Abdominal: Soft. Bowel sounds are normal. She exhibits no distension and no mass. There is no tenderness. There is no rebound and  no guarding.  Lymphadenopathy:    She has no cervical adenopathy.  Neurological: She is alert and oriented to person, place, and time.  Skin: Skin is warm and dry. She is not diaphoretic. No erythema.  Psychiatric: She has a normal mood and affect. Her behavior is normal.   BP (!) 148/95   Pulse (!) 130   Temp 99 F (37.2 C) (Oral)   Resp 18   Ht 5\' 9"  (1.753 m)   Wt 277 lb (125.6 kg)   LMP 01/15/2017   SpO2 96%   BMI 40.91 kg/m     after 1L IVF BP 134/80 with Pulse 108 Assessment & Plan:   1. Gammaherpesviral mononucleosis with other complications   2. Acute hepatitis in viral disease   3. Dehydration   4. Paroxysmal supraventricular tachycardia (HCC)     Orders Placed This Encounter  Procedures  . CBC  . Comprehensive metabolic panel  . POCT urinalysis dipstick    Meds ordered this encounter  Medications  . DISCONTD: metoprolol tartrate (LOPRESSOR) 25 MG tablet    Sig: Take 1 tablet (25 mg total) by mouth every 4 (four) hours as needed (SVT, palpitations, HR >100).    Dispense:  60 tablet    Refill:  2  . metoprolol tartrate (LOPRESSOR) 25 MG tablet    Sig: Take 1 tablet (25 mg total) by mouth every 4 (four) hours as needed (SVT, palpitations, HR >100).    Dispense:  60 tablet    Refill:  2    Norberto Sorenson, M.D.  Primary Care at [facility] [address] [place], [place] [address] [phone] phone [phone] fax  03/11/17 1:30 AM  Results for orders placed or performed in visit on 02/07/17  CBC  Result Value Ref Range   WBC 9.8 3.4 - 10.8 x10E3/uL   RBC 5.02 3.77 - 5.28 x10E6/uL   Hemoglobin 14.7 11.1 - 15.9 g/dL   Hematocrit 69.6 29.5 - 46.6 %   MCV 88 79 - 97 fL   MCH 29.3 26.6 - 33.0 pg   MCHC 33.5 31.5 - 35.7 g/dL   RDW 28.4 13.2 - 44.0 %   Platelets 380 (H) 150 - 379 x10E3/uL  Comprehensive metabolic panel  Result Value Ref Range   Glucose 113 (H) 65 - 99 mg/dL   BUN 22 (H) 6 - 20 mg/dL   Creatinine, Ser 1.02 0.57 - 1.00 mg/dL     GFR calc non Af Amer 124 >59 mL/min/1.73   GFR calc Af Amer 142 >59 mL/min/1.73   BUN/Creatinine Ratio 37 (H) 9 - 23   Sodium 140 134 - 144 mmol/L   Potassium 5.3 (H) 3.5 - 5.2 mmol/L   Chloride 95 (L) 96 - 106 mmol/L   CO2 22 18 - 29 mmol/L   Calcium 9.8 8.7 - 10.2 mg/dL   Total Protein 8.1 6.0 - 8.5 g/dL   Albumin 4.3 3.5 - 5.5 g/dL   Globulin, Total 3.8 1.5 -  4.5 g/dL   Albumin/Globulin Ratio 1.1 (L) 1.2 - 2.2   Bilirubin Total 1.0 0.0 - 1.2 mg/dL   Alkaline Phosphatase 407 (H) 39 - 117 IU/L   AST 96 (H) 0 - 40 IU/L   ALT 254 (H) 0 - 32 IU/L

## 2017-02-07 ENCOUNTER — Ambulatory Visit (INDEPENDENT_AMBULATORY_CARE_PROVIDER_SITE_OTHER): Payer: 59 | Admitting: Family Medicine

## 2017-02-07 VITALS — BP 134/80 | HR 130 | Temp 99.0°F | Resp 18 | Ht 69.0 in | Wt 277.0 lb

## 2017-02-07 DIAGNOSIS — B2709 Gammaherpesviral mononucleosis with other complications: Secondary | ICD-10-CM

## 2017-02-07 DIAGNOSIS — E860 Dehydration: Secondary | ICD-10-CM

## 2017-02-07 DIAGNOSIS — I471 Supraventricular tachycardia: Secondary | ICD-10-CM | POA: Diagnosis not present

## 2017-02-07 DIAGNOSIS — B179 Acute viral hepatitis, unspecified: Secondary | ICD-10-CM | POA: Diagnosis not present

## 2017-02-07 LAB — TOXASSURE SELECT 13 (MW), URINE

## 2017-02-07 MED ORDER — METOPROLOL TARTRATE 25 MG PO TABS: 25.0000 mg | ORAL_TABLET | ORAL | 2 refills | Status: DC | PRN

## 2017-02-07 NOTE — Patient Instructions (Addendum)
   IF you received an x-ray today, you will receive an invoice from [facility]. Please contact [facility] at [phone] with questions or concerns regarding your invoice.   IF you received labwork today, you will receive an invoice from [facility]. Please contact [facility] at [phone] with questions or concerns regarding your invoice.   Our billing staff will not be able to assist you with questions regarding bills from these companies.  You will be contacted with the lab results as soon as they are available. The fastest way to get your results is to activate your My Chart account. Instructions are located on the last page of this paperwork. If you have not heard from us regarding the results in 2 weeks, please contact this office.      Rehydration, Adult Rehydration is the replacement of body fluids and salts and minerals (electrolytes) that are lost during dehydration. Dehydration is when there is not enough fluid or water in the body. This happens when you lose more fluids than you take in. Common causes of dehydration include:  Vomiting.  Diarrhea.  Excessive sweating, such as from heat exposure or exercise.  Taking medicines that cause the body to lose excess fluid (diuretics).  Impaired kidney function.  Not drinking enough fluid.  Certain illnesses or infections.  Certain poorly controlled long-term (chronic) illnesses, such as diabetes, heart disease, and kidney disease. Symptoms of mild dehydration may include thirst, dry lips and mouth, dry skin, and dizziness. Symptoms of severe dehydration may include increased heart rate, confusion, fainting, and not urinating. You can rehydrate by drinking certain fluids or getting fluids through an IV tube, as told by your health care provider. What are the risks? Generally, rehydration is safe. However, one problem that can happen is taking in too much fluid (overhydration). This is rare. If overhydration  happens, it can cause an electrolyte imbalance, kidney failure, or a decrease in salt (sodium) levels in the body. How to rehydrate Follow instructions from your health care provider for rehydration. The kind of fluid you should drink and the amount you should drink depend on your condition.  If directed by your health care provider, drink an oral rehydration solution (ORS). This is a drink designed to treat dehydration that is found in pharmacies and retail stores.  Make an ORS by following instructions on the package.  Start by drinking small amounts, about  cup (120 mL) every 5-10 minutes.  Slowly increase how much you drink until you have taken the amount recommended by your health care provider.  Drink enough clear fluids to keep your urine clear or pale yellow. If you were instructed to drink an ORS, finish the ORS first, then start slowly drinking other clear fluids. Drink fluids such as:  Water. Do not drink only water. Doing that can lead to having too little sodium in your body (hyponatremia).  Ice chips.  Fruit juice that you have added water to (diluted juice).  Low-calorie sports drinks.  If you are severely dehydrated, your health care provider may recommend that you receive fluids through an IV tube in the hospital.  Do not take sodium tablets. Doing that can lead to the condition of having too much sodium in your body (hypernatremia). Eating while you rehydrate Follow instructions from your health care provider about what to eat while you rehydrate. Your health care provider may recommend that you slowly begin eating regular foods in small amounts.  Eat foods that contain a healthy balance of electrolytes,   as bananas, oranges, potatoes, tomatoes, and spinach.  Avoid foods that are greasy or contain a lot of fat or sugar. In some cases, you may get nutrition through a feeding tube that is passed through your nose and into your stomach (nasogastric tube, or NG  tube). This may be done if you have uncontrolled vomiting or diarrhea. Beverages to avoid Certain beverages may make dehydration worse. While you rehydrate, avoid:  Alcohol.  Caffeine.  Drinks that contain a lot of sugar. These include:  High-calorie sports drinks.  Fruit juice that is not diluted.  Soda. Check nutrition labels to see how much sugar or caffeine a beverage contains. Signs of dehydration recovery You may be recovering from dehydration if:  You are urinating more often than before you started rehydrating.  Your urine is clear or pale yellow.  Your energy level improves.  You vomit less frequently.  You have diarrhea less frequently.  Your appetite improves or returns to normal.  You feel less dizzy or less light-headed.  Your skin tone and color start to look more normal. Contact a health care provider if:  You continue to have symptoms of mild dehydration, such as:  Thirst.  Dry lips.  Slightly dry mouth.  Dry, warm skin.  Dizziness.  You continue to vomit or have diarrhea. Get help right away if:  You have symptoms of dehydration that get worse.  You feel:  Confused.  Weak.  Like you are going to faint.  You have not urinated in 6-8 hours.  You have very dark urine.  You have trouble breathing.  Your heart rate while sitting still is over 100 beats a minute.  You cannot drink fluids without vomiting.  You have vomiting or diarrhea that:  Gets worse.  Does not go away.  You have a fever. This information is not intended to replace advice given to you by your health care provider. Make sure you discuss any questions you have with your health care provider. Document Released: 02/21/2012 Document Revised: 06/18/2016 Document Reviewed: 01/23/2016 Elsevier Interactive Patient Education  2017 [employer]

## 2017-02-08 ENCOUNTER — Telehealth: Payer: Self-pay | Admitting: Family Medicine

## 2017-02-08 LAB — COMPREHENSIVE METABOLIC PANEL
ALK PHOS: 407 IU/L — AB (ref 39–117)
ALT: 254 IU/L — ABNORMAL HIGH (ref 0–32)
AST: 96 IU/L — AB (ref 0–40)
Albumin/Globulin Ratio: 1.1 — ABNORMAL LOW (ref 1.2–2.2)
Albumin: 4.3 g/dL (ref 3.5–5.5)
BILIRUBIN TOTAL: 1 mg/dL (ref 0.0–1.2)
BUN/Creatinine Ratio: 37 — ABNORMAL HIGH (ref 9–23)
BUN: 22 mg/dL — AB (ref 6–20)
CHLORIDE: 95 mmol/L — AB (ref 96–106)
CO2: 22 mmol/L (ref 18–29)
Calcium: 9.8 mg/dL (ref 8.7–10.2)
Creatinine, Ser: 0.6 mg/dL (ref 0.57–1.00)
GFR calc Af Amer: 142 mL/min/{1.73_m2} (ref >59)
GFR calc non Af Amer: 124 mL/min/{1.73_m2} (ref >59)
GLUCOSE: 113 mg/dL — AB (ref 65–99)
Globulin, Total: 3.8 g/dL (ref 1.5–4.5)
Potassium: 5.3 mmol/L — ABNORMAL HIGH (ref 3.5–5.2)
Sodium: 140 mmol/L (ref 134–144)
Total Protein: 8.1 g/dL (ref 6.0–8.5)

## 2017-02-08 LAB — CBC
Hematocrit: 43.9 % (ref 34.0–46.6)
Hemoglobin: 14.7 g/dL (ref 11.1–15.9)
MCH: 29.3 pg (ref 26.6–33.0)
MCHC: 33.5 g/dL (ref 31.5–35.7)
MCV: 88 fL (ref 79–97)
PLATELETS: 380 10*3/uL — AB (ref 150–379)
RBC: 5.02 x10E6/uL (ref 3.77–5.28)
RDW: 14 % (ref 12.3–15.4)
WBC: 9.8 10*3/uL (ref 3.4–10.8)

## 2017-02-08 MED ORDER — METOPROLOL TARTRATE 25 MG PO TABS: 25.0000 mg | ORAL_TABLET | ORAL | 2 refills | Status: DC | PRN

## 2017-02-08 NOTE — Telephone Encounter (Signed)
Pt would like her lab results too See Abnormal results

## 2017-02-08 NOTE — Telephone Encounter (Signed)
Called pt to discuss.

## 2017-02-08 NOTE — Telephone Encounter (Signed)
Please call pt and let her know that I accidentally sent her metoprolol to [facility] yesterday. I wanted her to have it now - not in 2 wks - and so have now resent [facility] [address]. However, sometimes [facility] will [facility] them immediately and then her insurance won't cover a local rx as it would be a duplicate. I have sent a fax to [facility] asking them to cancel the metoprolol rx as needs to get locally but we may need to call them at [phone] Order # [id number] to clarify.  Thanks and sorry.

## 2017-02-08 NOTE — Telephone Encounter (Signed)
Pt advised and will get local rx or call if not able

## 2017-02-10 DIAGNOSIS — B2790 Infectious mononucleosis, unspecified without complication: Secondary | ICD-10-CM

## 2017-02-10 HISTORY — DX: Infectious mononucleosis, unspecified without complication: B27.90

## 2017-02-14 ENCOUNTER — Ambulatory Visit: Payer: 59 | Admitting: Family Medicine

## 2017-02-14 ENCOUNTER — Encounter: Payer: Self-pay | Admitting: Family Medicine

## 2017-02-14 VITALS — BP 138/88 | HR 123 | Temp 98.9°F | Resp 18 | Ht 69.0 in | Wt 277.0 lb

## 2017-02-14 DIAGNOSIS — Z113 Encounter for screening for infections with a predominantly sexual mode of transmission: Secondary | ICD-10-CM

## 2017-02-14 DIAGNOSIS — B179 Acute viral hepatitis, unspecified: Secondary | ICD-10-CM

## 2017-02-14 DIAGNOSIS — E860 Dehydration: Secondary | ICD-10-CM | POA: Diagnosis not present

## 2017-02-14 DIAGNOSIS — B2709 Gammaherpesviral mononucleosis with other complications: Secondary | ICD-10-CM | POA: Diagnosis not present

## 2017-02-14 DIAGNOSIS — I471 Supraventricular tachycardia: Secondary | ICD-10-CM | POA: Diagnosis not present

## 2017-02-14 MED ORDER — PROMETHAZINE HCL 25 MG PO TABS: 25.0000 mg | ORAL_TABLET | Freq: Three times a day (TID) | ORAL | 0 refills | Status: DC | PRN

## 2017-02-14 NOTE — Patient Instructions (Signed)
     IF you received an x-ray today, you will receive an invoice from [facility]. Please contact [facility] at [phone] with questions or concerns regarding your invoice.   IF you received labwork today, you will receive an invoice from [facility]. Please contact [facility] at [phone] with questions or concerns regarding your invoice.   Our billing staff will not be able to assist you with questions regarding bills from these companies.  You will be contacted with the lab results as soon as they are available. The fastest way to get your results is to activate your My Chart account. Instructions are located on the last page of this paperwork. If you have not heard from us regarding the results in 2 weeks, please contact this office.     

## 2017-02-14 NOTE — Progress Notes (Signed)
Subjective:    Patient ID: Rose SimsMarie Cortez, female    DOB: June 09, 1986, 31 y.o.   MRN: [id number] Chief Complaint  Patient presents with  . Follow-up    HPI  Still waking up with sweats. Does gave some periods where She still feels quite ill but much further between.  Has been taking the metoprolol twice a day for the SVT and she can tell a difference.  Even just walking up the steps will trigger it.   Will get a few beats if it occurs - has not had when she is at work fortunately.  Sudden movements but triggered but relieve by squatting.   She forgot the citalopram for sev d but back ot taking it every morning. Using the cough syrup at night to suppress and sleeping well. Not wakes up with the headaches anymore Appetite good, urine and bowels normal. Taking the promethazine at night right before bed.   Using tramadol prn - does not make her feel weird.    Past Medical History:  Diagnosis Date  . Anxiety   . Depression   . Hyperlipidemia    History reviewed. No pertinent surgical history. Current Outpatient Prescriptions on File Prior to Visit  Medication Sig Dispense Refill  . chlorpheniramine-HYDROcodone (TUSSIONEX PENNKINETIC ER) 10-8 MG/5ML SUER Take 5 mLs by mouth every 12 (twelve) hours as needed. 120 mL 0  . cholecalciferol (VITAMIN D) 1000 UNITS tablet Take 5,000 Units by mouth daily.    . citalopram (CELEXA) 20 MG tablet TAKE 1 TABLET BY MOUTH  DAILY 90 tablet 0  . Fish Oil OIL by Does not apply route.    . Melatonin 5 MG TABS Take by mouth. Reported on 05/27/2016    . metoprolol tartrate (LOPRESSOR) 25 MG tablet Take 1 tablet (25 mg total) by mouth every 4 (four) hours as needed (SVT, palpitations, HR >100). 60 tablet 2  . nystatin (MYCOSTATIN/NYSTOP) 100000 UNIT/GM POWD Apply 5 g topically 3 (three) times daily. 60 g 2  . simvastatin (ZOCOR) 40 MG tablet TAKE 1 TABLET BY MOUTH  DAILY 90 tablet 0  . traMADol (ULTRAM) 50 MG tablet Take 1-2 tablets (50-100 mg total) by mouth  every 6 (six) hours as needed. 40 tablet 0   No current facility-administered medications on file prior to visit.    No Known Allergies Family History  Problem Relation Age of Onset  . Hyperlipidemia Mother   . Hyperlipidemia Father   . Depression Sister    Social History   Social History  . Marital status: Single    Spouse name: N/A  . Number of children: N/A  . Years of education: N/A   Social History Main Topics  . Smoking status: Never Smoker  . Smokeless tobacco: Never Used  . Alcohol use Yes     Comment: weekly  . Drug use: No  . Sexual activity: Yes   Other Topics Concern  . None   Social History Narrative  . None   [facility] 02/14/2017 02/07/2017 02/03/2017 02/02/2017 02/01/2017  Decreased Interest 0 0 0 0 0  Down, Depressed, Hopeless 0 0 0 0 0  PHQ - 2 Score 0 0 0 0 0    Review of Systems  Constitutional: Positive for activity change, appetite change, diaphoresis and fatigue. Negative for chills, fever and unexpected weight change.  HENT: Negative for congestion, sore throat, trouble swallowing and voice change.   Eyes: Negative for redness.  Respiratory: Positive for cough. Negative for shortness of  breath.   Cardiovascular: Positive for palpitations and leg swelling. Negative for chest pain.  Gastrointestinal: Positive for nausea. Negative for abdominal distention, abdominal pain, constipation, diarrhea and vomiting.  Genitourinary: Negative for decreased urine volume, difficulty urinating and hematuria.  Skin: Negative for color change.  Neurological: Positive for weakness and headaches. Negative for dizziness and light-headedness.  Hematological: Negative for adenopathy.  Psychiatric/Behavioral: Positive for sleep disturbance. The patient is not nervous/anxious.    See hpi    Objective:   Physical Exam  Constitutional: She is oriented to person, place, and time. She appears well-developed and well-nourished. No distress.  HENT:  Head:  Normocephalic and atraumatic.  Right Ear: Tympanic membrane, external ear and ear canal normal.  Left Ear: Tympanic membrane, external ear and ear canal normal.  Nose: Nose normal. No mucosal edema or rhinorrhea.  Mouth/Throat: Uvula is midline, oropharynx is clear and moist and mucous membranes are normal. No oropharyngeal exudate.  Eyes: Conjunctivae are normal. Right eye exhibits no discharge. Left eye exhibits no discharge. No scleral icterus.  Neck: Normal range of motion. Neck supple.  Cardiovascular: Normal rate, regular rhythm, normal heart sounds and intact distal pulses.   Pulmonary/Chest: Effort normal and breath sounds normal.  Abdominal: Soft. Bowel sounds are normal. She exhibits no distension and no mass. There is no tenderness. There is no rebound and no guarding.  Lymphadenopathy:    She has no cervical adenopathy.  Neurological: She is alert and oriented to person, place, and time.  Skin: Skin is warm and dry. She is not diaphoretic. No erythema.  Psychiatric: She has a normal mood and affect. Her behavior is normal.      BP 138/88   Pulse (!) 123   Temp 98.9 F (37.2 C) (Oral)   Resp 18   Ht 5\' 9"  (1.753 m)   Wt 277 lb (125.6 kg)   LMP 01/15/2017   SpO2 96%   BMI 40.91 kg/m      Assessment & Plan:   1. Gammaherpesviral mononucleosis with other complications - sxs resolved. Ok to release back to work  2. Acute hepatitis in viral disease - resolving, cont low fat diet and no EtOH x 1 more mo, then recheck to ensure completely back to baseline  3. Dehydration   4. Screening for STD (sexually transmitted disease)   5. Paroxysmal SVT (supraventricular tachycardia) (HCC) - pt has prn BB or does vagal maneuvers/squats to break    Orders Placed This Encounter  Procedures  . GC/Chlamydia Probe Amp    Standing Status:   Future    Standing Expiration Date:   02/14/2018  . CBC with Differential/Platelet  . Comprehensive metabolic panel  . Hepatic function panel     Standing Status:   Future    Standing Expiration Date:   04/16/2017    Meds ordered this encounter  Medications  . promethazine (PHENERGAN) 25 MG tablet    Sig: Take 1 tablet (25 mg total) by mouth every 8 (eight) hours as needed for nausea or vomiting.    Dispense:  20 tablet    Refill:  0      Norberto Sorenson, M.D.  Primary Care at [facility] [address] [place], [place] [address] [phone] phone [phone] fax  03/11/17 10:54 AM

## 2017-02-15 LAB — CBC WITH DIFFERENTIAL/PLATELET
BASOS ABS: 0.1 10*3/uL (ref 0.0–0.2)
Basos: 1 %
EOS (ABSOLUTE): 0 10*3/uL (ref 0.0–0.4)
Eos: 1 %
HEMOGLOBIN: 13.6 g/dL (ref 11.1–15.9)
Hematocrit: 39.7 % (ref 34.0–46.6)
Immature Grans (Abs): 0 10*3/uL (ref 0.0–0.1)
Immature Granulocytes: 0 %
LYMPHS ABS: 3.7 10*3/uL — AB (ref 0.7–3.1)
Lymphs: 50 %
MCH: 29.3 pg (ref 26.6–33.0)
MCHC: 34.3 g/dL (ref 31.5–35.7)
MCV: 86 fL (ref 79–97)
MONOCYTES: 10 %
Monocytes Absolute: 0.8 10*3/uL (ref 0.1–0.9)
NEUTROS ABS: 2.8 10*3/uL (ref 1.4–7.0)
Neutrophils: 38 %
Platelets: 476 10*3/uL — ABNORMAL HIGH (ref 150–379)
RBC: 4.64 x10E6/uL (ref 3.77–5.28)
RDW: 14 % (ref 12.3–15.4)
WBC: 7.4 10*3/uL (ref 3.4–10.8)

## 2017-02-15 LAB — COMPREHENSIVE METABOLIC PANEL
A/G RATIO: 1.3 (ref 1.2–2.2)
ALBUMIN: 4.2 g/dL (ref 3.5–5.5)
ALT: 110 IU/L — ABNORMAL HIGH (ref 0–32)
AST: 48 IU/L — ABNORMAL HIGH (ref 0–40)
Alkaline Phosphatase: 182 IU/L — ABNORMAL HIGH (ref 39–117)
BILIRUBIN TOTAL: 0.6 mg/dL (ref 0.0–1.2)
BUN / CREAT RATIO: 37 — AB (ref 9–23)
BUN: 19 mg/dL (ref 6–20)
CHLORIDE: 96 mmol/L (ref 96–106)
CO2: 27 mmol/L (ref 18–29)
Calcium: 9.7 mg/dL (ref 8.7–10.2)
Creatinine, Ser: 0.51 mg/dL — ABNORMAL LOW (ref 0.57–1.00)
GFR calc Af Amer: 150 mL/min/{1.73_m2} (ref >59)
GFR calc non Af Amer: 130 mL/min/{1.73_m2} (ref >59)
GLOBULIN, TOTAL: 3.2 g/dL (ref 1.5–4.5)
Glucose: 94 mg/dL (ref 65–99)
POTASSIUM: 4.9 mmol/L (ref 3.5–5.2)
SODIUM: 139 mmol/L (ref 134–144)
Total Protein: 7.4 g/dL (ref 6.0–8.5)

## 2017-02-21 ENCOUNTER — Ambulatory Visit: Payer: 59 | Admitting: Cardiology

## 2017-03-13 HISTORY — PX: TRANSTHORACIC ECHOCARDIOGRAM: SHX275

## 2017-03-15 ENCOUNTER — Ambulatory Visit: Payer: 59 | Admitting: Gastroenterology

## 2017-03-18 ENCOUNTER — Ambulatory Visit (INDEPENDENT_AMBULATORY_CARE_PROVIDER_SITE_OTHER): Payer: 59 | Admitting: Cardiology

## 2017-03-18 ENCOUNTER — Encounter: Payer: Self-pay | Admitting: Cardiology

## 2017-03-18 DIAGNOSIS — E6609 Other obesity due to excess calories: Secondary | ICD-10-CM

## 2017-03-18 DIAGNOSIS — I471 Supraventricular tachycardia: Secondary | ICD-10-CM | POA: Diagnosis not present

## 2017-03-18 DIAGNOSIS — R0609 Other forms of dyspnea: Secondary | ICD-10-CM | POA: Diagnosis not present

## 2017-03-18 MED ORDER — METOPROLOL SUCCINATE ER 25 MG PO TB24: 25.0000 mg | ORAL_TABLET | Freq: Every day | ORAL | 3 refills | Status: DC

## 2017-03-18 NOTE — Patient Instructions (Signed)
Schedule at [address] Your physician has requested that you have an echocardiogram. Echocardiography is a painless test that uses sound waves to create images of your heart. It provides your doctor with information about the size and shape of your heart and how well your heart's chambers and valves are working. This procedure takes approximately one hour. There are no restrictions for this procedure.    Medication START METOPROLOL XL ( SUCCINATE) 25 MG TAKE AT BEDTIME. USE METOPROLOL TARTRATE IF NEEDED FOR FAST HEARTRATE ONLY   Your physician recommends that you schedule a follow-up appointment in 3-4 MONTHS WITH Dr Herbie Baltimore

## 2017-03-18 NOTE — Progress Notes (Signed)
PCP: Pcp Not In System  Clinic Note: Chief Complaint  Patient presents with  . New Evaluation    Referred by Dr. Norberto Cortez for evaluation of PSVT---pt states she would like a second opinion, no Sx.    HPI: Rose Cortez is a 31 y.o. female with a PMH below who presents today for establishing Cardiology care - has h/o of long-standing PSVT - diagnosed as a pre-teen.  Recent Hospitalizations: n/a  Studies Reviewed: n/a  Interval History: [name] presents today to discuss her prior history of SVT in the fact she is now had more frequent episodes. She says that she is able to break the episodes multiple time with squatting maneuvers, stating that other vagal maneuvers have not worked. Mostly times able to control these episodes, but has been noticing that the episodes are lasting longer and more frequent. She had been on propranolol which worked quite well, but had issues with insurance company and therefore has been using when necessary metoprolol. Usually metoprolol helps, but it seems to be that she has rebound events after the medication wears off. She has not had any episodes of syncope or near syncope. She does little dizzy and lightheaded but has never passed out. The episodes oftentimes occur when she is trying to exercise which is really started bothering her because she is trying to exercise and lose weight -- in doing so, she does note some exertional SOB. Now she may have as many as 2-3 episodes a day, but then can go several weeks without having an episode.  Episodes are associated with low discomfort and dyspnea, but otherwise she has no resting or exertional chest tightness or pressure. She has noted some exertional dyspnea.  No PND, orthopnea or edema.  No syncope/near syncope. No TIA/amaurosis fugax symptoms.  ROS: A comprehensive was performed. Review of Systems  Constitutional: Negative for malaise/fatigue.       She has actually gained weight  HENT: Negative for nosebleeds.    Gastrointestinal: Negative for blood in stool.  Genitourinary: Negative for hematuria.  Musculoskeletal: Negative for joint pain.  Neurological: Positive for dizziness (With her episodes of SVT).  Psychiatric/Behavioral: Negative for depression. The patient is nervous/anxious.   All other systems reviewed and are negative.   Past Medical History:  Diagnosis Date  . Anxiety   . Depression   . Hyperlipidemia     History reviewed. No pertinent surgical history.  Current Meds  Medication Sig  . cholecalciferol (VITAMIN D) 1000 UNITS tablet Take 5,000 Units by mouth daily.  . citalopram (CELEXA) 20 MG tablet TAKE 1 TABLET BY MOUTH  DAILY  . Fish Oil OIL by Does not apply route.  . metoprolol tartrate (LOPRESSOR) 25 MG tablet Take 1 tablet (25 mg total) by mouth every 4 (four) hours as needed (SVT, palpitations, HR >100).  . nystatin (MYCOSTATIN/NYSTOP) 100000 UNIT/GM POWD Apply 5 g topically 3 (three) times daily.  . simvastatin (ZOCOR) 40 MG tablet TAKE 1 TABLET BY MOUTH  DAILY    No Known Allergies  Social History   Social History  . Marital status: Single    Spouse name: N/A  . Number of children: N/A  . Years of education: N/A   Social History Main Topics  . Smoking status: Never Smoker  . Smokeless tobacco: Never Used  . Alcohol use Yes     Comment: weekly  . Drug use: No  . Sexual activity: Yes   Other Topics Concern  . None   Social History  Narrative  . None    family history includes Depression in her sister; Hyperlipidemia in her father and mother.  Wt Readings from Last 3 Encounters:  03/18/17 283 lb (128.4 kg)  02/14/17 277 lb (125.6 kg)  02/07/17 277 lb (125.6 kg)    PHYSICAL EXAM BP (!) 150/104   Pulse (!) 115   Ht  (1.753 m)   Wt 283 lb (128.4 kg)   BMI 41.79 kg/m  General appearance: alert, cooperative, appears stated age, no distress and ~morbidly obese HEENT: [place]/AT, EOMI, MMM, anicteric sclera Neck: no adenopathy, no carotid  bruit and no JVD Lungs: clear to auscultation bilaterally, normal percussion bilaterally and non-labored Heart: regular rate and rhythm, S1& S2 normal, no murmur, click, rub or gallop; non-displaced PMI Abdomen: soft, non-tender; bowel sounds normal; no masses,  no organomegaly; no HJR Extremities: extremities normal, atraumatic, no cyanosis, or edema  Pulses: 2+ and symmetric;  Skin: mobility and turgor normal, no edema, no evidence of bleeding or bruising and no lesions noted  Neurologic: Mental status: Alert, oriented, thought content appropriate Cranial nerves: normal (II-XII grossly intact)    Adult ECG Report  Rate: 115 ;  Rhythm: sinus tachycardia and Non-specific ST-T changes. Otherwise normal axis, intervals & durations.;   Narrative Interpretation: Otherwise normal EKG.   Other studies Reviewed: Additional studies/ records that were reviewed today include:  Recent Labs:   Lab Results  Component Value Date   CREATININE 0.51 (L) 02/14/2017   BUN 19 02/14/2017   NA 139 02/14/2017   K 4.9 02/14/2017   CL 96 02/14/2017   CO2 27 02/14/2017    ASSESSMENT / PLAN: Problem List Items Addressed This Visit    DOE (dyspnea on exertion)    Probably related to deconditioning and obesity. But since she has triggered tachycardia with exertion and this is associated with dyspnea, I would like to exclude any structural abnormalities. Plan: Check 2-D echocardiogram.      Relevant Orders   EKG 12-Lead   ECHOCARDIOGRAM COMPLETE   Obesity    She needs to exercise. Hopefully by controlling her blood pressure and heart rate with some beta blocker standing dose, she will be able to get back into an exercise program and lose weight.      PSVT (paroxysmal supraventricular tachycardia) (HCC) (Chronic)    This is a long-standing condition associated also with having baseline sinus tachycardia. I think she may benefit from being on a standing beta blocker as opposed to simply when necessary  dosing. Long-acting beta blocker would be the best option and therefore we'll start with Toprol 25 mg. She can then also use the when necessary beta blocker but this will avoid having the rebound effect.  Adequate hydration. If symptoms do persist and she is starting to get really asymptomatic, we can refer to EP to discuss the possibility of ablation.      Relevant Medications   metoprolol succinate (TOPROL XL) 25 MG 24 hr tablet   Other Relevant Orders   EKG 12-Lead   ECHOCARDIOGRAM COMPLETE      Current medicines are reviewed at length with the patient today. (+/- concerns) ? BB as standing medication The following changes have been made: see below.  Patient Instructions  Schedule at [address] Your physician has requested that you have an echocardiogram. Echocardiography is a painless test that uses sound waves to create images of your heart. It provides your doctor with information about the size and shape of  your heart and how well your heart's chambers and valves are working. This procedure takes approximately one hour. There are no restrictions for this procedure.    Medication START METOPROLOL XL ( SUCCINATE) 25 MG TAKE AT BEDTIME. USE METOPROLOL TARTRATE IF NEEDED FOR FAST HEARTRATE ONLY   Your physician recommends that you schedule a follow-up appointment in 3-4 MONTHS WITH Dr Herbie Baltimore    Studies Ordered:   Orders Placed This Encounter  Procedures  . EKG 12-Lead  . ECHOCARDIOGRAM COMPLETE      Bryan Lemma, M.D., M.S. Interventional Cardiologist   Pager # [phone] Phone # [phone] [address] [place], [place] [address]

## 2017-03-20 NOTE — Assessment & Plan Note (Signed)
She needs to exercise. Hopefully by controlling her blood pressure and heart rate with some beta blocker standing dose, she will be able to get back into an exercise program and lose weight.

## 2017-03-20 NOTE — Assessment & Plan Note (Signed)
This is a long-standing condition associated also with having baseline sinus tachycardia. I think she may benefit from being on a standing beta blocker as opposed to simply when necessary dosing. Long-acting beta blocker would be the best option and therefore we'll start with Toprol 25 mg. She can then also use the when necessary beta blocker but this will avoid having the rebound effect.  Adequate hydration. If symptoms do persist and she is starting to get really asymptomatic, we can refer to EP to discuss the possibility of ablation.

## 2017-03-20 NOTE — Assessment & Plan Note (Signed)
Probably related to deconditioning and obesity. But since she has triggered tachycardia with exertion and this is associated with dyspnea, I would like to exclude any structural abnormalities. Plan: Check 2-D echocardiogram.

## 2017-03-24 ENCOUNTER — Ambulatory Visit (INDEPENDENT_AMBULATORY_CARE_PROVIDER_SITE_OTHER): Payer: 59 | Admitting: Family Medicine

## 2017-03-24 ENCOUNTER — Encounter: Payer: Self-pay | Admitting: Family Medicine

## 2017-03-24 VITALS — BP 153/94 | HR 113 | Temp 98.2°F | Resp 18 | Ht 69.0 in | Wt 287.0 lb

## 2017-03-24 DIAGNOSIS — B179 Acute viral hepatitis, unspecified: Secondary | ICD-10-CM

## 2017-03-24 DIAGNOSIS — B2709 Gammaherpesviral mononucleosis with other complications: Secondary | ICD-10-CM

## 2017-03-24 DIAGNOSIS — R635 Abnormal weight gain: Secondary | ICD-10-CM | POA: Diagnosis not present

## 2017-03-24 LAB — POCT URINALYSIS DIP (MANUAL ENTRY)
BILIRUBIN UA: NEGATIVE mg/dL
Bilirubin, UA: NEGATIVE
Glucose, UA: NEGATIVE mg/dL
Leukocytes, UA: NEGATIVE
Nitrite, UA: NEGATIVE
PROTEIN UA: NEGATIVE mg/dL
RBC UA: NEGATIVE
SPEC GRAV UA: 1.025 (ref 1.010–1.025)
UROBILINOGEN UA: 0.2 U/dL
pH, UA: 5.5 (ref 5.0–8.0)

## 2017-03-24 MED ORDER — LISDEXAMFETAMINE DIMESYLATE 40 MG PO CAPS: 40.0000 mg | ORAL_CAPSULE | ORAL | 0 refills | Status: DC

## 2017-03-24 NOTE — Progress Notes (Signed)
Subjective:    Patient ID: Rose Cortez, female    DOB: March 03, 1986, 31 y.o.   MRN: [id number] Chief Complaint  Patient presents with  . Follow-up    1 month     HPI Rose Cortez is frustrated by her continued weight gain. She has an apple for breakfast, 2 poached eggs, whole weat toast with pb and banana slices For lunch she will do a protein bar or a [place] sandwich, crackers. Dinner is often ground [place] or chicken in crock pot. Does try to do some left overs. Not sure she is watching portion size as much as she should.  Wonders if attention, motivation, focus. Brain chemistry. Eats and feels hungry pretty soon. Cottage cheese, cheese slices, crackers. Does eat fried foods made out like fries and chicken fingers about 3x/wk - has been cutting down. Has been trying to cut down on sweets to once a week - watching herself in the grocery store. Not getting enough exercise - knows she is not as active as she should be.   Ince Atkins, [facility] - she lost weight on them but hard to [employer].  Her apt complex has a little gym which she can do. Has started  SEv yrs ago she was in a good place with zumba, yoga, pilates but notes difficulty starting these back up again.  Phenteramine helped for a while but then stopped working.  SVT often keeps her from exercise as it triggers an episode and then she has to stop to slow down her HR.  Notes zones out and gets easily distracted even when exercising.  When we started her on adderall  bid she noticed a little burst for a few minutes. No effect with wellbutrin. Phenteramine worked for 6 mos. Propranolol long term did not help  Still on citalopram  Does not have stuff at home to check it.   A few years ago she got down to 172 lbs with zumba and phenteramine at 31 yo.  sev yrs periods irreg and spreadin gout so skipping some mos No HAs.  Depression screen [facility] 2/9 03/24/2017 02/14/2017 02/07/2017 02/03/2017 02/02/2017  Decreased Interest 0 0 0 0 0    Down, Depressed, Hopeless 0 0 0 0 0  PHQ - 2 Score 0 0 0 0 0   Past Medical History:  Diagnosis Date  . Anxiety   . Depression   . Hyperlipidemia    History reviewed. No pertinent surgical history. Current Outpatient Prescriptions on File Prior to Visit  Medication Sig Dispense Refill  . cholecalciferol (VITAMIN D) 1000 UNITS tablet Take 5,000 Units by mouth daily.    . citalopram (CELEXA) 20 MG tablet TAKE 1 TABLET BY MOUTH  DAILY 90 tablet 0  . Fish Oil OIL by Does not apply route.    . metoprolol succinate (TOPROL XL) 25 MG 24 hr tablet Take 1 tablet (25 mg total) by mouth at bedtime. 90 tablet 3  . metoprolol tartrate (LOPRESSOR) 25 MG tablet Take 1 tablet (25 mg total) by mouth every 4 (four) hours as needed (SVT, palpitations, HR >100). 60 tablet 2  . nystatin (MYCOSTATIN/NYSTOP) 100000 UNIT/GM POWD Apply 5 g topically 3 (three) times daily. 60 g 2  . simvastatin (ZOCOR) 40 MG tablet TAKE 1 TABLET BY MOUTH  DAILY (Patient not taking: Reported on 03/24/2017) 90 tablet 0   No current facility-administered medications on file prior to visit.    No Known Allergies Family History  Problem Relation Age of Onset  .  Hyperlipidemia Mother   . Hyperlipidemia Father   . Depression Sister    Social History   Social History  . Marital status: Single    Spouse name: N/A  . Number of children: N/A  . Years of education: N/A   Social History Main Topics  . Smoking status: Never Smoker  . Smokeless tobacco: Never Used  . Alcohol use Yes     Comment: weekly  . Drug use: No  . Sexual activity: Yes   Other Topics Concern  . None   Social History Narrative  . None    Review of Systems See hpi    Objective:   Physical Exam  Constitutional: She is oriented to person, place, and time. She appears well-developed and well-nourished. No distress.  HENT:  Head: Normocephalic and atraumatic.  Right Ear: External ear normal.  Left Ear: External ear normal.  Eyes: Conjunctivae  are normal. No scleral icterus.  Neck: Normal range of motion. Neck supple. No thyromegaly present.  Cardiovascular: Normal rate, regular rhythm, normal heart sounds and intact distal pulses.   Pulmonary/Chest: Effort normal and breath sounds normal. No respiratory distress.  Musculoskeletal: She exhibits no edema.  Lymphadenopathy:    She has no cervical adenopathy.  Neurological: She is alert and oriented to person, place, and time.  Skin: Skin is warm and dry. She is not diaphoretic. No erythema.  Psychiatric: She has a normal mood and affect. Her behavior is normal.         BP (!) 153/94   Pulse (!) 113   Temp 98.2 F (36.8 C) (Oral)   Resp 18   Ht  (1.753 m)   Wt 287 lb (130.2 kg)   LMP 03/01/2017   SpO2 97%   BMI 42.38 kg/m   Assessment & Plan:   1. Gammaherpesviral mononucleosis with other complications   2. Acute hepatitis in viral disease   3. Weight gain    Reviewed diet and exercise in detail. Encouraged pt to come up with a plan she can do for 3 wks that she is excited about and try vyvanse for binge eating d/o to see if helps.  Orders Placed This Encounter  Procedures  . Comprehensive metabolic panel  . CBC with Differential/Platelet  . Hemoglobin A1c  . POCT urinalysis dipstick    Meds ordered this encounter  Medications  . lisdexamfetamine (VYVANSE) 40 MG capsule    Sig: Take 1 capsule (40 mg total) by mouth every morning.    Dispense:  30 capsule    Refill:  0    Over 40 min spent in face-to-face evaluation of and consultation with patient and coordination of care.  Over 50% of this time was spent counseling this patient.   Norberto Sorenson, M.D.  Primary Care at [facility] [address] [place], [place] [address] [phone] phone [phone] fax  03/26/17 11:27 PM

## 2017-03-24 NOTE — Patient Instructions (Signed)
     IF you received an x-ray today, you will receive an invoice from [facility]. Please contact [facility] at [phone] with questions or concerns regarding your invoice.   IF you received labwork today, you will receive an invoice from [facility]. Please contact [facility] at [phone] with questions or concerns regarding your invoice.   Our billing staff will not be able to assist you with questions regarding bills from these companies.  You will be contacted with the lab results as soon as they are available. The fastest way to get your results is to activate your My Chart account. Instructions are located on the last page of this paperwork. If you have not heard from us regarding the results in 2 weeks, please contact this office.     

## 2017-03-25 LAB — CBC WITH DIFFERENTIAL/PLATELET
BASOS ABS: 0 10*3/uL (ref 0.0–0.2)
Basos: 0 %
EOS (ABSOLUTE): 0.2 10*3/uL (ref 0.0–0.4)
Eos: 2 %
HEMOGLOBIN: 14.4 g/dL (ref 11.1–15.9)
Hematocrit: 41.8 % (ref 34.0–46.6)
Immature Grans (Abs): 0 10*3/uL (ref 0.0–0.1)
Immature Granulocytes: 0 %
LYMPHS ABS: 2.9 10*3/uL (ref 0.7–3.1)
Lymphs: 41 %
MCH: 28.8 pg (ref 26.6–33.0)
MCHC: 34.4 g/dL (ref 31.5–35.7)
MCV: 84 fL (ref 79–97)
MONOCYTES: 8 %
MONOS ABS: 0.6 10*3/uL (ref 0.1–0.9)
NEUTROS ABS: 3.4 10*3/uL (ref 1.4–7.0)
Neutrophils: 49 %
Platelets: 397 10*3/uL — ABNORMAL HIGH (ref 150–379)
RBC: 5 x10E6/uL (ref 3.77–5.28)
RDW: 13.6 % (ref 12.3–15.4)
WBC: 7 10*3/uL (ref 3.4–10.8)

## 2017-03-25 LAB — COMPREHENSIVE METABOLIC PANEL
A/G RATIO: 1.5 (ref 1.2–2.2)
ALK PHOS: 73 IU/L (ref 39–117)
ALT: 63 IU/L — ABNORMAL HIGH (ref 0–32)
AST: 36 IU/L (ref 0–40)
Albumin: 4.4 g/dL (ref 3.5–5.5)
BILIRUBIN TOTAL: 0.2 mg/dL (ref 0.0–1.2)
BUN/Creatinine Ratio: 22 (ref 9–23)
BUN: 14 mg/dL (ref 6–20)
CALCIUM: 9.6 mg/dL (ref 8.7–10.2)
CHLORIDE: 97 mmol/L (ref 96–106)
CO2: 24 mmol/L (ref 18–29)
Creatinine, Ser: 0.63 mg/dL (ref 0.57–1.00)
GFR calc Af Amer: 140 mL/min/{1.73_m2} (ref >59)
GFR calc non Af Amer: 122 mL/min/{1.73_m2} (ref >59)
Globulin, Total: 3 g/dL (ref 1.5–4.5)
Glucose: 78 mg/dL (ref 65–99)
POTASSIUM: 4.2 mmol/L (ref 3.5–5.2)
SODIUM: 137 mmol/L (ref 134–144)
Total Protein: 7.4 g/dL (ref 6.0–8.5)

## 2017-03-28 LAB — HEMOGLOBIN A1C

## 2017-03-29 LAB — HEMOGLOBIN A1C
Est. average glucose Bld gHb Est-mCnc: 105 mg/dL
Hgb A1c MFr Bld: 5.3 % (ref 4.8–5.6)

## 2017-03-29 LAB — SPECIMEN STATUS REPORT

## 2017-04-02 ENCOUNTER — Telehealth: Payer: Self-pay

## 2017-04-02 NOTE — Telephone Encounter (Signed)
[email or web address] covermymeds  Pa vyvanse  Form in shaws desk

## 2017-04-02 NOTE — Telephone Encounter (Signed)
Please give the to renay. Thanks. in epic what pt has failed

## 2017-04-04 ENCOUNTER — Other Ambulatory Visit: Payer: Self-pay

## 2017-04-04 ENCOUNTER — Ambulatory Visit (HOSPITAL_COMMUNITY): Payer: 59 | Attending: Cardiology

## 2017-04-04 DIAGNOSIS — E785 Hyperlipidemia, unspecified: Secondary | ICD-10-CM | POA: Insufficient documentation

## 2017-04-04 DIAGNOSIS — E669 Obesity, unspecified: Secondary | ICD-10-CM | POA: Diagnosis not present

## 2017-04-04 DIAGNOSIS — R0609 Other forms of dyspnea: Secondary | ICD-10-CM | POA: Diagnosis not present

## 2017-04-04 DIAGNOSIS — F329 Major depressive disorder, single episode, unspecified: Secondary | ICD-10-CM | POA: Insufficient documentation

## 2017-04-04 DIAGNOSIS — I471 Supraventricular tachycardia: Secondary | ICD-10-CM | POA: Diagnosis not present

## 2017-04-04 DIAGNOSIS — F419 Anxiety disorder, unspecified: Secondary | ICD-10-CM | POA: Diagnosis not present

## 2017-04-04 MED ORDER — PERFLUTREN LIPID MICROSPHERE
1.0000 mL | INTRAVENOUS | Status: AC | PRN
  Administered 2017-04-04: 2 mL via INTRAVENOUS

## 2017-04-08 NOTE — Progress Notes (Signed)
Echo results: Good news: Essentially normal echocardiogram and normal pump function and normal valve function.  Ejection fraction of 55-60% is well within the normal range.Mild abnormal relaxation is probably actually an inaccurate reading but does not take into consideration your young age.  No regional wall motion abnormalities means No signs to suggest heart attack.Bryan Lemma, MD

## 2017-04-11 ENCOUNTER — Telehealth: Payer: Self-pay

## 2017-04-11 ENCOUNTER — Telehealth: Payer: Self-pay | Admitting: Family Medicine

## 2017-04-11 NOTE — Telephone Encounter (Signed)
[facility] following up on PA for med. Call back at [phone]

## 2017-04-11 NOTE — Telephone Encounter (Signed)
Request sent again through cover my meds.  Advised by plan to check back in 5 business days.

## 2017-04-13 NOTE — Telephone Encounter (Signed)
Filed request in cover my meds yesterday.  Awaiting response.

## 2017-04-18 ENCOUNTER — Ambulatory Visit: Payer: 59 | Admitting: Family Medicine

## 2017-04-19 NOTE — Telephone Encounter (Signed)
Approval received, good through 04/11/2018. Patient notified.

## 2017-04-24 ENCOUNTER — Encounter: Payer: Self-pay | Admitting: Cardiology

## 2017-04-30 ENCOUNTER — Encounter: Payer: Self-pay | Admitting: Family Medicine

## 2017-05-13 ENCOUNTER — Encounter: Payer: Self-pay | Admitting: Family Medicine

## 2017-05-22 ENCOUNTER — Encounter: Payer: Self-pay | Admitting: Cardiology

## 2017-05-23 ENCOUNTER — Other Ambulatory Visit: Payer: Self-pay | Admitting: *Deleted

## 2017-05-23 MED ORDER — METOPROLOL SUCCINATE ER 25 MG PO TB24: 25.0000 mg | ORAL_TABLET | Freq: Every day | ORAL | 3 refills | Status: DC

## 2017-06-24 NOTE — Telephone Encounter (Signed)
Pt calling for a respond from her email that she sent June 1st please see email she wants to restart simvastatin

## 2017-06-30 ENCOUNTER — Encounter: Payer: Self-pay | Admitting: Cardiology

## 2017-06-30 ENCOUNTER — Ambulatory Visit (INDEPENDENT_AMBULATORY_CARE_PROVIDER_SITE_OTHER): Payer: 59 | Admitting: Cardiology

## 2017-06-30 DIAGNOSIS — R0609 Other forms of dyspnea: Secondary | ICD-10-CM | POA: Diagnosis not present

## 2017-06-30 DIAGNOSIS — I471 Supraventricular tachycardia: Secondary | ICD-10-CM | POA: Diagnosis not present

## 2017-06-30 MED ORDER — METOPROLOL SUCCINATE ER 50 MG PO TB24: 50.0000 mg | ORAL_TABLET | Freq: Every day | ORAL | 3 refills | Status: DC

## 2017-06-30 NOTE — Patient Instructions (Addendum)
Medication Instructions: We will INCREASE the Metoprolol (Toprol) Rx  to a 50 mg tablet daily as you are taking 25 mg 2 tab daily.   Follow-Up: Your physician wants you to follow-up in: 12 months with Dr. Herbie BaltimoreHarding.  You will receive a reminder letter in the mail two months in advance. If you don't receive a letter, please call our office to schedule the follow-up appointment.   If you need a refill on your cardiac medications before your next appointment, please call your pharmacy.

## 2017-06-30 NOTE — Progress Notes (Signed)
PCP: Sherren Mocha, MD  Clinic Note: Chief Complaint  Patient presents with  . Follow-up    Palpitations/PSVT    HPI: Rose Cortez is a 31 y.o. female who is being seen today for  folow-up evaluation of  History of PSVT/palpitations at the request of  Sherren Mocha, MD  Clifford Coudriet was last seen on 03/18/2017 -  She noted a long-standing history of SVT for several years that had been quiesced and for a while, but is now active again. She learner to use vagal maneuvers such as squatting or Valsalva the usually controlled on, but of late they have been happening more frequently and lasting longer. When I saw her last I put her on long-acting Toprol with the ability to use metoprolol tartrate when necessary. She had previously been well controlled with propranolol, but this was discontinued are not covered by her insurance company.    We did order an echocardiogram to make sure she looked okay.  Recent Hospitalizations:  none  Studies Personally Reviewed - (if available, images/films reviewed: From Epic Chart or Care Everywhere)  2-D echo 04/08/2017: Normal EF 55-60%. GR 1 DD.  Interval History:   Rose Cortez presents today feeling quite well. She has not had any recurrent SVT episodes since starting the long-acting beta blocker dose. She cannot remember, but does not think that she has had to take any of the PRN Lopressor.  She is very happy with how well the medication is working. She is now not having any near syncopal episodes. No lightheadedness or dizziness. She does not notice any exercise intolerance or fatigue from being on a beta blocker. She is working hard on dietary modification and exercise and has lost a significant amount of weight.  Cardiovascular review of symptoms negative:-  No chest pain or shortness of breath with rest or exertion.  No PND, orthopnea or edema.  No palpitations, lightheadedness, dizziness, weakness or syncope/near syncope. No TIA/amaurosis fugax symptoms. No  claudication.  ROS: A comprehensive was performed. Review of Systems  Constitutional: Positive for weight loss (~20 lb). Negative for malaise/fatigue.  HENT: Negative for congestion and nosebleeds.   Respiratory: Negative for cough, shortness of breath and wheezing.   Gastrointestinal: Negative for blood in stool, constipation and melena.  Genitourinary: Negative for dysuria, frequency and hematuria.  Neurological: Negative for dizziness.  Endo/Heme/Allergies: Does not bruise/bleed easily.  Psychiatric/Behavioral: Negative for depression and memory loss. The patient is not nervous/anxious (Much less anxious since her palpitations are better controlled) and does not have insomnia.    I have reviewed and (if needed) personally updated the patient's problem list, medications, allergies, past medical and surgical history, social and family history.   Past Medical History:  Diagnosis Date  . Anxiety   . Depression   . History of PSVT (paroxysmal supraventricular tachycardia)    managed with Beta Blockers & Vagal Maneuvers.  . Hyperlipidemia     Past Surgical History:  Procedure Laterality Date  . TRANSTHORACIC ECHOCARDIOGRAM  03/2017   Normal EF 55-60%. GR 1 DD. Normal Valves - no MVP    Current Meds  Medication Sig  . cholecalciferol (VITAMIN D) 1000 UNITS tablet Take 5,000 Units by mouth daily.  . citalopram (CELEXA) 20 MG tablet TAKE 1 TABLET BY MOUTH  DAILY  . Fish Oil OIL by Does not apply route.  . metoprolol succinate (TOPROL-XL) 50 MG 24 hr tablet Take 1 tablet (50 mg total) by mouth at bedtime.  . metoprolol tartrate (LOPRESSOR) 50  MG tablet Take 50 mg by mouth daily.  Marland Kitchen nystatin (MYCOSTATIN/NYSTOP) 100000 UNIT/GM POWD Apply 5 g topically 3 (three) times daily.  . simvastatin (ZOCOR) 40 MG tablet TAKE 1 TABLET BY MOUTH  DAILY  . [DISCONTINUED] metoprolol succinate (TOPROL XL) 25 MG 24 hr tablet Take 1 tablet (25 mg total) by mouth at bedtime.    No Known  Allergies  Social History   Social History  . Marital status: Single    Spouse name: N/A  . Number of children: N/A  . Years of education: N/A   Social History Main Topics  . Smoking status: Never Smoker  . Smokeless tobacco: Never Used  . Alcohol use Yes     Comment: weekly  . Drug use: No  . Sexual activity: Yes   Other Topics Concern  . None   Social History Narrative  . None    family history includes Depression in her sister; Hyperlipidemia in her father and mother.  Wt Readings from Last 3 Encounters:  06/30/17 265 lb 3.2 oz (120.3 kg)  03/24/17 287 lb (130.2 kg)  03/18/17 283 lb (128.4 kg)    PHYSICAL EXAM BP 129/88   Pulse 93   Ht 5\' 8"  (1.727 m)   Wt 265 lb 3.2 oz (120.3 kg)   SpO2 98%   BMI 40.32 kg/m  General appearance: alert, cooperative, appears stated age, no distress. Remains morbidly obese, but much lighter  HEENT: [place]/AT, EOMI, MMM, anicteric sclera Neck: no adenopathy, no carotid bruit and no JVD Lungs: clear to auscultation bilaterally, normal percussion bilaterally and non-labored Heart: regular rate and rhythm, S1 &S2 normal, no murmur, click, rub or gallop;   nondisplaced PMI  Abdomen: soft, non-tender; bowel sounds normal; no masses,  no organomegaly;  no HJR  Extremities: extremities normal, atraumatic, no cyanosis, and trace edema Neurologic: Mental status: Alert & oriented x 3, thought content appropriate; non-focal exam.  Pleasant mood & affect.    Adult ECG Report N/A  Other studies Reviewed: Additional studies/ records that were reviewed today include:  Recent Labs:   Lab Results  Component Value Date   CREATININE 0.63 03/24/2017   BUN 14 03/24/2017   NA 137 03/24/2017   K 4.2 03/24/2017   CL 97 03/24/2017   CO2 24 03/24/2017   Lab Results  Component Value Date   HGBA1C CANCELED 03/24/2017     ASSESSMENT / PLAN: Problem List Items Addressed This Visit    DOE (dyspnea on exertion)    Relatively normal echo would  argue that the dyspnea is probably related to body habitus and deconditioning.      PSVT (paroxysmal supraventricular tachycardia) (HCC) (Chronic)    Long-standing condition that got worse with being off beta blocker. Much better control since we have increased her Toprol to 50 mg. Continue to have the when necessary additional dose. As long as she remains asymptomatic, I think were fine treating medically and expectantly, however if symptoms do get worse or she actually has true syncope, would consider referral to EP for possible ablation.      Relevant Medications   metoprolol tartrate (LOPRESSOR) 50 MG tablet   metoprolol succinate (TOPROL-XL) 50 MG 24 hr tablet      Current medicines are reviewed at length with the patient today. (+/- concerns) N/A The following changes have been made: NO CHANGE  Patient Instructions  Medication Instructions: We will INCREASE the Metoprolol (Toprol) Rx  to a 50 mg tablet daily as you are taking 25  mg 2 tab daily.   Follow-Up: Your physician wants you to follow-up in: 12 months with Dr. Herbie BaltimoreHarding.  You will receive a reminder letter in the mail two months in advance. If you don't receive a letter, please call our office to schedule the follow-up appointment.   If you need a refill on your cardiac medications before your next appointment, please call your pharmacy.     Studies Ordered:   No orders of the defined types were placed in this encounter.     Bryan Lemmaavid Burney Calzadilla, M.D., M.S. Interventional Cardiologist   Pager # [phone] Phone # [phone] [address] [place], [place] [id number]

## 2017-07-02 ENCOUNTER — Encounter: Payer: Self-pay | Admitting: Cardiology

## 2017-07-02 NOTE — Assessment & Plan Note (Signed)
Relatively normal echo would argue that the dyspnea is probably related to body habitus and deconditioning.

## 2017-07-02 NOTE — Assessment & Plan Note (Addendum)
Long-standing condition that got worse with being off beta blocker. Much better control since we have increased her Toprol to 50 mg. Continue to have the when necessary additional dose. As long as she remains asymptomatic, I think were fine treating medically and expectantly, however if symptoms do get worse or she actually has true syncope, would consider referral to EP for possible ablation.

## 2017-08-18 ENCOUNTER — Ambulatory Visit (INDEPENDENT_AMBULATORY_CARE_PROVIDER_SITE_OTHER): Payer: 59 | Admitting: Family Medicine

## 2017-08-18 ENCOUNTER — Encounter: Payer: Self-pay | Admitting: Family Medicine

## 2017-08-18 VITALS — BP 144/87 | HR 96 | Temp 98.4°F | Resp 16 | Ht 67.72 in | Wt 264.0 lb

## 2017-08-18 DIAGNOSIS — Z124 Encounter for screening for malignant neoplasm of cervix: Secondary | ICD-10-CM

## 2017-08-18 DIAGNOSIS — E785 Hyperlipidemia, unspecified: Secondary | ICD-10-CM

## 2017-08-18 DIAGNOSIS — I471 Supraventricular tachycardia: Secondary | ICD-10-CM | POA: Diagnosis not present

## 2017-08-18 DIAGNOSIS — Z1389 Encounter for screening for other disorder: Secondary | ICD-10-CM | POA: Diagnosis not present

## 2017-08-18 DIAGNOSIS — Z113 Encounter for screening for infections with a predominantly sexual mode of transmission: Secondary | ICD-10-CM | POA: Diagnosis not present

## 2017-08-18 DIAGNOSIS — Z136 Encounter for screening for cardiovascular disorders: Secondary | ICD-10-CM

## 2017-08-18 DIAGNOSIS — Z1329 Encounter for screening for other suspected endocrine disorder: Secondary | ICD-10-CM

## 2017-08-18 DIAGNOSIS — F411 Generalized anxiety disorder: Secondary | ICD-10-CM | POA: Diagnosis not present

## 2017-08-18 DIAGNOSIS — E6609 Other obesity due to excess calories: Secondary | ICD-10-CM | POA: Diagnosis not present

## 2017-08-18 DIAGNOSIS — R87612 Low grade squamous intraepithelial lesion on cytologic smear of cervix (LGSIL): Secondary | ICD-10-CM | POA: Insufficient documentation

## 2017-08-18 DIAGNOSIS — Z3009 Encounter for other general counseling and advice on contraception: Secondary | ICD-10-CM | POA: Diagnosis not present

## 2017-08-18 DIAGNOSIS — Z0000 Encounter for general adult medical examination without abnormal findings: Secondary | ICD-10-CM

## 2017-08-18 DIAGNOSIS — Z130 Encounter for screening for diseases of the blood and blood-forming organs and certain disorders involving the immune mechanism: Secondary | ICD-10-CM

## 2017-08-18 DIAGNOSIS — Z1383 Encounter for screening for respiratory disorder NEC: Secondary | ICD-10-CM | POA: Diagnosis not present

## 2017-08-18 HISTORY — DX: Low grade squamous intraepithelial lesion on cytologic smear of cervix (LGSIL): R87.612

## 2017-08-18 LAB — POCT URINALYSIS DIP (MANUAL ENTRY)
BILIRUBIN UA: NEGATIVE
BILIRUBIN UA: NEGATIVE mg/dL
GLUCOSE UA: NEGATIVE mg/dL
Leukocytes, UA: NEGATIVE
Nitrite, UA: NEGATIVE
Protein Ur, POC: NEGATIVE mg/dL
RBC UA: NEGATIVE
SPEC GRAV UA: 1.025 (ref 1.010–1.025)
UROBILINOGEN UA: 0.2 U/dL
pH, UA: 7 (ref 5.0–8.0)

## 2017-08-18 MED ORDER — NORGESTIMATE-ETH ESTRADIOL 0.25-35 MG-MCG PO TABS: 1.0000 | ORAL_TABLET | Freq: Every day | ORAL | 4 refills | Status: DC

## 2017-08-18 MED ORDER — PHENTERMINE HCL 37.5 MG PO TABS
37.5000 mg | ORAL_TABLET | Freq: Every day | ORAL | 0 refills | Status: DC
Start: 2017-08-18 — End: 2017-10-14

## 2017-08-18 NOTE — Patient Instructions (Addendum)
IF you received an x-ray today, you will receive an invoice from [facility]. Please contact [facility] at [phone] with questions or concerns regarding your invoice.   IF you received labwork today, you will receive an invoice from [facility]. Please contact [facility] at [phone] with questions or concerns regarding your invoice.   Our billing staff will not be able to assist you with questions regarding bills from these companies.  You will be contacted with the lab results as soon as they are available. The fastest way to get your results is to activate your My Chart account. Instructions are located on the last page of this paperwork. If you have not heard from us regarding the results in 2 weeks, please contact this office.      Oral Contraception Use Oral contraceptive pills (OCPs) are medicines taken to prevent pregnancy. OCPs work by preventing the ovaries from releasing eggs. The hormones in OCPs also cause the cervical mucus to thicken, preventing the sperm from entering the uterus. The hormones also cause the uterine lining to become thin, not allowing a fertilized egg to attach to the inside of the uterus. OCPs are highly effective when taken exactly as prescribed. However, OCPs do not prevent sexually transmitted diseases (STDs). Safe sex practices, such as using condoms along with an OCP, can help prevent STDs. Before taking OCPs, you may have a physical exam and Pap test. Your health care provider may also order blood tests if necessary. Your health care provider will make sure you are a good candidate for oral contraception. Discuss with your health care provider the possible side effects of the OCP you may be prescribed. When starting an OCP, it can take 2 to 3 months for the body to adjust to the changes in hormone levels in your body. How to take oral contraceptive pills Your health care provider may advise you on how to start taking the first cycle  of OCPs. Otherwise, you can:  Start on day 1 of your menstrual period. You will not need any backup contraceptive protection with this start time.  Start on the first Sunday after your menstrual period or the day you get your prescription. In these cases, you will need to use backup contraceptive protection for the first week.  Start the pill at any time of your cycle. If you take the pill within 5 days of the start of your period, you are protected against pregnancy right away. In this case, you will not need a backup form of birth control. If you start at any other time of your menstrual cycle, you will need to use another form of birth control for 7 days. If your OCP is the type called a minipill, it will protect you from pregnancy after taking it for 2 days (48 hours).  After you have started taking OCPs:  If you forget to take 1 pill, take it as soon as you remember. Take the next pill at the regular time.  If you miss 2 or more pills, call your health care provider because different pills have different instructions for missed doses. Use backup birth control until your next menstrual period starts.  If you use a 28-day pack that contains inactive pills and you miss 1 of the last 7 pills (pills with no hormones), it will not matter. Throw away the rest of the non-hormone pills and start a new pill pack.  No matter which day you start the OCP, you will always start  a new pack on that same day of the week. Have an extra pack of OCPs and a backup contraceptive method available in case you miss some pills or lose your OCP pack. Follow these instructions at home:  Do not smoke.  Always use a condom to protect against STDs. OCPs do not protect against STDs.  Use a calendar to mark your menstrual period days.  Read the information and directions that came with your OCP. Talk to your health care provider if you have questions. Contact a health care provider if:  You develop nausea and  vomiting.  You have abnormal vaginal discharge or bleeding.  You develop a rash.  You miss your menstrual period.  You are losing your hair.  You need treatment for mood swings or depression.  You get dizzy when taking the OCP.  You develop acne from taking the OCP.  You become pregnant. Get help right away if:  You develop chest pain.  You develop shortness of breath.  You have an uncontrolled or severe headache.  You develop numbness or slurred speech.  You develop visual problems.  You develop pain, redness, and swelling in the legs. This information is not intended to replace advice given to you by your health care provider. Make sure you discuss any questions you have with your health care provider. Document Released: 11/18/2011 Document Revised: 05/06/2016 Document Reviewed: 05/20/2013 Elsevier Interactive Patient Education  2017 Elsevier Inc.  Oral Contraception Information Oral contraceptive pills (OCPs) are medicines taken to prevent pregnancy. OCPs work by preventing the ovaries from releasing eggs. The hormones in OCPs also cause the cervical mucus to thicken, preventing the sperm from entering the uterus. The hormones also cause the uterine lining to become thin, not allowing a fertilized egg to attach to the inside of the uterus. OCPs are highly effective when taken exactly as prescribed. However, OCPs do not prevent sexually transmitted diseases (STDs). Safe sex practices, such as using condoms along with the pill, can help prevent STDs. Before taking the pill, you may have a physical exam and Pap test. Your health care provider may order blood tests. The health care provider will make sure you are a good candidate for oral contraception. Discuss with your health care provider the possible side effects of the OCP you may be prescribed. When starting an OCP, it can take 2 to 3 months for the body to adjust to the changes in hormone levels in your body. Types of oral  contraception  The combination pill-This pill contains estrogen and progestin (synthetic progesterone) hormones. The combination pill comes in 21-day, 28-day, or 91-day packs. Some types of combination pills are meant to be taken continuously (365-day pills). With 21-day packs, you do not take pills for 7 days after the last pill. With 28-day packs, the pill is taken every day. The last 7 pills are without hormones. Certain types of pills have more than 21 hormone-containing pills. With 91-day packs, the first 84 pills contain both hormones, and the last 7 pills contain no hormones or contain estrogen only.  The minipill-This pill contains the progesterone hormone only. The pill is taken every day continuously. It is very important to take the pill at the same time each day. The minipill comes in packs of 28 pills. All 28 pills contain the hormone. Advantages of oral contraceptive pills  Decreases premenstrual symptoms.  Treats menstrual period cramps.  Regulates the menstrual cycle.  Decreases a heavy menstrual flow.  May treatacne, depending on the  type of pill.  Treats abnormal uterine bleeding.  Treats polycystic ovarian syndrome.  Treats endometriosis.  Can be used as emergency contraception. Things that can make oral contraceptive pills less effective OCPs can be less effective if:  You forget to take the pill at the same time every day.  You have a stomach or intestinal disease that lessens the absorption of the pill.  You take OCPs with other medicines that make OCPs less effective, such as antibiotics, certain HIV medicines, and some seizure medicines.  You take expired OCPs.  You forget to restart the pill on day 7, when using the packs of 21 pills.  Risks associated with oral contraceptive pills Oral contraceptive pills can sometimes cause side effects, such as:  Headache.  Nausea.  Breast tenderness.  Irregular bleeding or spotting.  Combination pills are  also associated with a small increased risk of:  Blood clots.  Heart attack.  Stroke.  This information is not intended to replace advice given to you by your health care provider. Make sure you discuss any questions you have with your health care provider. Document Released: 02/19/2003 Document Revised: 05/06/2016 Document Reviewed: 05/20/2013 Elsevier Interactive Patient Education  [employer].

## 2017-08-18 NOTE — Progress Notes (Addendum)
Subjective:    Patient ID: Rose Cortez, female    DOB: [birth year], 31 y.o.   MRN: [id number] Chief Complaint  Patient presents with  . Annual Exam    HPI  Primary Preventative Screenings: Cervical Cancer: pap done 07/14/15 nml - will be due for repeat ne Family Planning: STI screening: Breast Cancer: Colorectal Cancer: Tobacco use/EtOH/substances: Bone Density: Cardiac: Weight/Blood sugar/Diet/Exercise: OTC/Vit/Supp/Herbal: Dentist/Optho: Immunizations:  Immunization History  Administered Date(s) Administered  . Influenza,inj,Quad PF,6+ Mos 08/19/2016  . Influenza-Unspecified 09/12/2015  . Tdap 07/14/2015   Past Medical History:  Diagnosis Date  . Anxiety   . Depression   . History of PSVT (paroxysmal supraventricular tachycardia)    managed with Beta Blockers & Vagal Maneuvers.  . Hyperlipidemia    Past Surgical History:  Procedure Laterality Date  . TRANSTHORACIC ECHOCARDIOGRAM  03/2017   Normal EF 55-60%. GR 1 DD. Normal Valves - no MVP   Current Outpatient Prescriptions on File Prior to Visit  Medication Sig Dispense Refill  . cholecalciferol (VITAMIN D) 1000 UNITS tablet Take 5,000 Units by mouth daily.    . Fish Oil OIL by Does not apply route.    . metoprolol succinate (TOPROL-XL) 50 MG 24 hr tablet Take 1 tablet (50 mg total) by mouth at bedtime. 90 tablet 3  . metoprolol tartrate (LOPRESSOR) 50 MG tablet Take 50 mg by mouth daily.    . simvastatin (ZOCOR) 40 MG tablet TAKE 1 TABLET BY MOUTH  DAILY 90 tablet 0   No current facility-administered medications on file prior to visit.    No Known Allergies Family History  Problem Relation Age of Onset  . Hyperlipidemia Mother   . Hyperlipidemia Father   . Depression Sister    Social History   Social History  . Marital status: Single    Spouse name: N/A  . Number of children: N/A  . Years of education: N/A   Social History Main Topics  . Smoking status: Never Smoker  . Smokeless tobacco: Never  Used  . Alcohol use Yes     Comment: weekly  . Drug use: No  . Sexual activity: Yes   Other Topics Concern  . None   Social History Narrative  . None     Chronic Medical Conditions: Feels good now that she is off the citalopram. Needs to go back on birth control Restarted simvastatin in July Periods more reg On Yaz  Prior but not sure she liked skipping periods. No changed noticed on Vyvanse wortking out twice a week but 2x is w/ yoga Spermicide use  Past Medical History:  Diagnosis Date  . Anxiety   . Depression   . History of PSVT (paroxysmal supraventricular tachycardia)    managed with Beta Blockers & Vagal Maneuvers.  . Hyperlipidemia    Past Surgical History:  Procedure Laterality Date  . TRANSTHORACIC ECHOCARDIOGRAM  03/2017   Normal EF 55-60%. GR 1 DD. Normal Valves - no MVP   Current Outpatient Prescriptions on File Prior to Visit  Medication Sig Dispense Refill  . cholecalciferol (VITAMIN D) 1000 UNITS tablet Take 5,000 Units by mouth daily.    . Fish Oil OIL by Does not apply route.    . metoprolol succinate (TOPROL-XL) 50 MG 24 hr tablet Take 1 tablet (50 mg total) by mouth at bedtime. 90 tablet 3  . metoprolol tartrate (LOPRESSOR) 50 MG tablet Take 50 mg by mouth daily.    . simvastatin (ZOCOR) 40 MG tablet TAKE 1 TABLET BY MOUTH  DAILY 90 tablet 0   No current facility-administered medications on file prior to visit.    No Known Allergies Family History  Problem Relation Age of Onset  . Hyperlipidemia Mother   . Hyperlipidemia Father   . Depression Sister    Social History   Social History  . Marital status: Single    Spouse name: N/A  . Number of children: N/A  . Years of education: N/A   Social History Main Topics  . Smoking status: Never Smoker  . Smokeless tobacco: Never Used  . Alcohol use Yes     Comment: weekly  . Drug use: No  . Sexual activity: Yes   Other Topics Concern  . None   Social History Narrative  . None    Depression screen [facility] 2/9 08/18/2017 03/24/2017 02/14/2017 02/07/2017 02/03/2017  Decreased Interest 0 0 0 0 0  Down, Depressed, Hopeless 0 0 0 0 0  PHQ - 2 Score 0 0 0 0 0     Review of Systems  All other systems reviewed and are negative. See hpi    Objective:   Physical Exam  Constitutional: She is oriented to person, place, and time. She appears well-developed and well-nourished. No distress.  HENT:  Head: Normocephalic and atraumatic.  Right Ear: Tympanic membrane, external ear and ear canal normal.  Left Ear: Tympanic membrane, external ear and ear canal normal.  Nose: No mucosal edema or rhinorrhea.  Mouth/Throat: Uvula is midline, oropharynx is clear and moist and mucous membranes are normal. No posterior oropharyngeal erythema.  Eyes: Pupils are equal, round, and reactive to light. Conjunctivae and EOM are normal. Right eye exhibits no discharge. Left eye exhibits no discharge. No scleral icterus.  Neck: Normal range of motion. Neck supple. No thyromegaly present.  Cardiovascular: Normal rate, regular rhythm, normal heart sounds and intact distal pulses.   Pulmonary/Chest: Effort normal and breath sounds normal. No respiratory distress.  Abdominal: Soft. Bowel sounds are normal. There is no tenderness.  Genitourinary: Vagina normal and uterus normal. No breast swelling, tenderness, discharge or bleeding. Cervix exhibits no motion tenderness and no friability. Right adnexum displays no mass and no tenderness. Left adnexum displays no mass and no tenderness.  Musculoskeletal: She exhibits no edema.  Lymphadenopathy:    She has no cervical adenopathy.  Neurological: She is alert and oriented to person, place, and time. She has normal reflexes.  Skin: Skin is warm and dry. She is not diaphoretic. No erythema.  Psychiatric: She has a normal mood and affect. Her behavior is normal.      BP (!) 144/87   Pulse 96   Temp 98.4 F (36.9 C) (Oral)   Resp 16   Ht 5' 7.72" (1.72 m)    Wt 264 lb (119.7 kg)   LMP 07/21/2017 (Approximate)   SpO2 100%   BMI 40.48 kg/m      Assessment & Plan:   1. Annual physical exam   2. Routine screening for STI (sexually transmitted infection)   3. Screening for cardiovascular, respiratory, and genitourinary diseases   4. Screening for cervical cancer   5. Screening for deficiency anemia   6. Screening for thyroid disorder   7. Dyslipidemia   8. Class 2 obesity due to excess calories without serious comorbidity in adult, unspecified BMI - start trial of 1/2 tab qam and increase as tolerated. Recheck in OV in 1 mo to ensure not exacerbating below - prior stimulants tried have not.  9. PSVT (paroxysmal supraventricular tachycardia) (  HCC)   10. GAD (generalized anxiety disorder)   11. Family planning - start OCPs - reviewed options of eliminating menses at times w/ back-to-back pill packs.   LGSIL on pap - refer to gyn for colopscopy  Orders Placed This Encounter  Procedures  . CBC with Differential/Platelet  . Comprehensive metabolic panel    Order Specific Question:   Has the patient fasted?    Answer:   Yes  . Lipid panel    Order Specific Question:   Has the patient fasted?    Answer:   Yes  . TSH  . HCV Ab w/Rflx to Verification  . HIV antibody  . RPR  . CBC with Differential/Platelet  . Comprehensive metabolic panel  . Lipid panel  . TSH  . Interpretation:  . POCT urinalysis dipstick  . POCT urine pregnancy    Meds ordered this encounter  Medications  . norgestimate-ethinyl estradiol (ORTHO-CYCLEN,SPRINTEC,PREVIFEM) 0.25-35 MG-MCG tablet    Sig: Take 1 tablet by mouth daily.    Dispense:  3 Package    Refill:  4  . phentermine (ADIPEX-P) 37.5 MG tablet    Sig: Take 1 tablet (37.5 mg total) by mouth daily before breakfast.    Dispense:  30 tablet    Refill:  0     Norberto Sorenson, M.D.  Primary Care at [facility] [address] [place], [place] [address] [phone] phone [phone]  fax  08/24/17 10:26 PM

## 2017-08-19 LAB — COMPREHENSIVE METABOLIC PANEL
A/G RATIO: 1.5 (ref 1.2–2.2)
ALK PHOS: 64 IU/L (ref 39–117)
ALT: 28 IU/L (ref 0–32)
AST: 20 IU/L (ref 0–40)
Albumin: 4.6 g/dL (ref 3.5–5.5)
BUN / CREAT RATIO: 25 — AB (ref 9–23)
BUN: 15 mg/dL (ref 6–20)
Bilirubin Total: 0.4 mg/dL (ref 0.0–1.2)
CO2: 23 mmol/L (ref 20–29)
Calcium: 9.7 mg/dL (ref 8.7–10.2)
Chloride: 101 mmol/L (ref 96–106)
Creatinine, Ser: 0.6 mg/dL (ref 0.57–1.00)
GFR calc Af Amer: 141 mL/min/{1.73_m2} (ref >59)
GFR, EST NON AFRICAN AMERICAN: 123 mL/min/{1.73_m2} (ref >59)
GLOBULIN, TOTAL: 3 g/dL (ref 1.5–4.5)
Glucose: 77 mg/dL (ref 65–99)
Potassium: 4.4 mmol/L (ref 3.5–5.2)
SODIUM: 139 mmol/L (ref 134–144)
Total Protein: 7.6 g/dL (ref 6.0–8.5)

## 2017-08-19 LAB — HIV ANTIBODY (ROUTINE TESTING W REFLEX): HIV Screen 4th Generation wRfx: NONREACTIVE

## 2017-08-19 LAB — LIPID PANEL
CHOL/HDL RATIO: 4.7 ratio — AB (ref 0.0–4.4)
CHOLESTEROL TOTAL: 187 mg/dL (ref 100–199)
HDL: 40 mg/dL (ref >39)
LDL CALC: 97 mg/dL (ref 0–99)
TRIGLYCERIDES: 248 mg/dL — AB (ref 0–149)
VLDL CHOLESTEROL CAL: 50 mg/dL — AB (ref 5–40)

## 2017-08-19 LAB — CBC WITH DIFFERENTIAL/PLATELET
BASOS: 0 %
Basophils Absolute: 0 10*3/uL (ref 0.0–0.2)
EOS (ABSOLUTE): 0.2 10*3/uL (ref 0.0–0.4)
EOS: 2 %
HEMATOCRIT: 42 % (ref 34.0–46.6)
Hemoglobin: 13.9 g/dL (ref 11.1–15.9)
Immature Grans (Abs): 0 10*3/uL (ref 0.0–0.1)
Immature Granulocytes: 0 %
LYMPHS ABS: 2.5 10*3/uL (ref 0.7–3.1)
Lymphs: 25 %
MCH: 29.1 pg (ref 26.6–33.0)
MCHC: 33.1 g/dL (ref 31.5–35.7)
MCV: 88 fL (ref 79–97)
MONOS ABS: 0.4 10*3/uL (ref 0.1–0.9)
Monocytes: 5 %
NEUTROS ABS: 6.6 10*3/uL (ref 1.4–7.0)
Neutrophils: 68 %
Platelets: 400 10*3/uL — ABNORMAL HIGH (ref 150–379)
RBC: 4.78 x10E6/uL (ref 3.77–5.28)
RDW: 14.3 % (ref 12.3–15.4)
WBC: 9.8 10*3/uL (ref 3.4–10.8)

## 2017-08-19 LAB — TSH: TSH: 0.854 u[IU]/mL (ref 0.450–4.500)

## 2017-08-19 LAB — HCV INTERPRETATION

## 2017-08-19 LAB — HCV AB W/RFLX TO VERIFICATION: HCV Ab: <0.1 s/co ratio (ref 0.0–0.9)

## 2017-08-19 LAB — RPR: RPR: NONREACTIVE

## 2017-08-22 LAB — PAP IG, CT-NG, RFX HPV ASCU
Chlamydia, Nuc. Acid Amp: NEGATIVE
Gonococcus by Nucleic Acid Amp: NEGATIVE
PAP Smear Comment: 0

## 2017-08-26 ENCOUNTER — Encounter: Payer: Self-pay | Admitting: Family Medicine

## 2017-09-10 ENCOUNTER — Other Ambulatory Visit: Payer: Self-pay | Admitting: Family Medicine

## 2017-09-13 MED ORDER — SIMVASTATIN 40 MG PO TABS: 40.0000 mg | ORAL_TABLET | Freq: Every day | ORAL | 0 refills | Status: DC

## 2017-09-13 NOTE — Telephone Encounter (Signed)
Req for refill Zocor . Refilled 90 days -0- refills Note sent to Dr. Clelia Croft to confirm  dose appropriate.

## 2017-09-16 MED ORDER — SIMVASTATIN 40 MG PO TABS: 40.0000 mg | ORAL_TABLET | Freq: Every day | ORAL | 3 refills | Status: DC

## 2017-09-16 NOTE — Addendum Note (Signed)
Addended by: Sherren Mocha on: 09/16/2017 01:15 PM   Modules accepted: Orders

## 2017-09-19 ENCOUNTER — Ambulatory Visit: Payer: 59 | Admitting: Family Medicine

## 2017-09-26 ENCOUNTER — Telehealth: Payer: Self-pay | Admitting: Obstetrics & Gynecology

## 2017-09-26 DIAGNOSIS — R87810 Cervical high risk human papillomavirus (HPV) DNA test positive: Secondary | ICD-10-CM

## 2017-09-26 DIAGNOSIS — R87612 Low grade squamous intraepithelial lesion on cytologic smear of cervix (LGSIL): Secondary | ICD-10-CM

## 2017-09-26 NOTE — Telephone Encounter (Signed)
Called and left a message for patient to call back to schedule a new patient doctor referral for a colposcopy. See separate staff message.

## 2017-09-26 NOTE — Telephone Encounter (Signed)
Patient is returning a call to [place]. Patient is asking for a 3:30pm appointment if possible.

## 2017-09-27 NOTE — Telephone Encounter (Signed)
Called and left a message for patient to call back to schedule a new patient doctor referral. °

## 2017-09-28 NOTE — Telephone Encounter (Signed)
[facility] patient scheduled for colposcopy on 10/14/17 at 10am with Dr. Hyacinth MeekerMiller.   Spoke with patient, LMP 09/21/17. OCP for contraceptive. Advised patient may keep colpo as scheduled. Take Motrin 800 mg with food and water one hour before procedure. Patient verbalizes understanding and is agreeable.  Order placed for colposcopy for LGSIL pap of cervix  Routing to provider for final review. Patient is agreeable to disposition. Will close encounter.  Cc: Harland DingwallSuzy Dixon

## 2017-10-13 HISTORY — PX: COLPOSCOPY: SHX161

## 2017-10-14 ENCOUNTER — Encounter: Payer: Self-pay | Admitting: Obstetrics & Gynecology

## 2017-10-14 ENCOUNTER — Ambulatory Visit (INDEPENDENT_AMBULATORY_CARE_PROVIDER_SITE_OTHER): Payer: 59 | Admitting: Obstetrics & Gynecology

## 2017-10-14 ENCOUNTER — Ambulatory Visit: Payer: 59 | Admitting: Obstetrics & Gynecology

## 2017-10-14 DIAGNOSIS — R87612 Low grade squamous intraepithelial lesion on cytologic smear of cervix (LGSIL): Secondary | ICD-10-CM | POA: Diagnosis not present

## 2017-10-14 NOTE — Progress Notes (Signed)
31 y.o. G0P0000 Single Caucasian F here for new patient visit and evaluation of LGSIL pap smear that was obtained with AEX.  This was first abnormal pap smear for this patient.  She is a non-smoker.  Pap smear reviewed, procedure reviewed.  Questions answered.    Has completed Gardisil vaccination.  PCP: Dr. Norberto SorensonEva Shaw  Patient's last menstrual period was 09/21/2017.          Sexually active: Yes.    The current method of family planning is OCP (estrogen/progesterone).    Exercising: Yes.    yoga, spinning Smoker:  no  Health Maintenance: Pap:  08/18/17 LGSIL, 07/14/15 negative  History of abnormal Pap:  yes MMG:  never Colonoscopy:  never BMD:   never TDaP:  07/14/15  Pneumonia vaccine(s):  never Zostavax:   never Hep C testing: 08/18/17 negative  Screening Labs: PCP, Hb today: PCP   reports that she has never smoked. She has never used smokeless tobacco. She reports that she drinks alcohol. She reports that she does not use drugs.  Past Medical History:  Diagnosis Date  . Anxiety   . Depression   . History of PSVT (paroxysmal supraventricular tachycardia)    managed with Beta Blockers & Vagal Maneuvers.  . Hyperlipidemia     Past Surgical History:  Procedure Laterality Date  . TRANSTHORACIC ECHOCARDIOGRAM  03/2017   Normal EF 55-60%. GR 1 DD. Normal Valves - no MVP    Current Outpatient Prescriptions  Medication Sig Dispense Refill  . cholecalciferol (VITAMIN D) 1000 UNITS tablet Take 5,000 Units by mouth daily.    . Fish Oil OIL by Does not apply route.    . metoprolol succinate (TOPROL-XL) 50 MG 24 hr tablet Take 1 tablet (50 mg total) by mouth at bedtime. 90 tablet 3  . metoprolol tartrate (LOPRESSOR) 50 MG tablet Take 50 mg by mouth daily.    . norgestimate-ethinyl estradiol (ORTHO-CYCLEN,SPRINTEC,PREVIFEM) 0.25-35 MG-MCG tablet Take 1 tablet by mouth daily. 3 Package 4  . simvastatin (ZOCOR) 40 MG tablet Take 1 tablet (40 mg total) by mouth daily. 90 tablet 3   No  current facility-administered medications for this visit.     Family History  Problem Relation Age of Onset  . Hyperlipidemia Mother   . Breast cancer Mother   . Hyperlipidemia Father   . Depression Sister   . Heart disease Maternal Grandfather   . Lung cancer Paternal Grandfather     ROS:  Pertinent items are noted in HPI.  Otherwise, a comprehensive ROS was negative.  Exam:   BP 132/70 (BP Location: Right Arm, Patient Position: Sitting, Cuff Size: Large)   Pulse 94   Resp 14   Ht 5' 8.75" (1.746 m)   Wt 270 lb 8 oz (122.7 kg)   LMP 09/21/2017   BMI 40.24 kg/m     Height: 5' 8.75" (174.6 cm)  Ht Readings from Last 3 Encounters:  10/14/17 5' 8.75" (1.746 m)  08/18/17 5' 7.72" (1.72 m)  06/30/17 5\' 8"  (1.727 m)   Physical Exam  Constitutional: She is oriented to person, place, and time. She appears well-developed and well-nourished.  Genitourinary: Vagina normal and uterus normal. There is no rash, tenderness, lesion or injury on the right labia. There is no rash, tenderness, lesion or injury on the left labia. Cervix exhibits no motion tenderness, no discharge and no friability.    Lymphadenopathy:       Right: No inguinal adenopathy present.       Left:  No inguinal adenopathy present.  Neurological: She is alert and oriented to person, place, and time.  Skin: Skin is warm and dry.  Psychiatric: She has a normal mood and affect.   Procedure:  Speculum placed.  Cervix visualized with colposcope.  3% acetic acid applied to cervix.  After 3 minutes, cervix visualized with 7.5X and 15X magnification.  Green filter used.  Area of AWE noted at 3 o'clock.  Decreased staining noted with Lugol's.  Biopsy obtained at 3 o'clock.  ECC obtained.  Monsel's applied for excellent hemostasis.  Pt tolerated procedure well.  Speculum removed.   Chaperone was present for exam.  A:  LGSIL pap smear  P:   BIopsy and ECC pending.  If CIN 1 or less, pt will need repeat pap smear and HR HPV in  one year.  Post procedure instructions given.

## 2017-10-19 ENCOUNTER — Telehealth: Payer: Self-pay

## 2017-10-19 NOTE — Telephone Encounter (Signed)
-----   Message from Jerene BearsMary S Miller, MD sent at 10/18/2017  5:08 PM EST ----- Please let pt know her biopsy and ECC showed no abnormal cells.  She needs a pap and HR HPV test in one year.  Please schedule.  Ok to just be pap appt as typically sees PCP.  08 recall.

## 2017-10-19 NOTE — Telephone Encounter (Signed)
Spoke with patient. Advised of results as seen below. Patient verbalizes understanding. Patient would like to return for aex with Dr.Miller. Aex schedueld for 10/20/2017 at 8:30 am with Dr.Miller. 08 recall placed.  Routing to provider for final review. Patient agreeable to disposition. Will close encounter.

## 2018-02-01 ENCOUNTER — Encounter: Payer: Self-pay | Admitting: Family Medicine

## 2018-04-14 ENCOUNTER — Ambulatory Visit: Payer: 59 | Admitting: Family Medicine

## 2018-04-20 ENCOUNTER — Ambulatory Visit: Payer: BLUE CROSS/BLUE SHIELD | Admitting: Family Medicine

## 2018-04-20 ENCOUNTER — Encounter: Payer: Self-pay | Admitting: Family Medicine

## 2018-04-20 ENCOUNTER — Other Ambulatory Visit: Payer: Self-pay

## 2018-04-20 VITALS — BP 134/78 | HR 98 | Temp 98.8°F | Resp 18 | Ht 68.62 in | Wt 250.6 lb

## 2018-04-20 DIAGNOSIS — Z6837 Body mass index (BMI) 37.0-37.9, adult: Secondary | ICD-10-CM

## 2018-04-20 DIAGNOSIS — F331 Major depressive disorder, recurrent, moderate: Secondary | ICD-10-CM

## 2018-04-20 DIAGNOSIS — I471 Supraventricular tachycardia: Secondary | ICD-10-CM

## 2018-04-20 DIAGNOSIS — F411 Generalized anxiety disorder: Secondary | ICD-10-CM

## 2018-04-20 DIAGNOSIS — E785 Hyperlipidemia, unspecified: Secondary | ICD-10-CM | POA: Diagnosis not present

## 2018-04-20 MED ORDER — BUSPIRONE HCL 15 MG PO TABS
7.5000 mg | ORAL_TABLET | Freq: Two times a day (BID) | ORAL | 2 refills | Status: DC
Start: 2018-04-20 — End: 2018-05-12

## 2018-04-20 MED ORDER — DOXEPIN HCL 10 MG PO CAPS: 10.0000 mg | ORAL_CAPSULE | Freq: Every day | ORAL | 2 refills | Status: DC

## 2018-04-20 NOTE — Progress Notes (Signed)
Subjective:    Patient ID: Rose Cortez, female    DOB: 1986-11-13, 32 y.o.   MRN: [id number] Chief Complaint  Patient presents with  . Anxiety    screening done  . Depression    Screening done  . Follow-up    pt states that she would like new medication    HPI   Obesity Started [employer] and goes to classes 2-4x/wk - combination .  The phenteramine made her irritable and HA and so took 2 pills and threw away the rest.  Cooking her own meals and avoid fast food to a few times/mo and curbing a sleep a tooth.   Anxiety and Insomnia Anxiety is starting to creep back in. Hard time falling asleep at night ruminating about things that happened years ago - a constant loop in her head.   Goes to yoga sometimes and does on her own - practices breathing techniques and focusing on parts of her body to relax. Pretty much continuous throuhgout the day but is able to be present in he morning and do her job.  Was on zoloft in high school, paxil in college, the citalopram for 4 years until last yr when she went off when she started living a healthier lifestyle - worked initially but eventually stopped. Paxil was the worst of the 4 things she has tried as got up to higher dose and w/d sxs with electric shock when went off.  wellbutrin didn't do anything  Will take ibuprofen pm 2 at night. When she doesn't take it - will be laying there for 1-3 hrs before falling asleep.   Has been taking vitamin D 5000 iu qod for the past 2 mos when her dad found out he was very deficient and was the cause of his persistent generalized malaise and moods sxs.    Used to have a heavier perior for shorter days and now with OCPs have every 4th Wednesday having a 5-6d but ligher menses.   Past Medical History:  Diagnosis Date  . Anxiety   . Depression   . History of PSVT (paroxysmal supraventricular tachycardia)    managed with Beta Blockers & Vagal Maneuvers.  . Hyperlipidemia    Past Surgical History:    Procedure Laterality Date  . TRANSTHORACIC ECHOCARDIOGRAM  03/2017   Normal EF 55-60%. GR 1 DD. Normal Valves - no MVP   Current Outpatient Medications on File Prior to Visit  Medication Sig Dispense Refill  . cholecalciferol (VITAMIN D) 1000 UNITS tablet Take 5,000 Units by mouth daily.    . metoprolol succinate (TOPROL-XL) 50 MG 24 hr tablet Take 1 tablet (50 mg total) by mouth at bedtime. 90 tablet 3  . metoprolol tartrate (LOPRESSOR) 50 MG tablet Take 50 mg by mouth daily.    . norgestimate-ethinyl estradiol (ORTHO-CYCLEN,SPRINTEC,PREVIFEM) 0.25-35 MG-MCG tablet Take 1 tablet by mouth daily. 3 Package 4  . simvastatin (ZOCOR) 40 MG tablet Take 1 tablet (40 mg total) by mouth daily. 90 tablet 3  . Fish Oil OIL by Does not apply route.     No current facility-administered medications on file prior to visit.    No Known Allergies Family History  Problem Relation Age of Onset  . Hyperlipidemia Mother   . Breast cancer Mother   . Hyperlipidemia Father   . Depression Sister   . Heart disease Maternal Grandfather   . Lung cancer Paternal Grandfather    Social History   Socioeconomic History  . Marital status: Single  Spouse name: Not on file  . Number of children: 0  . Years of education: Not on file  . Highest education level: Not on file  Occupational History  . Not on file  Social Needs  . Financial resource strain: Not on file  . Food insecurity:    Worry: Not on file    Inability: Not on file  . Transportation needs:    Medical: Not on file    Non-medical: Not on file  Tobacco Use  . Smoking status: Never Smoker  . Smokeless tobacco: Never Used  Substance and Sexual Activity  . Alcohol use: Yes    Comment: weekly  . Drug use: No  . Sexual activity: Yes    Birth control/protection: Pill  Lifestyle  . Physical activity:    Days per week: Not on file    Minutes per session: Not on file  . Stress: Not on file  Relationships  . Social connections:    Talks  on phone: Not on file    Gets together: Not on file    Attends religious service: Not on file    Active member of club or organization: Not on file    Attends meetings of clubs or organizations: Not on file    Relationship status: Not on file  Other Topics Concern  . Not on file  Social History Narrative  . Not on file   Depression screen [facility] 2/9 04/20/2018 08/18/2017 03/24/2017 02/14/2017 02/07/2017  Decreased Interest 1 0 0 0 0  Down, Depressed, Hopeless 0 0 0 0 0  PHQ - 2 Score 1 0 0 0 0  Altered sleeping 2 - - - -  Tired, decreased energy 0 - - - -  Change in appetite 0 - - - -  Feeling bad or failure about yourself  1 - - - -  Trouble concentrating 1 - - - -  Moving slowly or fidgety/restless 0 - - - -  Suicidal thoughts 0 - - - -  PHQ-9 Score 5 - - - -  Difficult doing work/chores Not difficult at all - - - -     Review of Systems  Constitutional: Positive for fatigue. Negative for appetite change, chills, diaphoresis, fever and unexpected weight change (very dedicated to tlc for modest loss weight benefit).  Eyes: Negative for visual disturbance.  Respiratory: Negative for cough and shortness of breath.   Cardiovascular: Negative for chest pain, palpitations and leg swelling.  Genitourinary: Negative for decreased urine volume.  Neurological: Negative for syncope and headaches.  Hematological: Does not bruise/bleed easily.       Objective:   Physical Exam  Constitutional: She is oriented to person, place, and time. She appears well-developed and well-nourished. No distress.  HENT:  Head: Normocephalic and atraumatic.  Right Ear: External ear normal.  Left Ear: External ear normal.  Eyes: Conjunctivae are normal. No scleral icterus.  Neck: Normal range of motion. Neck supple. No thyromegaly present.  Cardiovascular: Normal rate, regular rhythm, normal heart sounds and intact distal pulses.  Pulmonary/Chest: Effort normal and breath sounds normal. No respiratory distress.   Musculoskeletal: She exhibits no edema.  Lymphadenopathy:    She has no cervical adenopathy.  Neurological: She is alert and oriented to person, place, and time.  Skin: Skin is warm and dry. She is not diaphoretic. No erythema.  Psychiatric: She has a normal mood and affect. Her behavior is normal.      BP 134/78 (BP Location: Left Arm, Patient Position:  Sitting, Cuff Size: Large)   Pulse 98   Temp 98.8 F (37.1 C) (Oral)   Resp 18   Ht 5' 8.62" (1.743 m)   Wt 250 lb 9.6 oz (113.7 kg)   LMP 04/02/2018 (Approximate)   SpO2 98%   BMI 37.42 kg/m      Assessment & Plan:   1. Dyslipidemia - on simvastatin 40 - not fasting today but she will come back for a lab only visit for this.  2. Generalized anxiety disorder - start trial of buspar for anxiety and doxepin qhs for insomnia.  3. PSVT (paroxysmal supraventricular tachycardia) (HCC)     Orders Placed This Encounter  Procedures  . Lipid panel    Standing Status:   Future    Number of Occurrences:   1    Standing Expiration Date:   04/21/2019    Order Specific Question:   Has the patient fasted?    Answer:   Yes  . CBC with Differential/Platelet    Standing Status:   Future    Number of Occurrences:   1    Standing Expiration Date:   04/21/2019  . Comprehensive metabolic panel    Standing Status:   Future    Number of Occurrences:   1    Standing Expiration Date:   04/21/2019    Order Specific Question:   Has the patient fasted?    Answer:   Yes  . VITAMIN D 25 Hydroxy (Vit-D Deficiency, Fractures)    Standing Status:   Future    Number of Occurrences:   1    Standing Expiration Date:   04/21/2019    Meds ordered this encounter  Medications  . busPIRone (BUSPAR) 15 MG tablet    Sig: Take 0.5 tablets (7.5 mg total) by mouth 2 (two) times daily. X 1 wk, then 1 tab po bid    Dispense:  60 tablet    Refill:  2  . doxepin (SINEQUAN) 10 MG capsule    Sig: Take 1 capsule (10 mg total) by mouth at bedtime.    Dispense:  30  capsule    Refill:  2    Norberto Sorenson, M.D.  Primary Care at [facility] [address] [place], [place] [address] [phone] phone [phone] fax  05/12/18 10:23 PM

## 2018-04-20 NOTE — Patient Instructions (Addendum)
   IF you received an x-ray today, you will receive an invoice from [facility]. Please contact [facility] at [phone] with questions or concerns regarding your invoice.   IF you received labwork today, you will receive an invoice from [facility]. Please contact [facility] at [phone] with questions or concerns regarding your invoice.   Our billing staff will not be able to assist you with questions regarding bills from these companies.  You will be contacted with the lab results as soon as they are available. The fastest way to get your results is to activate your My Chart account. Instructions are located on the last page of this paperwork. If you have not heard from us regarding the results in 2 weeks, please contact this office.     Living With Anxiety After being diagnosed with an anxiety disorder, you may be relieved to know why you have felt or behaved a certain way. It is natural to also feel overwhelmed about the treatment ahead and what it will mean for your life. With care and support, you can manage this condition and recover from it. How to cope with anxiety Dealing with stress Stress is your body's reaction to life changes and events, both good and bad. Stress can last just a few hours or it can be ongoing. Stress can play a major role in anxiety, so it is important to learn both how to cope with stress and how to think about it differently. Talk with your health care provider or a counselor to learn more about stress reduction. He or she may suggest some stress reduction techniques, such as:  Music therapy. This can include creating or listening to music that you enjoy and that inspires you.  Mindfulness-based meditation. This involves being aware of your normal breaths, rather than trying to control your breathing. It can be done while sitting or walking.  Centering prayer. This is a kind of meditation that involves focusing on a word, phrase, or  sacred image that is meaningful to you and that brings you peace.  Deep breathing. To do this, expand your stomach and inhale slowly through your nose. Hold your breath for 3-5 seconds. Then exhale slowly, allowing your stomach muscles to relax.  Self-talk. This is a skill where you identify thought patterns that lead to anxiety reactions and correct those thoughts.  Muscle relaxation. This involves tensing muscles then relaxing them.  Choose a stress reduction technique that fits your lifestyle and personality. Stress reduction techniques take time and practice. Set aside 5-15 minutes a day to do them. Therapists can offer training in these techniques. The training may be covered by some insurance plans. Other things you can do to manage stress include:  Keeping a stress diary. This can help you learn what triggers your stress and ways to control your response.  Thinking about how you respond to certain situations. You may not be able to control everything, but you can control your reaction.  Making time for activities that help you relax, and not feeling guilty about spending your time in this way.  Therapy combined with coping and stress-reduction skills provides the best chance for successful treatment. Medicines Medicines can help ease symptoms. Medicines for anxiety include:  Anti-anxiety drugs.  Antidepressants.  Beta-blockers.  Medicines may be used as the main treatment for anxiety disorder, along with therapy, or if other treatments are not working. Medicines should be prescribed by a health care provider. Relationships Relationships can play a big part in   helping you recover. Try to spend more time connecting with trusted friends and family members. Consider going to couples counseling, taking family education classes, or going to family therapy. Therapy can help you and others better understand the condition. How to recognize changes in your condition Everyone has a  different response to treatment for anxiety. Recovery from anxiety happens when symptoms decrease and stop interfering with your daily activities at home or work. This may mean that you will start to:  Have better concentration and focus.  Sleep better.  Be less irritable.  Have more energy.  Have improved memory.  It is important to recognize when your condition is getting worse. Contact your health care provider if your symptoms interfere with home or work and you do not feel like your condition is improving. Where to find help and support: You can get help and support from these sources:  Self-help groups.  Online and community organizations.  A trusted spiritual leader.  Couples counseling.  Family education classes.  Family therapy.  Follow these instructions at home:  Eat a healthy diet that includes plenty of vegetables, fruits, whole grains, low-fat dairy products, and lean protein. Do not eat a lot of foods that are high in solid fats, added sugars, or salt.  Exercise. Most adults should do the following: ? Exercise for at least 150 minutes each week. The exercise should increase your heart rate and make you sweat (moderate-intensity exercise). ? Strengthening exercises at least twice a week.  Cut down on caffeine, tobacco, alcohol, and other potentially harmful substances.  Get the right amount and quality of sleep. Most adults need 7-9 hours of sleep each night.  Make choices that simplify your life.  Take over-the-counter and prescription medicines only as told by your health care provider.  Avoid caffeine, alcohol, and certain over-the-counter cold medicines. These may make you feel worse. Ask your pharmacist which medicines to avoid.  Keep all follow-up visits as told by your health care provider. This is important. Questions to ask your health care provider  Would I benefit from therapy?  How often should I follow up with a health care  provider?  How long do I need to take medicine?  Are there any long-term side effects of my medicine?  Are there any alternatives to taking medicine? Contact a health care provider if:  You have a hard time staying focused or finishing daily tasks.  You spend many hours a day feeling worried about everyday life.  You become exhausted by worry.  You start to have headaches, feel tense, or have nausea.  You urinate more than normal.  You have diarrhea. Get help right away if:  You have a racing heart and shortness of breath.  You have thoughts of hurting yourself or others. If you ever feel like you may hurt yourself or others, or have thoughts about taking your own life, get help right away. You can go to your nearest emergency department or call:  Your local emergency services (911 in the [place]  A suicide crisis helpline, such as the National Suicide Prevention Lifeline at 1-800-273-8255. This is open 24-hours a day.  Summary  Taking steps to deal with stress can help calm you.  Medicines cannot cure anxiety disorders, but they can help ease symptoms.  Family, friends, and partners can play a big part in helping you recover from an anxiety disorder. This information is not intended to replace advice given to you by your health care provider. Make   sure you discuss any questions you have with your health care provider. Document Released: 11/23/2016 Document Revised: 11/23/2016 Document Reviewed: 11/23/2016 Elsevier Interactive Patient Education  2018 Elsevier Inc.  

## 2018-04-25 ENCOUNTER — Telehealth: Payer: Self-pay | Admitting: Family Medicine

## 2018-04-25 NOTE — Telephone Encounter (Signed)
Copied from CRM [id number]. Topic: Quick Communication - See Telephone Encounter >> Apr 25, 2018  4:34 PM Landry Mellow wrote: CRM for notification. See Telephone encounter for: 04/25/18.  Pt says that doxepin (SINEQUAN) 10 MG capsule is not helping at all. Pt is wanting to know what to do next.  Cb is [phone] [place] to leave detailed message

## 2018-04-26 NOTE — Telephone Encounter (Signed)
Can ramp it up quickly - I just didn't want to drug her but take 2 tonight. If it doesn't work, take 3 tomorrow night, etc. If she gets up to 5 tabs/night and still not working, let me know as I will send in a different/more potent med (though we use doxepin in doses up to  for depression/anxiety so she doesn't need to worry about accidentally ODing when trying 5 capsules a night even though that feels weird.  Let me know no matter what happens - if she finds a higher dose that works, I can send her in a  or  capsule.  If the higher dose works but makes her to groggy for to long OR doesn't keep her asleep long enough or whatever -> let me know. Plenty of other things to try but might as well first ramp up the doxepin since she has a full bottle at home and she's on a microscopic dose.

## 2018-04-26 NOTE — Telephone Encounter (Signed)
Pt advised on instructions.  

## 2018-04-26 NOTE — Telephone Encounter (Signed)
PEC message re: Sinequan not working sent to Dr. Clelia Croft

## 2018-05-04 NOTE — Telephone Encounter (Signed)
Patient would like a call back from [facility] or [facility] because the medication is not helping her sleep.

## 2018-05-05 ENCOUNTER — Ambulatory Visit (INDEPENDENT_AMBULATORY_CARE_PROVIDER_SITE_OTHER): Payer: BLUE CROSS/BLUE SHIELD | Admitting: Physician Assistant

## 2018-05-05 DIAGNOSIS — F411 Generalized anxiety disorder: Secondary | ICD-10-CM | POA: Diagnosis not present

## 2018-05-05 DIAGNOSIS — I471 Supraventricular tachycardia: Secondary | ICD-10-CM

## 2018-05-05 DIAGNOSIS — E785 Hyperlipidemia, unspecified: Secondary | ICD-10-CM

## 2018-05-05 MED ORDER — TRAZODONE HCL 50 MG PO TABS: 50.0000 mg | ORAL_TABLET | Freq: Every evening | ORAL | 1 refills | Status: DC | PRN

## 2018-05-05 NOTE — Telephone Encounter (Signed)
Left detailed vm °

## 2018-05-05 NOTE — Telephone Encounter (Signed)
Sent in trazodone instead - remember to take meds 2 hrs before bed on an empty stomach

## 2018-05-05 NOTE — Progress Notes (Signed)
Fast tract only. I did not see this pt.

## 2018-05-05 NOTE — Telephone Encounter (Signed)
Please advise 

## 2018-05-06 LAB — COMPREHENSIVE METABOLIC PANEL
ALT: 10 IU/L (ref 0–32)
AST: 11 IU/L (ref 0–40)
Albumin/Globulin Ratio: 1.6 (ref 1.2–2.2)
Albumin: 4.1 g/dL (ref 3.5–5.5)
Alkaline Phosphatase: 46 IU/L (ref 39–117)
BILIRUBIN TOTAL: 0.2 mg/dL (ref 0.0–1.2)
BUN / CREAT RATIO: 22 (ref 9–23)
BUN: 13 mg/dL (ref 6–20)
CHLORIDE: 102 mmol/L (ref 96–106)
CO2: 20 mmol/L (ref 20–29)
Calcium: 9.3 mg/dL (ref 8.7–10.2)
Creatinine, Ser: 0.6 mg/dL (ref 0.57–1.00)
GFR, EST AFRICAN AMERICAN: 141 mL/min/{1.73_m2} (ref >59)
GFR, EST NON AFRICAN AMERICAN: 123 mL/min/{1.73_m2} (ref >59)
GLOBULIN, TOTAL: 2.6 g/dL (ref 1.5–4.5)
Glucose: 89 mg/dL (ref 65–99)
POTASSIUM: 4.8 mmol/L (ref 3.5–5.2)
SODIUM: 138 mmol/L (ref 134–144)
TOTAL PROTEIN: 6.7 g/dL (ref 6.0–8.5)

## 2018-05-06 LAB — VITAMIN D 25 HYDROXY (VIT D DEFICIENCY, FRACTURES): VIT D 25 HYDROXY: 39.6 ng/mL (ref 30.0–100.0)

## 2018-05-06 LAB — CBC WITH DIFFERENTIAL/PLATELET
BASOS ABS: 0 10*3/uL (ref 0.0–0.2)
BASOS: 0 %
EOS (ABSOLUTE): 0.2 10*3/uL (ref 0.0–0.4)
Eos: 2 %
Hematocrit: 42 % (ref 34.0–46.6)
Hemoglobin: 13.9 g/dL (ref 11.1–15.9)
Immature Grans (Abs): 0 10*3/uL (ref 0.0–0.1)
Immature Granulocytes: 0 %
LYMPHS ABS: 2.2 10*3/uL (ref 0.7–3.1)
LYMPHS: 32 %
MCH: 29.2 pg (ref 26.6–33.0)
MCHC: 33.1 g/dL (ref 31.5–35.7)
MCV: 88 fL (ref 79–97)
MONOS ABS: 0.3 10*3/uL (ref 0.1–0.9)
Monocytes: 5 %
NEUTROS ABS: 4.2 10*3/uL (ref 1.4–7.0)
Neutrophils: 61 %
PLATELETS: 404 10*3/uL (ref 150–450)
RBC: 4.76 x10E6/uL (ref 3.77–5.28)
RDW: 14.2 % (ref 12.3–15.4)
WBC: 7 10*3/uL (ref 3.4–10.8)

## 2018-05-06 LAB — LIPID PANEL
Chol/HDL Ratio: 3.8 ratio (ref 0.0–4.4)
Cholesterol, Total: 214 mg/dL — ABNORMAL HIGH (ref 100–199)
HDL: 56 mg/dL
LDL Calculated: 124 mg/dL — ABNORMAL HIGH (ref 0–99)
Triglycerides: 168 mg/dL — ABNORMAL HIGH (ref 0–149)
VLDL Cholesterol Cal: 34 mg/dL (ref 5–40)

## 2018-05-12 ENCOUNTER — Other Ambulatory Visit: Payer: Self-pay | Admitting: Family Medicine

## 2018-05-31 ENCOUNTER — Other Ambulatory Visit: Payer: Self-pay | Admitting: Cardiology

## 2018-05-31 ENCOUNTER — Other Ambulatory Visit: Payer: Self-pay | Admitting: Family Medicine

## 2018-05-31 NOTE — Telephone Encounter (Signed)
Rx request sent to pharmacy.  

## 2018-06-01 NOTE — Telephone Encounter (Signed)
Refill of Trazodone  LOV 04/20/18  Dr[facility] Cle[facility]Shaw  [facility]  #60  1 refill  [id number] - [place], [place] - [address]

## 2018-06-03 NOTE — Telephone Encounter (Signed)
Refill req Trazodone sent to Dr. Clelia CroftShaw

## 2018-06-28 ENCOUNTER — Ambulatory Visit: Payer: BLUE CROSS/BLUE SHIELD | Admitting: Family Medicine

## 2018-06-28 ENCOUNTER — Encounter: Payer: Self-pay | Admitting: Family Medicine

## 2018-06-28 VITALS — BP 124/82 | HR 91 | Temp 99.4°F | Resp 17 | Ht 69.0 in | Wt 260.0 lb

## 2018-06-28 DIAGNOSIS — F331 Major depressive disorder, recurrent, moderate: Secondary | ICD-10-CM | POA: Diagnosis not present

## 2018-06-28 DIAGNOSIS — Z6838 Body mass index (BMI) 38.0-38.9, adult: Secondary | ICD-10-CM | POA: Diagnosis not present

## 2018-06-28 DIAGNOSIS — E785 Hyperlipidemia, unspecified: Secondary | ICD-10-CM

## 2018-06-28 DIAGNOSIS — R87612 Low grade squamous intraepithelial lesion on cytologic smear of cervix (LGSIL): Secondary | ICD-10-CM

## 2018-06-28 DIAGNOSIS — F5104 Psychophysiologic insomnia: Secondary | ICD-10-CM

## 2018-06-28 DIAGNOSIS — F411 Generalized anxiety disorder: Secondary | ICD-10-CM | POA: Diagnosis not present

## 2018-06-28 MED ORDER — CITALOPRAM HYDROBROMIDE 20 MG PO TABS: ORAL_TABLET | ORAL | 2 refills | Status: DC

## 2018-06-28 MED ORDER — BUPROPION HCL ER (SR) 150 MG PO TB12: ORAL_TABLET | ORAL | 2 refills | Status: DC

## 2018-06-28 NOTE — Progress Notes (Signed)
Subjective:  By signing my name below, I, Moises Blood, attest that this documentation has been prepared under the direction and in the presence of Delman Cheadle, MD. Electronically Signed: Moises Blood, [place]. 06/28/2018 , 5:40 PM .  Patient was seen in Room 1 .   Patient ID: Rose Cortez, female    DOB: [birth year], 32 y.o.   MRN: [id number] Chief Complaint  Patient presents with  . Follow-up    medication check   HPI Rose Cortez is a 32 y.o. female who presents to Primary Care at [facility] for follow up of medication. Patient was last seen on May 9th.   She will return to work in 3 weeks, when school starts.   Weight At last visit, she started a [employer]. But, she's gained 10 lbs, even though she's exercising 5-6 days a week. She also informs weight training 2-3x a week, water fitness, cardio step class, Zumba- along with a variety of other activities. She really enjoys exercising, though still has a cloud of depression looming over her.   Wt Readings from Last 3 Encounters:  06/28/18 260 lb (117.9 kg)  04/20/18 250 lb 9.6 oz (113.7 kg)  10/14/17 270 lb 8 oz (122.7 kg)   She denies eating fast foods, or gorging herself. She has a small treat about once-twice a week. She's cooking her own meals. Her diet has been about the same. With being on summer break, she hasn't been waking up in the morning with a sense of purpose for the past 4 weeks, so she's been sleeping in more, missing breakfast. On top of that, she's been snacking late at night. She hasn't met with a nutritionist before.   Anxiety and sleep She informs Buspar isn't working well for her; currently on 1 tablet (15 mg) bid. She still has spikes of depression, feeling down and has been a constant battle with negative chatter in her head. Additionally, without the improvement and weight loss with her strong effort, it has negatively impacted her mood and worsened her anxiety/depression. She feels like she'll have benefits with  meeting with a therapist.   She does note improvement in her sleep being on trazodone 50 mg qhs. She states it helps her fall asleep, and has been waking up after 5-6 hours.   Her periods have been regular, about 30 days apart. With her birth control, her periods lasts between 5-6 days.   Past Medical History:  Diagnosis Date  . Anxiety   . Depression   . History of PSVT (paroxysmal supraventricular tachycardia)    managed with Beta Blockers & Vagal Maneuvers.  . Hyperlipidemia    Past Surgical History:  Procedure Laterality Date  . TRANSTHORACIC ECHOCARDIOGRAM  03/2017   Normal EF 55-60%. GR 1 DD. Normal Valves - no MVP   Prior to Admission medications   Medication Sig Start Date End Date Taking? Authorizing Provider  busPIRone (BUSPAR) 15 MG tablet TAKE 1/2 TABLET BY MOUTH 2 TIMES DAILY FOR 1 WEEK, THEN 1 TABLET TWICE A DAY THEREAFTER 05/16/18   Shawnee Knapp, MD  cholecalciferol (VITAMIN D) 1000 UNITS tablet Take 5,000 Units by mouth daily.    [provider]  Fish Oil OIL by Does not apply route.    [provider]  metoprolol succinate (TOPROL-XL) 50 MG 24 hr tablet TAKE 1 TABLET BY MOUTH EVERYDAY AT BEDTIME 05/31/18   Leonie Man, MD  metoprolol tartrate (LOPRESSOR) 50 MG tablet Take 50 mg by mouth daily.  [provider]  norgestimate-ethinyl estradiol (ORTHO-CYCLEN,SPRINTEC,PREVIFEM) 0.25-35 MG-MCG tablet Take 1 tablet by mouth daily. 08/18/17   Shawnee Knapp, MD  simvastatin (ZOCOR) 40 MG tablet Take 1 tablet (40 mg total) by mouth daily. 09/16/17   Shawnee Knapp, MD  traZODone (DESYREL) 50 MG tablet TAKE 1-2 TABLETS (50-100 MG TOTAL) BY MOUTH AT BEDTIME AS NEEDED FOR SLEEP. D/C DOXEPIN 06/09/18   Shawnee Knapp, MD   No Known Allergies Family History  Problem Relation Age of Onset  . Hyperlipidemia Mother   . Breast cancer Mother   . Hyperlipidemia Father   . Depression Sister   . Heart disease Maternal Grandfather   . Lung cancer Paternal Grandfather     Social History   Socioeconomic History  . Marital status: Single    Spouse name: Not on file  . Number of children: 0  . Years of education: Not on file  . Highest education level: Not on file  Occupational History  . Not on file  Social Needs  . Financial resource strain: Not on file  . Food insecurity:    Worry: Not on file    Inability: Not on file  . Transportation needs:    Medical: Not on file    Non-medical: Not on file  Tobacco Use  . Smoking status: Never Smoker  . Smokeless tobacco: Never Used  Substance and Sexual Activity  . Alcohol use: Yes    Comment: weekly  . Drug use: No  . Sexual activity: Yes    Birth control/protection: Pill  Lifestyle  . Physical activity:    Days per week: Not on file    Minutes per session: Not on file  . Stress: Not on file  Relationships  . Social connections:    Talks on phone: Not on file    Gets together: Not on file    Attends religious service: Not on file    Active member of club or organization: Not on file    Attends meetings of clubs or organizations: Not on file    Relationship status: Not on file  Other Topics Concern  . Not on file  Social History Narrative  . Not on file   Depression screen [facility] 2/9 06/28/2018 04/20/2018 08/18/2017 03/24/2017 02/14/2017  Decreased Interest 0 1 0 0 0  Down, Depressed, Hopeless 0 0 0 0 0  PHQ - 2 Score 0 1 0 0 0  Altered sleeping - 2 - - -  Tired, decreased energy - 0 - - -  Change in appetite - 0 - - -  Feeling bad or failure about yourself  - 1 - - -  Trouble concentrating - 1 - - -  Moving slowly or fidgety/restless - 0 - - -  Suicidal thoughts - 0 - - -  PHQ-9 Score - 5 - - -  Difficult doing work/chores - Not difficult at all - - -    Review of Systems  Constitutional: Negative for chills, fatigue, fever and unexpected weight change.  Respiratory: Negative for cough.   Gastrointestinal: Negative for constipation, diarrhea, nausea and vomiting.  Skin: Negative for  rash and wound.  Neurological: Negative for dizziness, weakness and headaches.  Psychiatric/Behavioral: Positive for dysphoric mood. The patient is nervous/anxious.        Objective:   Physical Exam  Constitutional: She is oriented to person, place, and time. She appears well-developed and well-nourished. No distress.  HENT:  Head: Normocephalic and atraumatic.  Eyes: Pupils are  equal, round, and reactive to light. EOM are normal.  Neck: Neck supple.  Cardiovascular: Normal rate.  Pulmonary/Chest: Effort normal. No respiratory distress.  Musculoskeletal: Normal range of motion.  Neurological: She is alert and oriented to person, place, and time.  Skin: Skin is warm and dry.  Psychiatric: She has a normal mood and affect. Her behavior is normal.  Nursing note and vitals reviewed.   BP 124/82   Pulse 91   Temp 99.4 F (37.4 C) (Oral)   Resp 17   Ht 5' 9"  (1.753 m)   Wt 260 lb (117.9 kg)   SpO2 98%   BMI 38.40 kg/m        Assessment & Plan:   1. Class 2 severe obesity due to excess calories with serious comorbidity and body mass index (BMI) of 38.0 to 38.9 in adult [facility]) - check w/ ins to see if can access nutrition services for little to no cost which is a barrier to pt.  Consider referral to [facility] as pt has not tolerated several weight loss meds well prior (phentermine, stimulants ineffective for weight and ADD sxs- adderall, vyvanse (tried those due to +FHx of ADD in primary members and pt w/ yrs of sxs that family/friends noticed) wellbutrin was ineffective but no negative effects so agrees to retry) Suspect depression has triggered weight gain and vice versa but pt is really eating quite healthy overall and getting excellent exercise so gain when she is working so hard is VERY depressing - reviewed importance of sleep - stop eating late, start breakfast Wt Readings from Last 3 Encounters:  06/28/18 260 lb (117.9 kg)  04/20/18 250 lb 9.6 oz (113.7 kg)    10/14/17 270 lb 8 oz (122.7 kg)     2. Dyslipidemia - on simvastatin 40 for >5 yrs so no dose change for now. Last checked 2 mos ago and LDL increased from 97->124 (though HDL increased 40-54 and trig decreased 248-168) but still total increased 187->214 w/ non-HDL 147-> 158 (did have pt stop simvastatin during prolonged sig EBV hepatitis - rechecked lipids before restarting 01/2017 and LDL 160 w/ HDL 12, trig 358, non-HDL 232 OFF of statin so restarted same dose once liver fully recovered.)  3. Moderate episode of recurrent major depressive disorder ([place]) - doing MUCH worse since starting buspar so will d/c immed - can stop but if any w/d sxs then restart at 1/2 tab bid x 3d, then 1/2 tab qhs x 3d then stop.  Did well on citalopram prior initially (but lost effect over the yrs so stopped) so will retry that - start 23m today and wean up to 20 in 1-2 wks. RTC immed if worsening. Recheck in 4-6 wks as would def be expecting improvement at that time though aware not yet at full effect. Could also be OCPs worsening sxs and as pt is not sexually active with no plans to change that immed she will stop OCPs for now. (severe w/d sxs to paxil and has had similar sxs when starting buspar), also failed zoloft 15 yrs ago. Strongly supportive of pt's decision to seek therapy -recommendations given in AVS  4. GAD (generalized anxiety disorder) - was on low dose prn alprazolam sev yrs prior (2015-2016) but that was when she developed more "add" like sxs which maybe med side effect??  Very adverse mood reaction to buspar so stop. Restart citalopram and watch for worsening anxiety from restarting wellbutrin but did not have prob w/ this prior (  just didn't do anything). Did have pt on propranolol ER 80 qd but stopped 01/2017 due to cost of medication (to suppress sxs of PSVT) Has NOT tried prn HYDROXYZINE or IR PROPRANOLOL per epic so could cons if sxs worsen  5. Psychophysiological insomnia - failed doxepin - try  increasing trazodone dose as needed to get effect.  6.      LGSIL on Pap smear of cervix - done 08/18/17 - had colpo w/ bx and ECC by Dr. Hale Bogus on 10/14/17 which showed no abnml cells so rec to have 1 yr repeat pap WITH HR HPV  - sched for appt for this w/ Dr. Sabra Heck on 10/20/2018  Vit D was low nml at 39 - cont supplement w/ 1Ku/d  Meds ordered this encounter  Medications  . buPROPion (WELLBUTRIN SR) 150 MG 12 hr tablet    Sig: Take 1 tabs po qam x 2 weeks, the 2 tabs po qd    Dispense:  60 tablet    Refill:  2  . citalopram (CELEXA) 20 MG tablet    Sig: Take 1/2 tab po qd x 1 wk, the 1 tab po qd    Dispense:  30 tablet    Refill:  2    I personally performed the services described in this documentation, which was scribed in my presence. The recorded information has been reviewed and considered, and addended by me as needed.   Delman Cheadle, M.D.  Primary Care at [facility] [address] [place], [place] [address] [phone] phone [phone] fax  07/04/18 3:55 PM

## 2018-06-28 NOTE — Patient Instructions (Addendum)
A local nutritionist who does a GREAT job is Scientist, research (medical)Rose Cortez at [employer]. She is very pro-food and helps you figure out how to enjoy the things you like most while cutting corners where you don't need so you don't have to diet but can live a healthy lifestyle for your body.    Check out her website: [email or web address] [employer] Luther ParodyMegan Cortez [address] [place], [place] [address]  [phone]  Rose MilletMegan & Rose RiegerLaura (the two nutritionists at [employer]) are both [employer] credentialed providers, many [employer] clients can receive nutrition services from [employer] at no cost to the client.    IF you received an x-ray today, you will receive an invoice from [facility]. Please contact [facility] at [phone] with questions or concerns regarding your invoice.   IF you received labwork today, you will receive an invoice from [facility]. Please contact [facility] at [phone] with questions or concerns regarding your invoice.   Our billing staff will not be able to assist you with questions regarding bills from these companies.  You will be contacted with the lab results as soon as they are available. The fastest way to get your results is to activate your My Chart account. Instructions are located on the last page of this paperwork. If you have not heard from us regarding the results in 2 weeks, please contact this office.     You can call the 24-hour [facility] HelpLine at [phone] or [phone] for immediate assistance. Among several different types of services, they offer an Intensive oupatient program for mood disorders - which is a group type setting Monday-Friday 9-noon.  You can schedule an assessment by calling the above numbers during which the costs for the program and insurance benefits will be reviewed.    No psychological or psychiatric services take physician  referrals - they always want the patient to call. Some excellent private psychiatrists for individual counseling are:  [facility] [address], [place], [place] [address]  Phone: [phone]  [facility] [address] [place], [place] [address] [phone]   [facility]  Others are:   [facility] [address]  [place], [place] [address] Phone: [phone]  [facility] [address] [place], [place] [address] [phone]  [facility] [address], [place], [place] [address]  [phone]  [facility] [address] [place], [place] Phone: [phone]   [facility]  [address], [place], [place] [address]  [phone]

## 2018-07-02 ENCOUNTER — Other Ambulatory Visit: Payer: Self-pay | Admitting: Family Medicine

## 2018-07-03 ENCOUNTER — Ambulatory Visit: Payer: BLUE CROSS/BLUE SHIELD | Admitting: Family Medicine

## 2018-07-03 NOTE — Telephone Encounter (Signed)
Refill? Last seen 06/28/2018

## 2018-07-05 ENCOUNTER — Encounter: Payer: Self-pay | Admitting: Family Medicine

## 2018-07-05 DIAGNOSIS — F5104 Psychophysiologic insomnia: Secondary | ICD-10-CM | POA: Insufficient documentation

## 2018-07-18 DIAGNOSIS — F411 Generalized anxiety disorder: Secondary | ICD-10-CM | POA: Diagnosis not present

## 2018-07-20 ENCOUNTER — Other Ambulatory Visit: Payer: Self-pay | Admitting: Family Medicine

## 2018-08-01 DIAGNOSIS — F411 Generalized anxiety disorder: Secondary | ICD-10-CM | POA: Diagnosis not present

## 2018-08-15 DIAGNOSIS — F411 Generalized anxiety disorder: Secondary | ICD-10-CM | POA: Diagnosis not present

## 2018-08-20 ENCOUNTER — Other Ambulatory Visit: Payer: Self-pay | Admitting: Family Medicine

## 2018-08-25 ENCOUNTER — Telehealth: Payer: Self-pay | Admitting: Family Medicine

## 2018-08-25 NOTE — Telephone Encounter (Signed)
Called and left patient a VM advising of Dr. Leandro ReasonerShaws leave for September and October. I let her know that we could either schedule her with a different provider or she could wait until November to see Dr. Clelia CroftShaw.   When patient calls back, please reschedule her for an  PHY: CPE  Thank you!

## 2018-08-29 ENCOUNTER — Telehealth: Payer: Self-pay

## 2018-08-29 ENCOUNTER — Other Ambulatory Visit: Payer: Self-pay

## 2018-08-29 ENCOUNTER — Ambulatory Visit: Payer: Self-pay | Admitting: Family Medicine

## 2018-08-29 ENCOUNTER — Ambulatory Visit (HOSPITAL_COMMUNITY)
Admission: EM | Admit: 2018-08-29 | Discharge: 2018-08-29 | Disposition: A | Discharge status: Home / Self Care | Payer: BLUE CROSS/BLUE SHIELD | Attending: Family Medicine | Admitting: Family Medicine

## 2018-08-29 ENCOUNTER — Encounter (HOSPITAL_COMMUNITY): Payer: Self-pay

## 2018-08-29 DIAGNOSIS — K625 Hemorrhage of anus and rectum: Secondary | ICD-10-CM

## 2018-08-29 DIAGNOSIS — R197 Diarrhea, unspecified: Secondary | ICD-10-CM

## 2018-08-29 LAB — OCCULT BLOOD, POC DEVICE: FECAL OCCULT BLD: POSITIVE — AB

## 2018-08-29 MED ORDER — HYDROCORTISONE 2.5 % RE CREA: 1.0000 "application " | TOPICAL_CREAM | Freq: Two times a day (BID) | RECTAL | 0 refills | Status: DC

## 2018-08-29 NOTE — ED Triage Notes (Signed)
Pt states she has rectal bleeding x 5 days.

## 2018-08-29 NOTE — Telephone Encounter (Signed)
Left message for pt. To call back. She was on hold for triage for rectal bleeding for 3 weeks.

## 2018-08-29 NOTE — Discharge Instructions (Addendum)
It was nice meeting you!!  You did have some blood in your stool.  I didn't see any obvious external hemorrhoid.  You could have an internal hemorrhoid or a small tear causing the bleeding.  The itching and itching and burning could be from the loose stools irritating the rectum I don't see anything concerning right now but if this continues would follow up with Gastroenterologist for further work up.  I will give you some cream to see if this helps.

## 2018-08-29 NOTE — Telephone Encounter (Signed)
She called in c/o having bright red blood in the toilet water that she thinks is coming from her rectum.   I see it when I pee but mostly when I have a BM.   She is having diarrhea too.   She first noticed blood in the toilet a couple of months ago but it went away so she didn't do anything about it.   Over the last 5 days the bleeding is happening more.   Including having a small amount of blood in her underwear.   Denies passing blood clots.   Not on blood thinners.   I have referred her to the ED however she asked if she could go an urgent care instead.   I let her know that would be fine.   "The emergency room is so busy I know I would be left sitting for hours with what I have going on".  [facility] and gave her directions how to get there. She was agreeable to this plan.  Her PCP Dr. Norberto SorensonEva Shaw is on leave until 10/17/18 so no note routed as a result. Reason for Disposition . [1] MODERATE rectal bleeding (small blood clots, passing blood without stool, or toilet water turns red) AND [2] more than once a day  Answer Assessment - Initial Assessment Questions 1. APPEARANCE of BLOOD: "What color is it?" "Is it passed separately, on the surface of the stool, or mixed in with the stool?"      I'm having blood going into the toilet.  It happens when I pee and have BM.   I noticed it more with having a BM.    2. AMOUNT: "How much blood was passed?"      Turns water red 3. FREQUENCY: "How many times has blood been passed with the stools?"      A couple of months.   Now over the last 5 days it's been happening daily.    4. ONSET: "When was the blood first seen in the stools?" (Days or weeks)      A couple of months ago it happened.   I having itching at the rectal area. 5. DIARRHEA: "Is there also some diarrhea?" If so, ask: "How many diarrhea stools were passed in past 24 hours?"      I'm having diarrhea.   Bright red blood in the toilet.   I thought it was from a  medication I was on.   I started a new Zoloft and Wellbutrin.    6. CONSTIPATION: "Do you have constipation?" If so, "How bad is it?"     No 7. RECURRENT SYMPTOMS: "Have you had blood in your stools before?" If so, ask: "When was the last time?" and "What happened that time?"      Yes.    But it happened so infrequently I didn't do anything about. It. 8. BLOOD THINNERS: "Do you take any blood thinners?" (e.g., Coumadin/warfarin, Pradaxa/dabigatran, aspirin)     No 9. OTHER SYMPTOMS: "Do you have any other symptoms?"  (e.g., abdominal pain, vomiting, dizziness, fever)     No dizziness.   No other symptoms.   I'm still working and all. 10. PREGNANCY: "Is there any chance you are pregnant?" "When was your last menstrual period?"       No.   2 weeks ago.  Protocols used: RECTAL BLEEDING-A-AH

## 2018-08-30 NOTE — ED Provider Notes (Signed)
[facility]    CSN: [id number] Arrival date & time: 08/29/18  1536     History   Chief Complaint Chief Complaint  Patient presents with  . Rectal Bleeding    HPI Zyia Kaneko is a 32 y.o. female.   Patient is a 32 year old female that presents with 5 days of rectal bleeding, itching, burning.  She has also had some diarrhea.  Describes them as soft and watery at times.  The symptoms have been waxing and waning.  She describes it as bright red blood with a few drops in the toilet and mostly when she wipes.  She denies any associated abdominal pain, back pain, dark stools, constipation, nausea, vomiting, fevers.  She denies any recent traveling, sick contacts, antibiotic use.  She denies any history of Crohn's, UC, IBS.  She denies any history of hemorrhoids.  She has been using some cortisone cream over-the-counter for symptoms.  ROS per HPI    Rectal Bleeding    Past Medical History:  Diagnosis Date  . Anxiety   . Depression   . History of PSVT (paroxysmal supraventricular tachycardia)    managed with Beta Blockers & Vagal Maneuvers.  . Hyperlipidemia   . LGSIL on Pap smear of cervix 08/18/2017    Patient Active Problem List   Diagnosis Date Noted  . Psychophysiological insomnia 07/05/2018  . 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 PSVT (paroxysmal supraventricular tachycardia) (HCC) 03/18/2017  . Depression 08/20/2016  . GAD (generalized anxiety disorder) 04/14/2015  . Obesity 04/14/2015  . Dyslipidemia 04/14/2015    Past Surgical History:  Procedure Laterality Date  . TRANSTHORACIC ECHOCARDIOGRAM  03/2017   Normal EF 55-60%. GR 1 DD. Normal Valves - no MVP    OB History    Gravida  0   Para  0   Term  0   Preterm  0   AB  0   Living  0     SAB  0   TAB  0   Ectopic  0   Multiple  0   Live Births  0            Home Medications    Prior to Admission medications   Medication Sig Start Date End Date Taking? Authorizing  Provider  buPROPion [facility] SR) 150 MG 12 hr tablet Take 1 tabs po qam x 2 weeks, the 2 tabs po qd 06/28/18   Sherren Mocha, MD  cholecalciferol (VITAMIN D) 1000 UNITS tablet Take 1,000 Units by mouth daily.     [provider]  citalopram (CELEXA) 20 MG tablet Take 1/2 tab po qd x 1 wk, the 1 tab po qd 06/28/18   Sherren Mocha, MD  hydrocortisone (ANUSOL-HC) 2.5 % rectal cream Place 1 application rectally 2 (two) times daily. 08/29/18   Dahlia Byes A, NP  metoprolol succinate (TOPROL-XL) 50 MG 24 hr tablet TAKE 1 TABLET BY MOUTH EVERYDAY AT BEDTIME 05/31/18   Marykay Lex, MD  metoprolol tartrate (LOPRESSOR) 50 MG tablet Take 50 mg by mouth daily.    [provider]  simvastatin (ZOCOR) 40 MG tablet Take 1 tablet (40 mg total) by mouth daily. 09/16/17   Sherren Mocha, MD  traZODone (DESYREL) 50 MG tablet TAKE 1-2 TABLETS (50-100 MG TOTAL) BY MOUTH AT BEDTIME AS NEEDED FOR SLEEP. D/C DOXEPIN 07/04/18   Sherren Mocha, MD    Family History Family History  Problem Relation Age of Onset  . Hyperlipidemia  Mother   . Breast cancer Mother   . Hyperlipidemia Father   . Depression Sister   . Heart disease Maternal Grandfather   . Lung cancer Paternal Grandfather     Social History Social History   Tobacco Use  . Smoking status: Never Smoker  . Smokeless tobacco: Never Used  Substance Use Topics  . Alcohol use: Yes    Comment: weekly  . Drug use: No     Allergies   Patient has no known allergies.   Review of Systems Review of Systems  Gastrointestinal: Positive for hematochezia.     Physical Exam Triage Vital Signs ED Triage Vitals  Enc Vitals Group     BP 08/29/18 1628 136/85     Pulse Rate 08/29/18 1628 98     Resp 08/29/18 1628 18     Temp 08/29/18 1628 98.2 F (36.8 C)     Temp Source 08/29/18 1628 Oral     SpO2 08/29/18 1628 100 %     Weight 08/29/18 1630 260 lb (117.9 kg)     Height --      Head Circumference --      Peak Flow --      Pain Score  08/29/18 1630 0     Pain Loc --      Pain Edu? --      Excl. in GC? --    No data found.  Updated Vital Signs BP 136/85 (BP Location: Left Arm)   Pulse 98   Temp 98.2 F (36.8 C) (Oral)   Resp 18   Wt 260 lb (117.9 kg)   LMP 08/22/2018   SpO2 100%   BMI 38.40 kg/m   Visual Acuity Right Eye Distance:   Left Eye Distance:   Bilateral Distance:    Right Eye Near:   Left Eye Near:    Bilateral Near:     Physical Exam  Constitutional: She is oriented to person, place, and time. She appears well-developed and well-nourished.  Very pleasant. Non toxic or ill appearing.     HENT:  Head: Normocephalic and atraumatic.  Eyes: Conjunctivae are normal.  Neck: Normal range of motion.  Pulmonary/Chest: Effort normal.  Abdominal: Soft. Bowel sounds are normal. She exhibits no distension and no mass. There is no tenderness. There is no rebound and no guarding. No hernia.  Genitourinary:  Genitourinary Comments: No external hemorrhoid viewed. No obvious fissures or lesions. No swelling. Nothing palpated on internal exam. No pain with exam.   Musculoskeletal: Normal range of motion.  Neurological: She is alert and oriented to person, place, and time.  Skin: Skin is warm and dry.  Psychiatric: She has a normal mood and affect.  Nursing note and vitals reviewed.    UC Treatments / Results  Labs (all labs ordered are listed, but only abnormal results are displayed) Labs Reviewed  OCCULT BLOOD, POC DEVICE - Abnormal; Notable for the following components:      Result Value   Fecal Occult Bld POSITIVE (*)    All other components within normal limits    EKG None  Radiology No results found.  Procedures Procedures (including critical care time)  Medications Ordered in UC Medications - No data to display  Initial Impression / Assessment and Plan / UC Course  I have reviewed the triage vital signs and the nursing notes.  Pertinent labs & imaging results that were  available during my care of the patient were reviewed by me and considered in my medical  decision making (see chart for details).     Occult blood positive No external hemorrhoid on exam Nothing palpated internally No obvious bleeding, fissures, lesions.  Nothing concerning on physical exam and history The bleeding could be from irritation from the diarrhea. Not believed to be infectious Instructed patient to continue monitoring for worsening symptoms. If her symptoms continue she will need a GI follow-up and possible colonoscopy. Hydrocortisone cream prescribed for symptoms Final Clinical Impressions(s) / UC Diagnoses   Final diagnoses:  Rectal bleeding  Diarrhea, unspecified type     Discharge Instructions     It was nice meeting you!!  You did have some blood in your stool.  I didn't see any obvious external hemorrhoid.  You could have an internal hemorrhoid or a small tear causing the bleeding.  The itching and itching and burning could be from the loose stools irritating the rectum I don't see anything concerning right now but if this continues would follow up with Gastroenterologist for further work up.  I will give you some cream to see if this helps.     ED Prescriptions    Medication Sig Dispense Auth. Provider   hydrocortisone (ANUSOL-HC) 2.5 % rectal cream Place 1 application rectally 2 (two) times daily. 30 g Dahlia Byes A, NP     Controlled Substance Prescriptions [place] Controlled Substance Registry consulted? Not Applicable   Janace Aris, NP 08/30/18 1601

## 2018-08-31 DIAGNOSIS — F411 Generalized anxiety disorder: Secondary | ICD-10-CM | POA: Diagnosis not present

## 2018-09-11 DIAGNOSIS — F411 Generalized anxiety disorder: Secondary | ICD-10-CM | POA: Diagnosis not present

## 2018-09-18 ENCOUNTER — Encounter: Payer: Self-pay | Admitting: Gastroenterology

## 2018-09-22 ENCOUNTER — Other Ambulatory Visit: Payer: Self-pay | Admitting: Family Medicine

## 2018-09-23 ENCOUNTER — Telehealth: Payer: Self-pay | Admitting: Family Medicine

## 2018-09-25 NOTE — Telephone Encounter (Signed)
Requested medication (s) are due for refill today: Yes  Requested medication (s) are on the active medication list: Yes  Last refill:  09/16/17  Future visit scheduled: No  Notes to clinic:  Expired medication, unable to refill per protocol     Requested Prescriptions  Pending Prescriptions Disp Refills   simvastatin (ZOCOR) 40 MG tablet [Pharmacy Med Name: SIMVASTATIN 40 MG TABLET] 90 tablet 0    Sig: TAKE 1 TABLET BY MOUTH EVERY DAY     Cardiovascular:  Antilipid - Statins Failed - 09/25/2018  1:04 PM      Failed - Total Cholesterol in normal range and within 360 days    Cholesterol, Total  Date Value Ref Range Status  05/05/2018 214 (H) 100 - 199 mg/dL Final         Failed - LDL in normal range and within 360 days    LDL Calculated  Date Value Ref Range Status  05/05/2018 124 (H) 0 - 99 mg/dL Final         Failed - Triglycerides in normal range and within 360 days    Triglycerides  Date Value Ref Range Status  05/05/2018 168 (H) 0 - 149 mg/dL Final         Passed - HDL in normal range and within 360 days    HDL  Date Value Ref Range Status  05/05/2018 56 >39 mg/dL Final         Passed - Patient is not pregnant      Passed - Valid encounter within last 12 months    Recent Outpatient Visits          2 months ago Class 2 severe obesity due to excess calories with serious comorbidity and body mass index (BMI) of 38.0 to 38.9 in adult [facility])   [facility], Levell July, MD   4 months ago Dyslipidemia   [facility], Madelaine Bhat, PA-C   5 months ago Dyslipidemia   [facility], Levell July, MD   1 year ago Annual physical exam   [facility], Levell July, MD   1 year ago Gammaherpesviral mononucleosis with other complications   [facility], Levell July, MD

## 2018-09-27 ENCOUNTER — Other Ambulatory Visit: Payer: Self-pay | Admitting: Family Medicine

## 2018-09-28 NOTE — Telephone Encounter (Signed)
Pharmacy states they never received the script back for   simvastatin (ZOCOR) 40 MG tablet 90 tablet 1 09/27/2018    Sig: TAKE 1 TABLET BY MOUTH EVERY DAY   Sent to pharmacy as: simvastatin (ZOCOR) 40 MG tablet   E-Prescribing Status: Transmission to pharmacy failed (09/27/2018 11:40 AM EDT)   Message completed by Tiffany L. Pearson (09/27/2018 2:43 PM).

## 2018-09-28 NOTE — Telephone Encounter (Signed)
Call to pharmacy- verbal Rx given from Rx sent on 09/27/18.

## 2018-09-29 DIAGNOSIS — F411 Generalized anxiety disorder: Secondary | ICD-10-CM | POA: Diagnosis not present

## 2018-10-01 ENCOUNTER — Other Ambulatory Visit: Payer: Self-pay | Admitting: Family Medicine

## 2018-10-02 NOTE — Telephone Encounter (Signed)
Patient called and advised she will need a physical appointment scheduled, she verbalized understanding and asks for an appointment in late November after 3:30pm, no available appointments available with her preferences until Thursday, 11/23/18 at 1530, patient agreeable to that appointment. Patient says her anxiety/depression medications will be due for a refill before then and asks what does she do about refills, I advised they will be refilled when it is due until her appointment and call her pharmacy to request the refill, she verbalized understanding.

## 2018-10-03 ENCOUNTER — Encounter: Payer: Self-pay | Admitting: Gastroenterology

## 2018-10-03 ENCOUNTER — Ambulatory Visit: Payer: BLUE CROSS/BLUE SHIELD | Admitting: Gastroenterology

## 2018-10-03 ENCOUNTER — Encounter: Payer: BLUE CROSS/BLUE SHIELD | Admitting: Family Medicine

## 2018-10-03 VITALS — BP 120/82 | HR 88 | Ht 67.5 in | Wt 255.0 lb

## 2018-10-03 DIAGNOSIS — K6289 Other specified diseases of anus and rectum: Secondary | ICD-10-CM | POA: Diagnosis not present

## 2018-10-03 DIAGNOSIS — K625 Hemorrhage of anus and rectum: Secondary | ICD-10-CM

## 2018-10-03 DIAGNOSIS — K641 Second degree hemorrhoids: Secondary | ICD-10-CM | POA: Diagnosis not present

## 2018-10-03 DIAGNOSIS — K602 Anal fissure, unspecified: Secondary | ICD-10-CM

## 2018-10-03 DIAGNOSIS — K5900 Constipation, unspecified: Secondary | ICD-10-CM

## 2018-10-03 MED ORDER — AMBULATORY NON FORMULARY MEDICATION: 1 refills | Status: DC

## 2018-10-03 NOTE — Progress Notes (Signed)
Rose Cortez    [id number]    1986-06-13  Primary Care Physician:Shaw, Levell July, MD  Referring Physician: Sherren Mocha, MD [address] [place], [place] [address]  Chief complaint: Bright red blood per rectum, rectal discomfort  HPI: 32 year old female here for new patient visit.  She was seen at urgent care and was given hydrocortisone cream per rectum for hemorrhoids.  Since she started using the hydrocortisone cream she is no longer bleeding but continues to have rectal discomfort, burning sensation and also intermittent itching.  She was bleeding for about 5 days 2 weeks ago. Her bowel habits have changed, passing small short piece and also long thin piece.  She has significant discomfort during defecation. Denies any abdominal pain, nausea, vomiting, loss of appetite or weight loss. No family history of colon cancer    Outpatient Encounter Medications as of 10/03/2018  Medication Sig  . buPROPion (WELLBUTRIN SR) 150 MG 12 hr tablet TAKE 1 TABLET BY MOUTH EVERY MORNING X 2 WEEKS, THE 2 TABLETS BY MOUTH DAILY  . cholecalciferol (VITAMIN D) 1000 UNITS tablet Take 1,000 Units by mouth daily.   . citalopram (CELEXA) 20 MG tablet TAKE 1/2 TABLET BY MOUTH DAILY X 1 WEEK, THEN 1 TABLET BY MOUTH DAILY  . hydrocortisone (ANUSOL-HC) 2.5 % rectal cream Place 1 application rectally 2 (two) times daily.  . metoprolol succinate (TOPROL-XL) 50 MG 24 hr tablet TAKE 1 TABLET BY MOUTH EVERYDAY AT BEDTIME  . simvastatin (ZOCOR) 40 MG tablet TAKE 1 TABLET BY MOUTH EVERY DAY  . traZODone (DESYREL) 50 MG tablet TAKE 1-2 TABLETS (50-100 MG TOTAL) BY MOUTH AT BEDTIME AS NEEDED FOR SLEEP. D/C DOXEPIN  . [DISCONTINUED] metoprolol tartrate (LOPRESSOR) 50 MG tablet Take 50 mg by mouth daily.   No facility-administered encounter medications on file as of 10/03/2018.     Allergies as of 10/03/2018  . (No Known Allergies)    Past Medical History:  Diagnosis Date  . Anxiety   . Depression   .  History of PSVT (paroxysmal supraventricular tachycardia)    managed with Beta Blockers & Vagal Maneuvers.  Marland Kitchen HTN (hypertension)   . Hyperlipidemia   . 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 Mononucleosis 02/2017    Past Surgical History:  Procedure Laterality Date  . COLPOSCOPY  10/2017  . TRANSTHORACIC ECHOCARDIOGRAM  03/2017   Normal EF 55-60%. GR 1 DD. Normal Valves - no MVP  . WISDOM TOOTH EXTRACTION      Family History  Problem Relation Age of Onset  . Hyperlipidemia Mother   . Breast cancer Mother   . Hyperlipidemia Father   . Depression Sister   . Heart disease Maternal Grandfather   . Diabetes Maternal Grandfather   . Lung cancer Paternal Grandfather   . Heart attack Maternal Uncle     Social History   Socioeconomic History  . Marital status: Single    Spouse name: Not on file  . Number of children: 0  . Years of education: Not on file  . Highest education level: Not on file  Occupational History  . Occupation: Runner, broadcasting/film/video  Social Needs  . Financial resource strain: Not on file  . Food insecurity:    Worry: Not on file    Inability: Not on file  . Transportation needs:    Medical: Not on file    Non-medical: Not on file  Tobacco Use  . Smoking status: Never Smoker  .  Smokeless tobacco: Never Used  Substance and Sexual Activity  . Alcohol use: Yes    Comment: 1 per week  . Drug use: No  . Sexual activity: Yes    Birth control/protection: Pill  Lifestyle  . Physical activity:    Days per week: Not on file    Minutes per session: Not on file  . Stress: Not on file  Relationships  . Social connections:    Talks on phone: Not on file    Gets together: Not on file    Attends religious service: Not on file    Active member of club or organization: Not on file    Attends meetings of clubs or organizations: Not on file    Relationship status: Not on file  . Intimate partner violence:    Fear of current or ex partner: Not on file    Emotionally  abused: Not on file    Physically abused: Not on file    Forced sexual activity: Not on file  Other Topics Concern  . Not on file  Social History Narrative  . Not on file      Review of systems: Review of Systems  Constitutional: Negative for fever and chills.  Positive for fatigue HENT: Negative.   Eyes: Negative for blurred vision.  Respiratory: Negative for cough, shortness of breath and wheezing.   Cardiovascular: Negative for chest pain and palpitations.  Gastrointestinal: as per HPI Genitourinary: Negative for dysuria, urgency, frequency and hematuria.  Musculoskeletal: Negative for myalgias, back pain and joint pain.  Skin: Negative for itching and rash.  Neurological: Negative for dizziness, tremors, focal weakness, seizures and loss of consciousness.  Endo/Heme/Allergies: Positive for seasonal allergies.  Psychiatric/Behavioral: Negative for depression, suicidal ideas and hallucinations.  Positive for anxiety All other systems reviewed and are negative.   Physical Exam: Vitals:   10/03/18 1324  BP: 120/82  Pulse: 88   Body mass index is 39.35 kg/m. Gen:      No acute distress HEENT:  EOMI, sclera anicteric Neck:     No masses; no thyromegaly Lungs:    Clear to auscultation bilaterally; normal respiratory effort CV:         Regular rate and rhythm; no murmurs Abd:      + bowel sounds; soft, non-tender; no palpable masses, no distension Ext:    No edema; adequate peripheral perfusion Skin:      Warm and dry; no rash Neuro: alert and oriented x 3 Psych: normal mood and affect  Data Reviewed:  Reviewed labs, radiology imaging, old records and pertinent past GI work up   Assessment and Plan/Recommendations:  32 year old female with complaints of constipation, bright red blood per rectum and rectal discomfort On rectal exam and anoscopy has right posterior and left lateral anal fissure.  Small grade 2 internal hemorrhoids Start rectal nitroglycerin 0.125%  small pea-sized amount 3 times daily for 6 to 8 weeks Benefiber 1 teaspoon 3 times daily with meals Increase water intake to 60 to 80 ounces daily If continues to have persistent constipation, will consider starting MiraLAX 1 capful daily Return in 2 months or sooner if needed Persistent intermittent rectal bleeding and rectal discomfort, will consider colonoscopy for further evaluation to exclude IBD or a neoplastic lesion    K. Scherry Ran , MD [phone]    CC: Sherren Mocha, MD

## 2018-10-03 NOTE — Patient Instructions (Addendum)
Anal Fissure, Adult An anal fissure is a small tear or crack in the skin around the opening of the butt (anus).Bleeding from the tear or crack usually stops on its own within a few minutes. The bleeding may happen every time you poop (have a bowel movement) until the tear or crack heals. Follow these instructions at home: Eating and drinking  Avoid bananas and dairy products. These foods can make it hard to poop.  Drink enough fluid to keep your pee (urine) clear or pale yellow.  Eat a lot of fruit, whole grains, and vegetables. General instructions  Keep the butt area as clean and dry as you can.  Take a warm water bath (sitz bath) as told by your doctor. Do not use soap.  Take over-the-counter and prescription medicines only as told by your doctor.  Use creams or ointments only as told by your doctor.  Keep all follow-up visits as told by your doctor. This is important. Contact a doctor if:  You have more bleeding.  You have a fever.  You have watery poop (diarrhea) that is mixed with blood.  You have pain.  You problem gets worse, not better. This information is not intended to replace advice given to you by your health care provider. Make sure you discuss any questions you have with your health care provider. Document Released: 07/28/2011 Document Revised: 05/06/2016 Document Reviewed: 02/24/2015 Elsevier Interactive Patient Education  [employer].   We have given you a printed rx for Nitroglycerin to take to [facility]  Take BeneFiber 1 teaspoon three times daily with meals   If you are age 86 or older, your body mass index should be between 23-30. Your Body mass index is 39.35 kg/m. If this is out of the aforementioned range listed, please consider follow up with your Primary Care Provider.  If you are age 30 or younger, your body mass index should be between 19-25. Your Body mass index is 39.35 kg/m. If this is out of the aformentioned range  listed, please consider follow up with your Primary Care Provider.   Thank you for choosing [facility]  Philbert Riser Nandigam,MD

## 2018-10-05 ENCOUNTER — Telehealth: Payer: Self-pay | Admitting: Gastroenterology

## 2018-10-05 NOTE — Telephone Encounter (Signed)
Pt called back and states you can live a detail massage on her VM

## 2018-10-05 NOTE — Telephone Encounter (Signed)
No answer. Left message to call back.   

## 2018-10-05 NOTE — Telephone Encounter (Signed)
Issues with comfort. Bowel movement is very painful. Spoke with the patient. She will use Recticare. Per Dr Lavon Paganini, mix it with the Nitro paste and then she can also use the Recticare an additional couple of times for a total of 4 to 5 times per day.

## 2018-10-05 NOTE — Telephone Encounter (Signed)
Patient states she has done everything Dr.Nandigam advised, for the past 2 days, but still feels the same. Patient wanting to know when she will start feeling better.

## 2018-10-06 ENCOUNTER — Encounter: Payer: Self-pay | Admitting: Gastroenterology

## 2018-10-13 ENCOUNTER — Telehealth: Payer: Self-pay | Admitting: Gastroenterology

## 2018-10-13 NOTE — Telephone Encounter (Signed)
I called her back with this information.  She corrected me on my misunderstanding of the issue. She feels her bowel movements are too soft. She will try a decrease of the Benefiber by 1 tsp. Warned not to allow constipation. Discussed the reasons why which she states she is aware of.

## 2018-10-13 NOTE — Telephone Encounter (Signed)
Much more comfortable. Feels she is healing the anal fissure. Improvement from last week. Calls because despite Benefiber 1 tsp TID and about 60 ounces of water daily, she continues to have "ragged stools." Not soft stools and has not had a "good bowel movement" since Monday. Please advise.

## 2018-10-13 NOTE — Telephone Encounter (Signed)
Please advise patient to start Miralax 1 capful daily and titrate up based on response.

## 2018-10-17 DIAGNOSIS — F411 Generalized anxiety disorder: Secondary | ICD-10-CM | POA: Diagnosis not present

## 2018-10-18 ENCOUNTER — Other Ambulatory Visit: Payer: Self-pay | Admitting: Family Medicine

## 2018-10-20 ENCOUNTER — Ambulatory Visit: Payer: BLUE CROSS/BLUE SHIELD | Admitting: Obstetrics & Gynecology

## 2018-10-20 ENCOUNTER — Other Ambulatory Visit (HOSPITAL_COMMUNITY)
Admission: RE | Admit: 2018-10-20 | Discharge: 2018-10-20 | Disposition: A | Discharge status: Home / Self Care | Payer: BLUE CROSS/BLUE SHIELD | Source: Ambulatory Visit | Attending: Obstetrics & Gynecology | Admitting: Obstetrics & Gynecology

## 2018-10-20 ENCOUNTER — Encounter: Payer: Self-pay | Admitting: Obstetrics & Gynecology

## 2018-10-20 ENCOUNTER — Other Ambulatory Visit: Payer: Self-pay

## 2018-10-20 ENCOUNTER — Ambulatory Visit: Payer: 59 | Admitting: Obstetrics & Gynecology

## 2018-10-20 VITALS — BP 122/70 | HR 92 | Resp 16 | Ht 68.0 in | Wt 256.2 lb

## 2018-10-20 DIAGNOSIS — R87612 Low grade squamous intraepithelial lesion on cytologic smear of cervix (LGSIL): Secondary | ICD-10-CM

## 2018-10-20 DIAGNOSIS — Z124 Encounter for screening for malignant neoplasm of cervix: Secondary | ICD-10-CM | POA: Diagnosis not present

## 2018-10-20 DIAGNOSIS — Z01419 Encounter for gynecological examination (general) (routine) without abnormal findings: Secondary | ICD-10-CM | POA: Diagnosis not present

## 2018-10-20 DIAGNOSIS — Z803 Family history of malignant neoplasm of breast: Secondary | ICD-10-CM | POA: Diagnosis not present

## 2018-10-20 DIAGNOSIS — Z1231 Encounter for screening mammogram for malignant neoplasm of breast: Secondary | ICD-10-CM | POA: Diagnosis not present

## 2018-10-20 NOTE — Progress Notes (Signed)
32 y.o. G0P0000 Single White or Caucasian female here for annual exam.  Working on diet and exercise and cholesterol levels.  Cholesterol is better.  Diet changes did cause some stool changes which caused a fissure.  Saw Dr. Lavon Paganini.  Treated with nitroglycerine.  Using benefiber and this has really helped.  Would like me to check today to see if appears better.    Cycles are regular.  Stopped OCPs in July.  Has not been SA this year.  Does not need STD testing today.  Patient's last menstrual period was 10/17/2018.          Sexually active: No.  The current method of family planning is abstinence.    Exercising: Yes.    cardio, strength training  Smoker:  no  Health Maintenance: Pap:  08/18/17 LGSIL.  History of abnormal Pap:  yes MMG:  Never TDaP:  2016 Hep C testing: 08/18/17 neg  Screening Labs: PCP   reports that she has never smoked. She has never used smokeless tobacco. She reports that she drinks about 1.0 - 2.0 standard drinks of alcohol per week. She reports that she does not use drugs.  Past Medical History:  Diagnosis Date  . Anal fissure   . Anxiety   . Depression   . History of PSVT (paroxysmal supraventricular tachycardia)    managed with Beta Blockers & Vagal Maneuvers.  Marland Kitchen HTN (hypertension)   . Hyperlipidemia   . 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 Mononucleosis 02/2017    Past Surgical History:  Procedure Laterality Date  . COLPOSCOPY  10/2017  . TRANSTHORACIC ECHOCARDIOGRAM  03/2017   Normal EF 55-60%. GR 1 DD. Normal Valves - no MVP  . WISDOM TOOTH EXTRACTION      Current Outpatient Medications  Medication Sig Dispense Refill  . AMBULATORY NON FORMULARY MEDICATION Medication Name: 0.125% Nitroglycerin ointment Use pea sized amount per rectum three times daily 30 g 1  . buPROPion (WELLBUTRIN SR) 150 MG 12 hr tablet TAKE 1 TABLET BY MOUTH EVERY MORNING X 2 WEEKS, THEN 2 TABLETS BY MOUTH DAILY 180 tablet 1  . cholecalciferol (VITAMIN D) 1000 UNITS  tablet Take 1,000 Units by mouth daily.     . citalopram (CELEXA) 20 MG tablet TAKE 1/2 TABLET BY MOUTH DAILY X 1 WEEK, THEN 1 TABLET BY MOUTH DAILY 30 tablet 0  . metoprolol succinate (TOPROL-XL) 50 MG 24 hr tablet TAKE 1 TABLET BY MOUTH EVERYDAY AT BEDTIME 90 tablet 1  . Omega-3 Fatty Acids (FISH OIL) 1000 MG CAPS Take by mouth daily.    . simvastatin (ZOCOR) 40 MG tablet TAKE 1 TABLET BY MOUTH EVERY DAY 90 tablet 1  . traZODone (DESYREL) 50 MG tablet TAKE 1-2 TABLETS (50-100 MG TOTAL) BY MOUTH AT BEDTIME AS NEEDED FOR SLEEP. D/C DOXEPIN 60 tablet 5   No current facility-administered medications for this visit.     Family History  Problem Relation Age of Onset  . Hyperlipidemia Mother   . Breast cancer Mother   . Hyperlipidemia Father   . Depression Sister   . Heart disease Maternal Grandfather   . Diabetes Maternal Grandfather   . Lung cancer Paternal Grandfather   . Heart attack Maternal Uncle     Review of Systems  Genitourinary:       Change in quality of stools   All other systems reviewed and are negative.   Exam:   BP 122/70 (BP Location: Right Arm, Patient Position: Sitting, Cuff Size: Large)  Pulse 92   Resp 16   Ht 5\' 8"  (1.727 m)   Wt 256 lb 3.2 oz (116.2 kg)   LMP 10/17/2018   BMI 38.96 kg/m    Height: 5\' 8"  (172.7 cm)  Ht Readings from Last 3 Encounters:  10/20/18 5\' 8"  (1.727 m)  10/03/18 5' 7.5" (1.715 m)  06/28/18 5\' 9"  (1.753 m)    General appearance: alert, cooperative and appears stated age Head: Normocephalic, without obvious abnormality, atraumatic Neck: no adenopathy, supple, symmetrical, trachea midline and thyroid normal to inspection and palpation Lungs: clear to auscultation bilaterally Breasts: normal appearance, no masses or tenderness Heart: regular rate and rhythm Abdomen: soft, non-tender; bowel sounds normal; no masses,  no organomegaly Extremities: extremities normal, atraumatic, no cyanosis or edema Skin: Skin color, texture,  turgor normal. No rashes or lesions Lymph nodes: Cervical, supraclavicular, and axillary nodes normal. No abnormal inguinal nodes palpated Neurologic: Grossly normal   Pelvic: External genitalia:  Two skin tags              Urethra:  normal appearing urethra with no masses, tenderness or lesions              Bartholins and Skenes: normal                 Vagina: normal appearing vagina with normal color and discharge, no lesions              Cervix: no lesions              Pap taken: Yes.   Bimanual Exam:  Uterus:  normal size, contour, position, consistency, mobility, non-tender              Adnexa: normal adnexa and no mass, fullness, tenderness               Rectovaginal: Confirms               Anus:  normal sphincter tone,small fissure at 5 o'clock.  (compared with Dr. Elana Alm note, this appears smaller and the second fissure is resolved)  Chaperone was present for exam.  A:  Well Woman with normal exam H/o LGSIL pap smear Mother with breast cancer in her late 70's (alive) Hyperlipidemia, on mediation Obesity with weight loss Recent anal fissure  P:   Mammogram should be initiated.  Will have this scheduled for her due to age pap smear with HR HPV obtained today Blood work being done with Dr. Norberto Sorenson return annually or prn

## 2018-10-20 NOTE — Progress Notes (Signed)
Screening mammogram scheduled today at [facility] 10/20/18 at 1615.

## 2018-10-22 ENCOUNTER — Other Ambulatory Visit: Payer: Self-pay | Admitting: Family Medicine

## 2018-10-23 LAB — CYTOLOGY - PAP
Diagnosis: NEGATIVE
HPV: NOT DETECTED

## 2018-10-23 NOTE — Telephone Encounter (Signed)
Requested Prescriptions  Pending Prescriptions Disp Refills  . citalopram (CELEXA) 20 MG tablet [Pharmacy Med Name: CITALOPRAM HBR 20 MG TABLET] 30 tablet 0    Sig: Take one tablet by mouth daily.     Psychiatry:  Antidepressants - SSRI Passed - 10/22/2018 10:23 AM      Passed - Completed PHQ-2 or PHQ-9 in the last 360 days.      Passed - Valid encounter within last 6 months    Recent Outpatient Visits          3 months ago Class 2 severe obesity due to excess calories with serious comorbidity and body mass index (BMI) of 38.0 to 38.9 in adult [facility])   [facility], Levell July, MD   5 months ago Dyslipidemia   Primary Care at [facility], Madelaine Bhat, PA-C   6 months ago Dyslipidemia   [facility], Levell July, MD   1 year ago Annual physical exam   [facility], Levell July, MD   1 year ago Gammaherpesviral mononucleosis with other complications   [facility], Levell July, MD      Future Appointments            In 1 month Sherren Mocha, MD Primary Care at [facility], [facility]         30 day courtesy provided to bridge to 11/23/18 appointment. Noted to pharmacy, patient must keep upcoming appointment for additional refills.

## 2018-10-24 ENCOUNTER — Telehealth: Payer: Self-pay | Admitting: *Deleted

## 2018-10-24 NOTE — Telephone Encounter (Signed)
Patient returning Reina's call. States can leave detailed message at [phone] if she is not available.

## 2018-10-24 NOTE — Telephone Encounter (Signed)
Patient left voicemail over lunch returning call to Reina. °

## 2018-10-24 NOTE — Telephone Encounter (Signed)
Patient returned call

## 2018-10-24 NOTE — Telephone Encounter (Signed)
-----   Message from Jerene Bears, MD sent at 10/24/2018  8:31 AM EST ----- Please let pt know her pap and HR HPV testing were both negative.  Had LGSIL pap last year so needs repeat pap and HR HPV in 1 year.  Make sure appt is right at one year.  08 recall.  Thanks.

## 2018-10-24 NOTE — Telephone Encounter (Signed)
LM for pt with normal result and to call back to schedule AEX - 08 recall . Per pt release form.

## 2018-10-24 NOTE — Telephone Encounter (Signed)
LM for pt to call back.

## 2018-10-30 ENCOUNTER — Telehealth: Payer: Self-pay | Admitting: Gastroenterology

## 2018-10-30 NOTE — Telephone Encounter (Signed)
Anal fissure under treatment. She has gone back to the burning sensation and a little bleeding. These symptoms had stopped. She did have her GYN exam including the digital rectal exam. Admits this was painful. Her bowels are moving and she is not constipated. She will continue with her treatment and comfort measures. Please advise.

## 2018-10-30 NOTE — Telephone Encounter (Signed)
Please advise to continue Rectal nitroglycerine 3-4 times daily for additional 1-2 months. Benefiber 1 teaspoon TID. Sitz bath or soak 1-2 times daily. Follow up in office visit .

## 2018-10-31 ENCOUNTER — Encounter: Payer: BLUE CROSS/BLUE SHIELD | Admitting: Family Medicine

## 2018-10-31 NOTE — Telephone Encounter (Signed)
Called. Had to leave a detail voicemail. Recommendations given. Asked she call the pharmacy if she needs more Nitroglycerine ointment and we will authorize the refills. Call if she has questions. She was already scheduled for follow up. Left the appointment information on the voicemail also.

## 2018-11-13 ENCOUNTER — Ambulatory Visit: Payer: BLUE CROSS/BLUE SHIELD | Admitting: Gastroenterology

## 2018-11-13 DIAGNOSIS — F411 Generalized anxiety disorder: Secondary | ICD-10-CM | POA: Diagnosis not present

## 2018-11-19 ENCOUNTER — Other Ambulatory Visit: Payer: Self-pay | Admitting: Family Medicine

## 2018-11-23 ENCOUNTER — Encounter: Payer: Self-pay | Admitting: Family Medicine

## 2018-11-23 ENCOUNTER — Telehealth: Payer: Self-pay | Admitting: Family Medicine

## 2018-11-23 ENCOUNTER — Ambulatory Visit: Payer: BLUE CROSS/BLUE SHIELD | Admitting: Family Medicine

## 2018-11-23 ENCOUNTER — Other Ambulatory Visit: Payer: Self-pay

## 2018-11-23 VITALS — BP 130/83 | HR 85 | Temp 98.5°F | Resp 16 | Ht 68.9 in | Wt 255.0 lb

## 2018-11-23 DIAGNOSIS — N6489 Other specified disorders of breast: Secondary | ICD-10-CM

## 2018-11-23 DIAGNOSIS — E559 Vitamin D deficiency, unspecified: Secondary | ICD-10-CM

## 2018-11-23 DIAGNOSIS — F411 Generalized anxiety disorder: Secondary | ICD-10-CM | POA: Diagnosis not present

## 2018-11-23 DIAGNOSIS — E66812 Obesity, class 2: Secondary | ICD-10-CM

## 2018-11-23 DIAGNOSIS — N62 Hypertrophy of breast: Secondary | ICD-10-CM

## 2018-11-23 DIAGNOSIS — I471 Supraventricular tachycardia, unspecified: Secondary | ICD-10-CM

## 2018-11-23 DIAGNOSIS — F331 Major depressive disorder, recurrent, moderate: Secondary | ICD-10-CM | POA: Diagnosis not present

## 2018-11-23 DIAGNOSIS — G8929 Other chronic pain: Secondary | ICD-10-CM

## 2018-11-23 DIAGNOSIS — M545 Low back pain: Secondary | ICD-10-CM

## 2018-11-23 DIAGNOSIS — E785 Hyperlipidemia, unspecified: Secondary | ICD-10-CM | POA: Diagnosis not present

## 2018-11-23 DIAGNOSIS — Z6838 Body mass index (BMI) 38.0-38.9, adult: Secondary | ICD-10-CM | POA: Diagnosis not present

## 2018-11-23 DIAGNOSIS — F5104 Psychophysiologic insomnia: Secondary | ICD-10-CM

## 2018-11-23 MED ORDER — METHYLPHENIDATE HCL ER (LA) 20 MG PO CP24: 20.0000 mg | ORAL_CAPSULE | ORAL | 0 refills | Status: DC

## 2018-11-23 NOTE — Telephone Encounter (Signed)
Future labs entered for visit today

## 2018-11-23 NOTE — Progress Notes (Signed)
Subjective:    Patient: Rose Cortez  DOB: 01/21/1986; 32 y.o.   MRN: [id number]  Chief Complaint  Patient presents with  . Dyslipidemia    6 month follow-up   . Depression    HPI In July she had a whole summer off and buspar. Seeing Salomon Fick at [facility] - sees her next Tues Dad on low dose of ritaline, uncle w/ add, uncle with depression, sister with add on effexor.  Her therapist thinks there is a good chance she could have it.   Medical History Past Medical History:  Diagnosis Date  . Anal fissure   . Anxiety   . Depression   . History of PSVT (paroxysmal supraventricular tachycardia)    managed with Beta Blockers & Vagal Maneuvers.  Marland Kitchen HTN (hypertension)   . Hyperlipidemia   . 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 Mononucleosis 02/2017   Past Surgical History:  Procedure Laterality Date  . COLPOSCOPY  10/2017  . TRANSTHORACIC ECHOCARDIOGRAM  03/2017   Normal EF 55-60%. GR 1 DD. Normal Valves - no MVP  . WISDOM TOOTH EXTRACTION     Current Outpatient Medications on File Prior to Visit  Medication Sig Dispense Refill  . AMBULATORY NON FORMULARY MEDICATION Medication Name: 0.125% Nitroglycerin ointment Use pea sized amount per rectum three times daily 30 g 1  . buPROPion (WELLBUTRIN SR) 150 MG 12 hr tablet TAKE 1 TABLET BY MOUTH EVERY MORNING X 2 WEEKS, THEN 2 TABLETS BY MOUTH DAILY 180 tablet 1  . cholecalciferol (VITAMIN D) 1000 UNITS tablet Take 1,000 Units by mouth daily.     . citalopram (CELEXA) 20 MG tablet Take one tablet by mouth daily. 30 tablet 0  . metoprolol succinate (TOPROL-XL) 50 MG 24 hr tablet TAKE 1 TABLET BY MOUTH EVERYDAY AT BEDTIME 90 tablet 1  . Omega-3 Fatty Acids (FISH OIL) 1000 MG CAPS Take by mouth daily.    . simvastatin (ZOCOR) 40 MG tablet TAKE 1 TABLET BY MOUTH EVERY DAY 90 tablet 1  . traZODone (DESYREL) 50 MG tablet TAKE 1-2 TABLETS (50-100 MG TOTAL) BY MOUTH AT BEDTIME AS NEEDED FOR SLEEP. D/C DOXEPIN 60  tablet 5   No current facility-administered medications on file prior to visit.    No Known Allergies Family History  Problem Relation Age of Onset  . Hyperlipidemia Mother   . Breast cancer Mother 68  . Hyperlipidemia Father   . Depression Sister   . Heart disease Maternal Grandfather   . Diabetes Maternal Grandfather   . Lung cancer Paternal Grandfather   . Heart attack Maternal Uncle    Social History   Socioeconomic History  . Marital status: Single    Spouse name: Not on file  . Number of children: 0  . Years of education: Not on file  . Highest education level: Not on file  Occupational History  . Occupation: Runner, broadcasting/film/video  Social Needs  . Financial resource strain: Not on file  . Food insecurity:    Worry: Not on file    Inability: Not on file  . Transportation needs:    Medical: Not on file    Non-medical: Not on file  Tobacco Use  . Smoking status: Never Smoker  . Smokeless tobacco: Never Used  Substance and Sexual Activity  . Alcohol use: Yes    Alcohol/week: 1.0 - 2.0 standard drinks    Types: 1 - 2 Standard drinks or equivalent per week  . Drug use: No  .  Sexual activity: Not Currently    Birth control/protection: Abstinence  Lifestyle  . Physical activity:    Days per week: Not on file    Minutes per session: Not on file  . Stress: Not on file  Relationships  . Social connections:    Talks on phone: Not on file    Gets together: Not on file    Attends religious service: Not on file    Active member of club or organization: Not on file    Attends meetings of clubs or organizations: Not on file    Relationship status: Not on file  Other Topics Concern  . Not on file  Social History Narrative  . Not on file   Depression screen [facility] 2/9 11/23/2018 06/28/2018 04/20/2018 08/18/2017 03/24/2017  Decreased Interest 1 0 1 0 0  Down, Depressed, Hopeless 1 0 0 0 0  PHQ - 2 Score 2 0 1 0 0  Altered sleeping 0 - 2 - -  Tired, decreased energy 1 - 0 - -  Change in  appetite 0 - 0 - -  Feeling bad or failure about yourself  1 - 1 - -  Trouble concentrating 0 - 1 - -  Moving slowly or fidgety/restless 0 - 0 - -  Suicidal thoughts 0 - 0 - -  PHQ-9 Score 4 - 5 - -  Difficult doing work/chores - - Not difficult at all - -    ROS As noted in HPI  Objective:  BP 130/83   Pulse 85   Temp 98.5 F (36.9 C) (Oral)   Resp 16   Ht 5' 8.9" (1.75 m)   Wt 255 lb (115.7 kg)   LMP 11/13/2018 (Approximate)   SpO2 99%   BMI 37.77 kg/m  Physical Exam Constitutional:      General: She is not in acute distress.    Appearance: She is well-developed. She is not diaphoretic.  HENT:     Head: Normocephalic and atraumatic.     Right Ear: External ear normal.     Left Ear: External ear normal.  Eyes:     General: No scleral icterus.    Conjunctiva/sclera: Conjunctivae normal.  Neck:     Musculoskeletal: Normal range of motion and neck supple.     Thyroid: No thyromegaly.  Cardiovascular:     Rate and Rhythm: Normal rate and regular rhythm.     Heart sounds: Normal heart sounds.  Pulmonary:     Effort: Pulmonary effort is normal. No respiratory distress.     Breath sounds: Normal breath sounds.  Lymphadenopathy:     Cervical: No cervical adenopathy.  Skin:    General: Skin is warm and dry.     Findings: No erythema.  Neurological:     Mental Status: She is alert and oriented to person, place, and time.  Psychiatric:        Behavior: Behavior normal.     POC TESTING Office Visit on 11/23/2018  Component Date Value Ref Range Status  . Glucose 11/23/2018 89  65 - 99 mg/dL Final  . BUN 96/03/5408 14  6 - 20 mg/dL Final  . Creatinine, Ser 11/23/2018 0.76  0.57 - 1.00 mg/dL Final  . GFR calc non Af Amer 11/23/2018 105  >59 mL/min/1.73 Final  . GFR calc Af Amer 11/23/2018 121  >59 mL/min/1.73 Final  . BUN/Creatinine Ratio 11/23/2018 18  9 - 23 Final  . Sodium 11/23/2018 141  134 - 144 mmol/L Final  . Potassium  11/23/2018 4.7  3.5 - 5.2 mmol/L Final   . Chloride 11/23/2018 102  96 - 106 mmol/L Final  . CO2 11/23/2018 22  20 - 29 mmol/L Final  . Calcium 11/23/2018 9.4  8.7 - 10.2 mg/dL Final  . Total Protein 11/23/2018 7.0  6.0 - 8.5 g/dL Final  . Albumin 40/98/1191 4.2  3.5 - 5.5 g/dL Final  . Globulin, Total 11/23/2018 2.8  1.5 - 4.5 g/dL Final  . Albumin/Globulin Ratio 11/23/2018 1.5  1.2 - 2.2 Final  . Bilirubin Total 11/23/2018 0.3  0.0 - 1.2 mg/dL Final  . Alkaline Phosphatase 11/23/2018 62  39 - 117 IU/L Final  . AST 11/23/2018 18  0 - 40 IU/L Final  . ALT 11/23/2018 19  0 - 32 IU/L Final  . Cholesterol, Total 11/23/2018 169  100 - 199 mg/dL Final  . Triglycerides 11/23/2018 99  0 - 149 mg/dL Final  . HDL 47/82/9562 51  >39 mg/dL Final  . VLDL Cholesterol Cal 11/23/2018 20  5 - 40 mg/dL Final  . LDL Calculated 11/23/2018 98  0 - 99 mg/dL Final  . Chol/HDL Ratio 11/23/2018 3.3  0.0 - 4.4 ratio Final   Comment:                                   T. Chol/HDL Ratio                                             Men  Women                               1/2 Avg.Risk  3.4    3.3                                   Avg.Risk  5.0    4.4                                2X Avg.Risk  9.6    7.1                                3X Avg.Risk 23.4   11.0   . Hgb A1c MFr Bld 11/23/2018 5.5  4.8 - 5.6 % Final   Comment:          Prediabetes: 5.7 - 6.4          Diabetes: >6.4          Glycemic control for adults with diabetes: <7.0   . Est. average glucose Bld gHb Est-m* 11/23/2018 111  mg/dL Final  . Vit D, 13-YQMVHQI 11/23/2018 39.8  30.0 - 100.0 ng/mL Final   Comment: Vitamin D deficiency has been defined by the Institute of Medicine and an Endocrine Society practice guideline as a level of serum 25-OH vitamin D less than 20 ng/mL (1,2). The Endocrine Society went on to further define vitamin D insufficiency as a level between 21 and 29 ng/mL (2). 1. IOM (Institute of Medicine). 2010. Dietary reference    intakes for calcium and D. [place]  DC: The    [employer]. 2. Holick MF, [place], Bischoff-Ferrari HA, et al.    Evaluation, treatment, and prevention of vitamin D    deficiency: an Endocrine Society clinical practice    guideline. JCEM. 2011 Jul; 96(7):1911-30.   Marland Kitchen TSH 11/23/2018 0.92  uU/mL Final   Comment: Reference Range: Non-Pregnant Adult 0.450-4.500 Pregnancy First Trimester 0.100-4.000 Second Trimester 0.200-4.000 Third Trimester 0.300-4.500   . T4 11/23/2018 7.1  ug/dL Final   Comment: Reference Range: Adults: 4.2 - 13.0   . Free T-3 11/23/2018 3.5  pg/mL Final   Comment: Reference Range: >=20y: 2.0 - 4.4   . Triiodothyronine (T-3), Serum 11/23/2018 128  ng/dL Final   Comment: Reference Range: Adults: 55 - 170   . Testosterone, Serum (Total) 11/23/2018 25  ng/dL Final   Comment: Reference Range: Adult Females   Premenopausal  10 - 55   Postmenopausal  7 - 40   . Free Testosterone, Serum 11/23/2018 4.3  pg/mL Final   Comment: Reference Range: Adult Females: 1.1 - 6.3   . Testosterone-% Free 11/23/2018 1.7  % Final   Comment: Reference Range: Adult Females: 0.8 - 1.4   . Follicle Stimulating Hormone 11/23/2018 10  mIU/mL Final   Comment: Reference Range: Follicular and Luteal: 1.8 - 11.2 Mid-cycle Peak: 6 - 35   . Progesterone, Serum 11/23/2018 <10  ng/dL Final   Comment: Reference Range: Adult Females Cycle Days          Range    1 -  6        <10 - 17    7 - 12        <10 - 135   13 - 15        <10 - 1563   16 - 28        <10 - 2555 Post Menopausal    <10 Note: Luteal progesterone peaked from 350 to 3750 ng/dL on days ranging from 17 to 23.   Marland Kitchen DHEA-Sulfate, LCMS 11/23/2018 127  ug/dL Final   Comment: Reference Range: Adult Females (31 - 40y): 17 - 286   . Sex Hormone Binding Globulin 11/23/2018 28.7  nmol/L Final   Comment: Reference Range: Pubertal: 36.0 - 125.0 20 - 49y: 24.6 - 122.0 >49y:     17.3 - 125.0   . Estrone Sulfate 11/23/2018 60  ng/dL Final    Comment: Reference Range: Adult Females: Early Follicular (days 0-6)                 <10 - 154 Late Follicular (days 7-luteal)                  15 - 390 Luteal          <10 - 373 Post Menopausal <10 - 69   . Estradiol, Serum, MS 11/23/2018 27  pg/mL Final   Comment: Reference Range: Adult Females  Follicular: 30 - 100  Luteal: 70 - 300  Postmenopausal: <15      Assessment & Plan:   1. Pendulous breast   2. Dyslipidemia   3. Vitamin D deficiency   4. Class 2 severe obesity due to excess calories with serious comorbidity and body mass index (BMI) of 38.0 to 38.9 in adult (HCC)   5. Moderate episode of recurrent major depressive disorder (HCC)   6. PSVT (paroxysmal supraventricular tachycardia) (HCC)   7. GAD (generalized anxiety disorder)   8. Psychophysiological insomnia   9. Gynecomastia, female  10. Chronic bilateral low back pain without sciatica     Patient will continue on current chronic medications other than changes noted above, so ok to refill when needed.   See after visit summary for patient specific instructions.  Orders Placed This Encounter  Procedures  . Ambulatory referral to Plastic Surgery    Referral Priority:   Routine    Referral Type:   Surgical    Referral Reason:   Specialty Services Required    Requested Specialty:   Plastic Surgery    Number of Visits Requested:   1    Meds ordered this encounter  Medications  . methylphenidate (RITALIN LA) 20 MG 24 hr capsule    Sig: Take 1 capsule (20 mg total) by mouth every morning.    Dispense:  30 capsule    Refill:  0    Patient verbalized to me that they understand the following: diagnosis, what is being done for them, what to expect and what should be done at home.  Their questions have been answered. They understand that I am unable to predict every possible medication interaction or adverse outcome and that if any unexpected symptoms arise, they should contact us and their pharmacist, as well  as never hesitate to seek urgent/emergent care at [facility] or ER if they think it might be warranted.    Over 40 min spent in face-to-face evaluation of and consultation with patient and coordination of care.  Over 50% of this time was spent counseling this patient regarding above.  Norberto SorensonEva Shaw, MD, MPH Primary Care at [facility]  [facility] [address] [place]  [id number] [phone] Office phone  [phone] Office fax  11/23/18 4:28 PM

## 2018-11-23 NOTE — Patient Instructions (Signed)
° ° ° °  If you have lab work done today you will be contacted with your lab results within the next 2 weeks.  If you have not heard from us then please contact us. The fastest way to get your results is to register for My Chart. ° ° °IF you received an x-ray today, you will receive an invoice from [facility]. Please contact [facility] at [phone] with questions or concerns regarding your invoice.  ° °IF you received labwork today, you will receive an invoice from [facility]. Please contact [facility] at [phone] with questions or concerns regarding your invoice.  ° °Our billing staff will not be able to assist you with questions regarding bills from these companies. ° °You will be contacted with the lab results as soon as they are available. The fastest way to get your results is to activate your My Chart account. Instructions are located on the last page of this paperwork. If you have not heard from us regarding the results in 2 weeks, please contact this office. °  ° ° ° °

## 2018-11-24 LAB — COMPREHENSIVE METABOLIC PANEL
ALT: 19 IU/L (ref 0–32)
AST: 18 IU/L (ref 0–40)
Albumin/Globulin Ratio: 1.5 (ref 1.2–2.2)
Albumin: 4.2 g/dL (ref 3.5–5.5)
Alkaline Phosphatase: 62 IU/L (ref 39–117)
BUN/Creatinine Ratio: 18 (ref 9–23)
BUN: 14 mg/dL (ref 6–20)
Bilirubin Total: 0.3 mg/dL (ref 0.0–1.2)
CO2: 22 mmol/L (ref 20–29)
Calcium: 9.4 mg/dL (ref 8.7–10.2)
Chloride: 102 mmol/L (ref 96–106)
Creatinine, Ser: 0.76 mg/dL (ref 0.57–1.00)
GFR calc Af Amer: 121 mL/min/{1.73_m2} (ref >59)
GFR calc non Af Amer: 105 mL/min/{1.73_m2} (ref >59)
Globulin, Total: 2.8 g/dL (ref 1.5–4.5)
Glucose: 89 mg/dL (ref 65–99)
Potassium: 4.7 mmol/L (ref 3.5–5.2)
Sodium: 141 mmol/L (ref 134–144)
Total Protein: 7 g/dL (ref 6.0–8.5)

## 2018-11-24 LAB — LIPID PANEL
Chol/HDL Ratio: 3.3 ratio (ref 0.0–4.4)
Cholesterol, Total: 169 mg/dL (ref 100–199)
HDL: 51 mg/dL (ref >39)
LDL Calculated: 98 mg/dL (ref 0–99)
TRIGLYCERIDES: 99 mg/dL (ref 0–149)
VLDL Cholesterol Cal: 20 mg/dL (ref 5–40)

## 2018-11-27 ENCOUNTER — Ambulatory Visit: Payer: BLUE CROSS/BLUE SHIELD | Admitting: Gastroenterology

## 2018-11-27 ENCOUNTER — Encounter: Payer: Self-pay | Admitting: Gastroenterology

## 2018-11-27 ENCOUNTER — Other Ambulatory Visit: Payer: Self-pay

## 2018-11-27 ENCOUNTER — Encounter: Payer: Self-pay | Admitting: Obstetrics & Gynecology

## 2018-11-27 VITALS — BP 120/70 | HR 84 | Ht 67.5 in | Wt 256.5 lb

## 2018-11-27 DIAGNOSIS — K602 Anal fissure, unspecified: Secondary | ICD-10-CM | POA: Diagnosis not present

## 2018-11-27 DIAGNOSIS — K625 Hemorrhage of anus and rectum: Secondary | ICD-10-CM | POA: Diagnosis not present

## 2018-11-27 MED ORDER — CITALOPRAM HYDROBROMIDE 20 MG PO TABS: ORAL_TABLET | ORAL | 1 refills | Status: DC

## 2018-11-27 NOTE — Patient Instructions (Signed)
You have been scheduled for a colonoscopy. Please follow written instructions given to you at your visit today.  Please pick up your prep supplies at the pharmacy within the next 1-3 days. If you use inhalers (even only as needed), please bring them with you on the day of your procedure.   Continue Benefiber  Continue Nitroglycerin ointment   If you are age 31 or older, your body mass index should be between 23-30. Your Body mass index is 39.58 kg/m. If this is out of the aforementioned range listed, please consider follow up with your Primary Care Provider.  If you are age 564 or younger, your body mass index should be between 19-25. Your Body mass index is 39.58 kg/m. If this is out of the aformentioned range listed, please consider follow up with your Primary Care Provider.    Thank you for choosing [facility]  Philbert RiserKavitha Nandigam,MD

## 2018-11-27 NOTE — Progress Notes (Signed)
Jory SimsMarie Rosselli    [id number]    Mar 25, 1986  Primary Care Physician:Shaw, Levell JulyEva N, MD  Referring Physician: Sherren MochaShaw, Eva N, MD [address] [place], [place] [address]  Chief complaint: Constipation, anal fissure  HPI: 32 year old female here for follow-up visit.  She was last seen in office October 03, 2018. Anal pain and discomfort is improving, but continues to have intermittent bright red blood per rectum.  She continues to have decreased stool caliber with flat stool.  Also has difficulty evacuating with intermittent constipation. No abdominal pain, nausea, vomiting, dysphagia, loss of appetite or weight loss   Outpatient Encounter Medications as of 11/27/2018  Medication Sig  . AMBULATORY NON FORMULARY MEDICATION Medication Name: 0.125% Nitroglycerin ointment Use pea sized amount per rectum three times daily  . buPROPion (WELLBUTRIN SR) 150 MG 12 hr tablet TAKE 1 TABLET BY MOUTH EVERY MORNING X 2 WEEKS, THEN 2 TABLETS BY MOUTH DAILY  . cholecalciferol (VITAMIN D) 1000 UNITS tablet Take 1,000 Units by mouth daily.   . citalopram (CELEXA) 20 MG tablet Take one tablet by mouth daily.  . methylphenidate (RITALIN LA) 20 MG 24 hr capsule Take 1 capsule (20 mg total) by mouth every morning.  . metoprolol succinate (TOPROL-XL) 50 MG 24 hr tablet TAKE 1 TABLET BY MOUTH EVERYDAY AT BEDTIME  . Omega-3 Fatty Acids (FISH OIL) 1000 MG CAPS Take by mouth daily.  . simvastatin (ZOCOR) 40 MG tablet TAKE 1 TABLET BY MOUTH EVERY DAY  . traZODone (DESYREL) 50 MG tablet TAKE 1-2 TABLETS (50-100 MG TOTAL) BY MOUTH AT BEDTIME AS NEEDED FOR SLEEP. D/C DOXEPIN  . [DISCONTINUED] citalopram (CELEXA) 20 MG tablet Take one tablet by mouth daily.   No facility-administered encounter medications on file as of 11/27/2018.     Allergies as of 11/27/2018  . (No Known Allergies)    Past Medical History:  Diagnosis Date  . Anal fissure   . Anxiety   . Depression   . History of PSVT (paroxysmal  supraventricular tachycardia)    managed with Beta Blockers & Vagal Maneuvers.  Marland Kitchen. HTN (hypertension)   . Hyperlipidemia   . 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 Mononucleosis 02/2017    Past Surgical History:  Procedure Laterality Date  . COLPOSCOPY  10/2017  . TRANSTHORACIC ECHOCARDIOGRAM  03/2017   Normal EF 55-60%. GR 1 DD. Normal Valves - no MVP  . WISDOM TOOTH EXTRACTION      Family History  Problem Relation Age of Onset  . Hyperlipidemia Mother   . Breast cancer Mother [id number]  . Hyperlipidemia Father   . Depression Sister   . Heart disease Maternal Grandfather   . Diabetes Maternal Grandfather   . Lung cancer Paternal Grandfather   . Heart attack Maternal Uncle     Social History   Socioeconomic History  . Marital status: Single    Spouse name: Not on file  . Number of children: 0  . Years of education: Not on file  . Highest education level: Not on file  Occupational History  . Occupation: Runner, broadcasting/film/videoteacher  Social Needs  . Financial resource strain: Not on file  . Food insecurity:    Worry: Not on file    Inability: Not on file  . Transportation needs:    Medical: Not on file    Non-medical: Not on file  Tobacco Use  . Smoking status: Never Smoker  . Smokeless tobacco: Never Used  Substance  and Sexual Activity  . Alcohol use: Yes    Alcohol/week: 1.0 - 2.0 standard drinks    Types: 1 - 2 Standard drinks or equivalent per week  . Drug use: No  . Sexual activity: Not Currently    Birth control/protection: Abstinence  Lifestyle  . Physical activity:    Days per week: Not on file    Minutes per session: Not on file  . Stress: Not on file  Relationships  . Social connections:    Talks on phone: Not on file    Gets together: Not on file    Attends religious service: Not on file    Active member of club or organization: Not on file    Attends meetings of clubs or organizations: Not on file    Relationship status: Not on file  . Intimate partner violence:     Fear of current or ex partner: Not on file    Emotionally abused: Not on file    Physically abused: Not on file    Forced sexual activity: Not on file  Other Topics Concern  . Not on file  Social History Narrative  . Not on file      Review of systems: Review of Systems  Constitutional: Negative for fever and chills.  HENT: Negative.   Eyes: Negative for blurred vision.  Respiratory: Negative for cough, shortness of breath and wheezing.   Cardiovascular: Negative for chest pain and palpitations.  Gastrointestinal: as per HPI Genitourinary: Negative for dysuria, urgency, frequency and hematuria.  Musculoskeletal: Negative for myalgias, back pain and joint pain.  Skin: Negative for itching and rash.  Neurological: Negative for dizziness, tremors, focal weakness, seizures and loss of consciousness.  Endo/Heme/Allergies: Positive for seasonal allergies.  Psychiatric/Behavioral: Negative for depression, suicidal ideas and hallucinations.  All other systems reviewed and are negative.   Physical Exam: Vitals:   11/27/18 1502  BP: 120/70  Pulse: 84   Body mass index is 39.58 kg/m. Gen:      No acute distress HEENT:  EOMI, sclera anicteric Neck:     No masses; no thyromegaly Lungs:    Clear to auscultation bilaterally; normal respiratory effort CV:         Regular rate and rhythm; no murmurs Abd:      + bowel sounds; soft, non-tender; no palpable masses, no distension Ext:    No edema; adequate peripheral perfusion Skin:      Warm and dry; no rash Neuro: alert and oriented x 3 Psych: normal mood and affect Rectal exam: Normal anal sphincter tone, no anal fissure or external hemorrhoids  Data Reviewed:  Reviewed labs, radiology imaging, old records and pertinent past GI work up   Assessment and Plan/Recommendations:  32 year old female with anal fissure, chronic intermittent constipation and bright red blood per rectum Anal fissure is healing Continue nitroglycerin  per rectum 0.125% 3 times daily for additional 2 to 4 weeks Benefiber 1 teaspoon 3 times daily with meals Increase water intake We will schedule for colonoscopy for evaluation of rectal bleeding and exclude IBD or neoplastic lesion The risks and benefits as well as alternatives of endoscopic procedure(s) have been discussed and reviewed. All questions answered. The patient agrees to proceed.   Iona Beard , MD [phone]    CC: Sherren Mocha, MD

## 2018-11-28 DIAGNOSIS — F411 Generalized anxiety disorder: Secondary | ICD-10-CM | POA: Diagnosis not present

## 2018-11-29 ENCOUNTER — Encounter: Payer: Self-pay | Admitting: Gastroenterology

## 2018-12-01 ENCOUNTER — Telehealth: Payer: Self-pay | Admitting: Gastroenterology

## 2018-12-01 LAB — HORMONE PANEL (T4,TSH,FSH,TESTT,SHBG,DHEA,ETC)
DHEA-Sulfate, LCMS: 127 ug/dL
ESTRONE: 60 ng/dL
Estradiol, Serum, MS: 27 pg/mL
Follicle Stimulating Hormone: 10 m[IU]/mL
Free T-3: 3.5 pg/mL
Free Testosterone, Serum: 4.3 pg/mL
Progesterone, Serum: <10 ng/dL
Sex Hormone Binding Globulin: 28.7 nmol/L
T4: 7.1 ug/dL
TSH: 0.92 uU/mL
Testosterone, Serum (Total): 25 ng/dL
Testosterone-% Free: 1.7 %
Triiodothyronine (T-3), Serum: 128 ng/dL

## 2018-12-01 LAB — HEMOGLOBIN A1C
Est. average glucose Bld gHb Est-mCnc: 111 mg/dL
Hgb A1c MFr Bld: 5.5 % (ref 4.8–5.6)

## 2018-12-01 LAB — VITAMIN D 25 HYDROXY (VIT D DEFICIENCY, FRACTURES): Vit D, 25-Hydroxy: 39.8 ng/mL (ref 30.0–100.0)

## 2018-12-01 MED ORDER — NA SULFATE-K SULFATE-MG SULF 17.5-3.13-1.6 GM/177ML PO SOLN: 1.0000 | Freq: Once | ORAL | 0 refills | Status: AC

## 2018-12-01 NOTE — Telephone Encounter (Signed)
Suprep bowel prep 1 kit [facility] [address] in [place]. Informed pt the prep was sent and to call us back if she has any questions. She understood. Gwyndolyn Saxon in [facility]

## 2018-12-01 NOTE — Telephone Encounter (Signed)
Pt states the pre op drink has not been sent to pharm she did verify the pharm [id number] - [place], [place] - [address]

## 2018-12-02 ENCOUNTER — Telehealth: Payer: Self-pay | Admitting: Family Medicine

## 2018-12-02 NOTE — Telephone Encounter (Signed)
Pt is calling to check on the status of lab results.  Please advise

## 2018-12-04 DIAGNOSIS — G8929 Other chronic pain: Secondary | ICD-10-CM | POA: Diagnosis not present

## 2018-12-04 DIAGNOSIS — M549 Dorsalgia, unspecified: Secondary | ICD-10-CM | POA: Diagnosis not present

## 2018-12-04 DIAGNOSIS — M542 Cervicalgia: Secondary | ICD-10-CM | POA: Diagnosis not present

## 2018-12-04 DIAGNOSIS — N62 Hypertrophy of breast: Secondary | ICD-10-CM | POA: Diagnosis not present

## 2018-12-04 NOTE — Telephone Encounter (Signed)
pls see note 

## 2018-12-05 NOTE — Telephone Encounter (Signed)
Patient following up regarding lab results 

## 2018-12-18 ENCOUNTER — Encounter: Payer: BLUE CROSS/BLUE SHIELD | Admitting: Gastroenterology

## 2018-12-20 ENCOUNTER — Telehealth: Payer: Self-pay | Admitting: Family Medicine

## 2018-12-20 NOTE — Telephone Encounter (Signed)
MyChart Message sent to pt about their appointment with Dr Clelia Croft on 12/28/18

## 2018-12-21 ENCOUNTER — Other Ambulatory Visit: Payer: Self-pay | Admitting: Family Medicine

## 2018-12-21 NOTE — Telephone Encounter (Signed)
Requested medication (s) are due for refill today: yes  Requested medication (s) are on the active medication list: yes    Last refill: 11/23/18  Future visit scheduled no  Notes to clinic:not delegated  Requested Prescriptions  Pending Prescriptions Disp Refills   methylphenidate (RITALIN LA) 20 MG 24 hr capsule 30 capsule 0    Sig: Take 1 capsule (20 mg total) by mouth every morning.     Not Delegated - Psychiatry:  Stimulants/ADHD Failed - 12/21/2018  9:41 AM      Failed - This refill cannot be delegated      Failed - Urine Drug Screen completed in last 360 days.      Passed - Valid encounter within last 3 months    Recent Outpatient Visits          4 weeks ago Pendulous breast   [facility], Levell July, MD   5 months ago Class 2 severe obesity due to excess calories with serious comorbidity and body mass index (BMI) of 38.0 to 38.9 in adult [facility])   [facility], Levell July, MD   7 months ago Dyslipidemia   [facility] at [facility], Madelaine Bhat, PA-C   8 months ago Dyslipidemia   [facility], Levell July, MD   1 year ago Annual physical exam   Primary Care at [facility], Levell July, MD

## 2018-12-21 NOTE — Telephone Encounter (Unsigned)
Copied from CRM [id number]. Topic: Quick Communication - Rx Refill/Question >> Dec 21, 2018  9:14 AM Rose Cortez, Rose Cortez, NT wrote: Medication: methylphenidate (RITALIN LA) 20 MG 24 hr capsule  Has the patient contacted their pharmacy? Yes.   (Agent: If no, request that the patient contact the pharmacy for the refill.) (Agent: If yes, when and what did the pharmacy advise?)  Preferred Pharmacy (with phone number or street name[facility] [id number] [place], [place] - [address] [place] [phone] (Phone) [phone] (Fax)    Agent: Please be advised that RX refills may take up to 3 business days. We ask that you follow-up with your pharmacy.

## 2018-12-21 NOTE — Telephone Encounter (Signed)
Patient is requesting a refill of the following medications: Requested Prescriptions   Pending Prescriptions Disp Refills  . methylphenidate (RITALIN LA) 20 MG 24 hr capsule 30 capsule 0    Sig: Take 1 capsule (20 mg total) by mouth every morning.    Date of patient request: 12/21/2018 Last office visit: 11/23/2018 Date of last refill: 11/23/2018 Last refill amount: 30 Follow up time period per chart:

## 2018-12-22 ENCOUNTER — Telehealth: Payer: Self-pay | Admitting: Family Medicine

## 2018-12-22 ENCOUNTER — Other Ambulatory Visit: Payer: Self-pay | Admitting: Cardiology

## 2018-12-22 NOTE — Telephone Encounter (Signed)
Copied from CRM [id number]. Topic: Quick Communication - Rx Refill/Question >> Dec 22, 2018  3:28 PM Mcneil, Ja-Kwan wrote: Medication: methylphenidate (RITALIN LA) 20 MG 24 hr capsule  Has the patient contacted their pharmacy? yes   Preferred Pharmacy (with phone number or street name[facility] [address] [place] - [address] [place] [phone] (Phone)  [phone] (Fax)  Agent: Please be advised that RX refills may take up to 3 business days. We ask that you follow-up with your pharmacy.

## 2018-12-22 NOTE — Telephone Encounter (Signed)
Medication not delegated to NT  

## 2018-12-25 MED ORDER — METHYLPHENIDATE HCL ER (LA) 20 MG PO CP24: 20.0000 mg | ORAL_CAPSULE | ORAL | 0 refills | Status: DC

## 2018-12-25 NOTE — Telephone Encounter (Signed)
Refill x 1 mo sent in.

## 2018-12-25 NOTE — Telephone Encounter (Signed)
Sent message to MyChart with lab results.

## 2018-12-26 NOTE — Telephone Encounter (Signed)
Please advise 

## 2018-12-26 NOTE — Telephone Encounter (Signed)
pmp reviewed Medication just refilled yesterday Denying this refill Can request refill at her next appt

## 2018-12-27 DIAGNOSIS — F900 Attention-deficit hyperactivity disorder, predominantly inattentive type: Secondary | ICD-10-CM | POA: Diagnosis not present

## 2018-12-28 ENCOUNTER — Ambulatory Visit: Payer: BLUE CROSS/BLUE SHIELD | Admitting: Family Medicine

## 2018-12-28 IMAGING — US US ABDOMEN LIMITED
1 series · 14 of 25 positions shown · non-contrast
Comparison: None available

CLINICAL DATA: Transaminitis

EXAM:
LIMITED RIGHT UPPER QUADRANT ABDOMINAL ULTRASOUND
US HEPATIC DOPPLER ARTERIAL/VENOUS ULTRASOUND

[Series 1: us abdomen limited · 0.28mm/px · 14 of 92 slices shown]
[im 1/92]
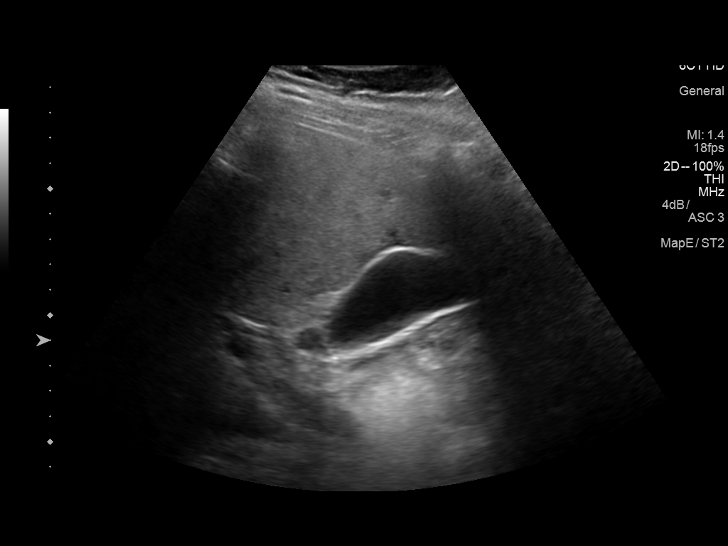
[im 8/92]
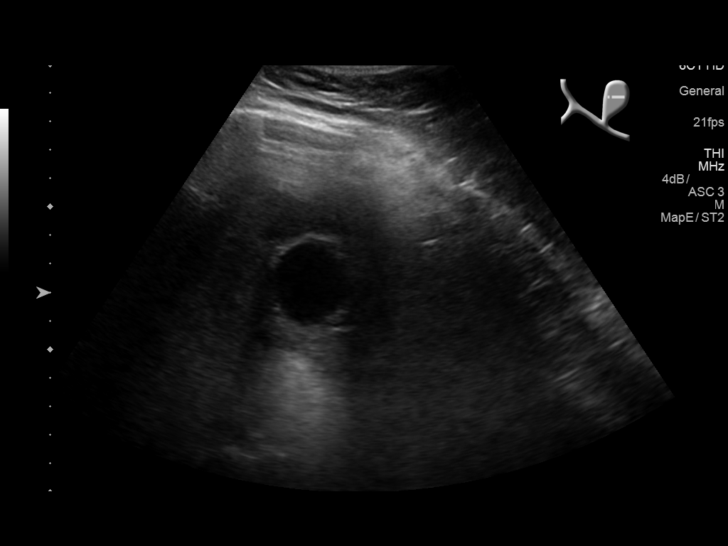
[im 16/92]
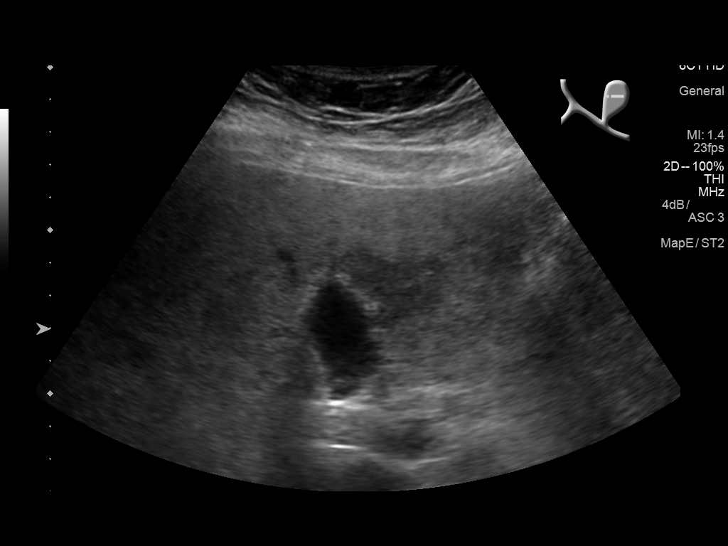
[im 23/92]
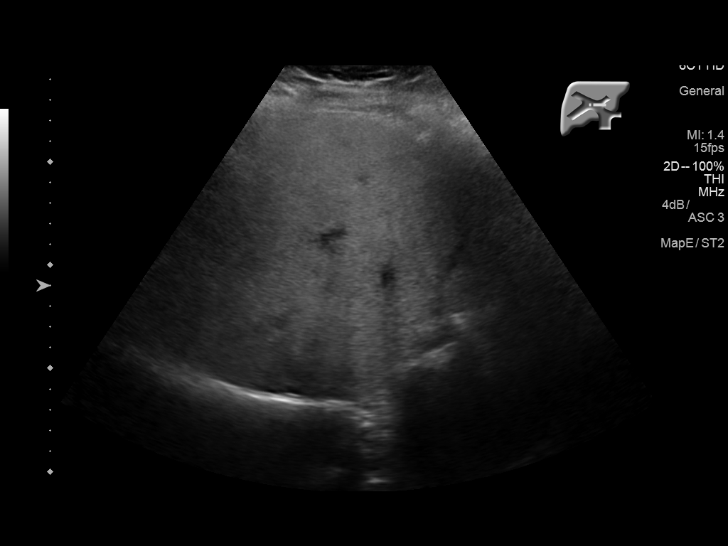
[im 31/92]
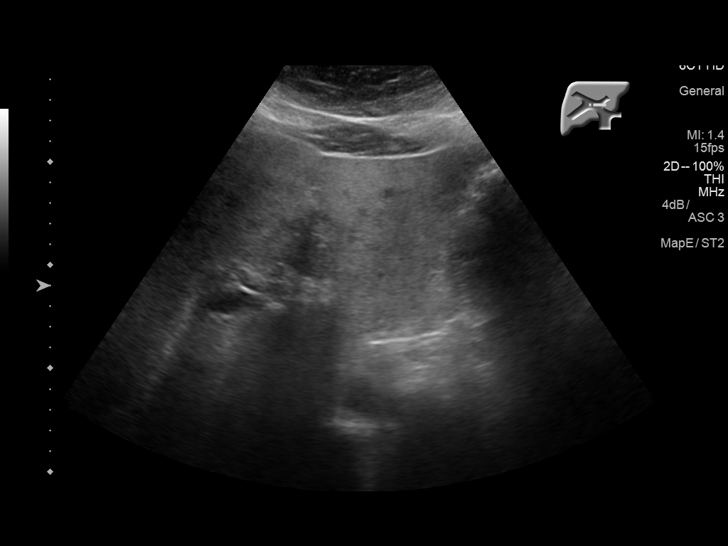
[im 35/92]
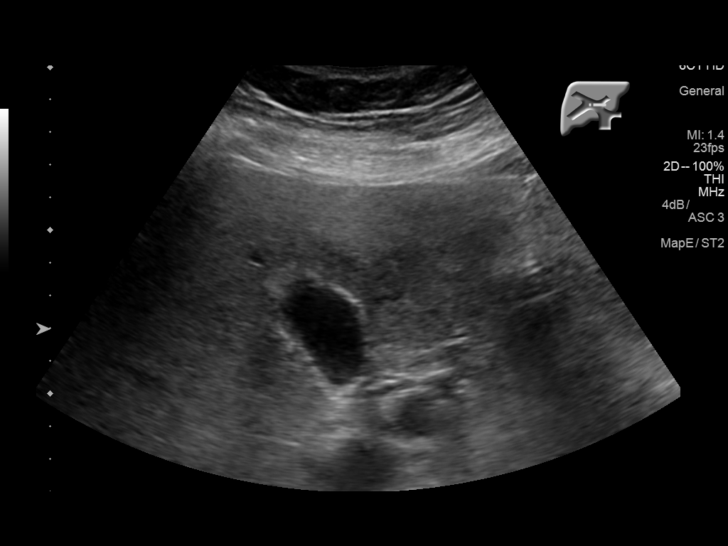
[im 42/92]
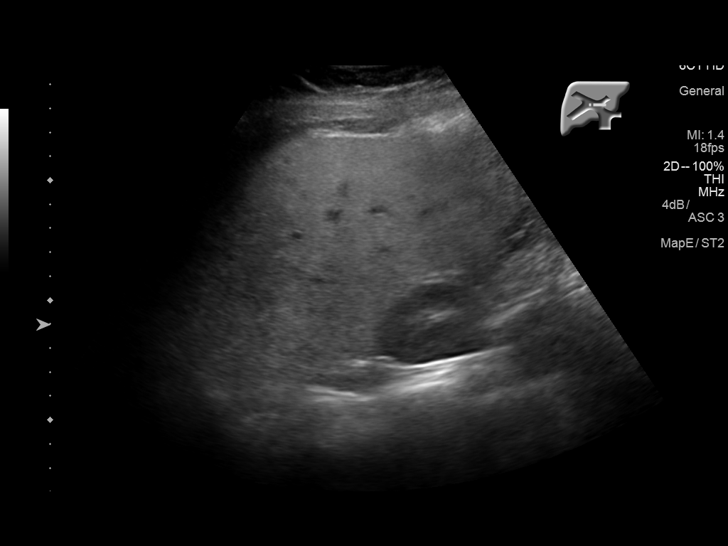
[im 50/92]
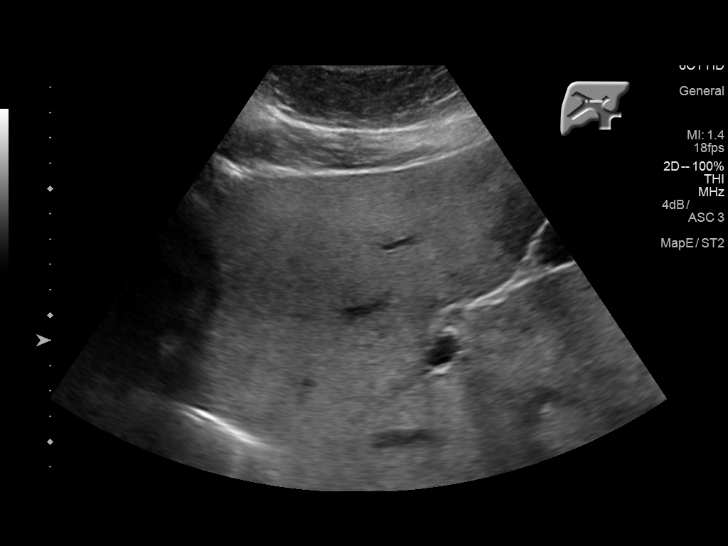
[im 57/92]
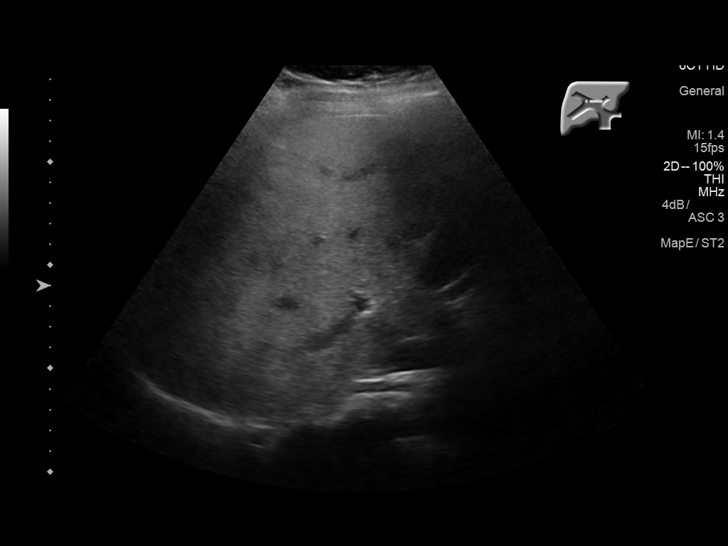
[im 61/92]
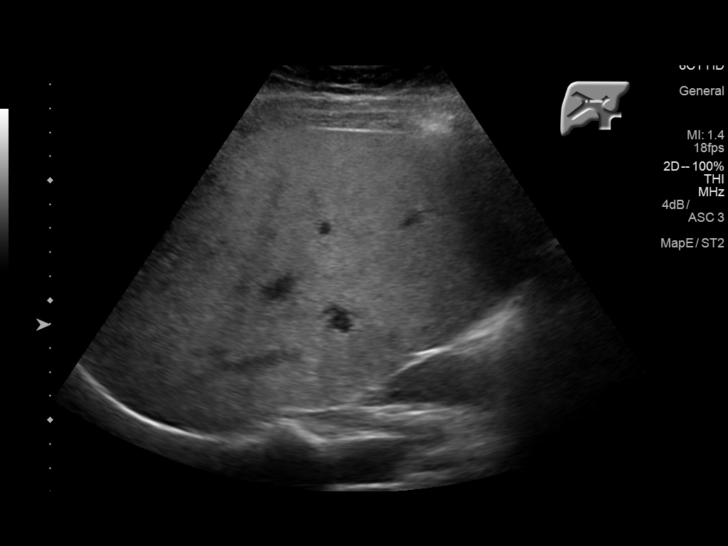
[im 69/92]
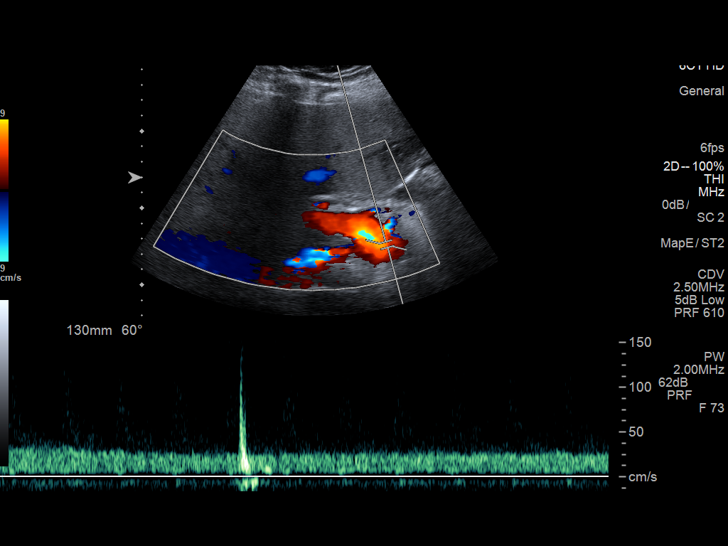
[im 76/92]
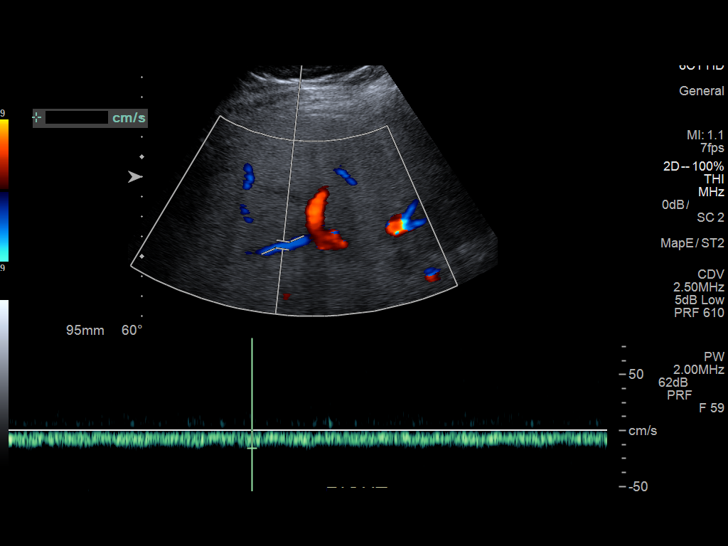
[im 84/92]
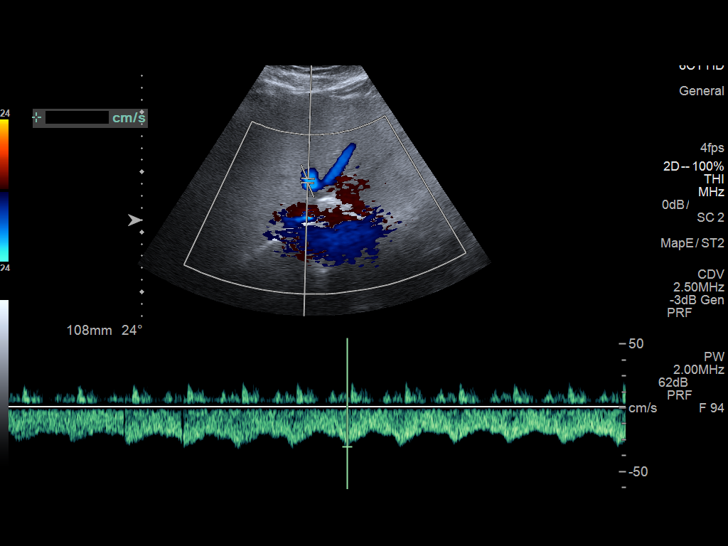
[im 92/92]
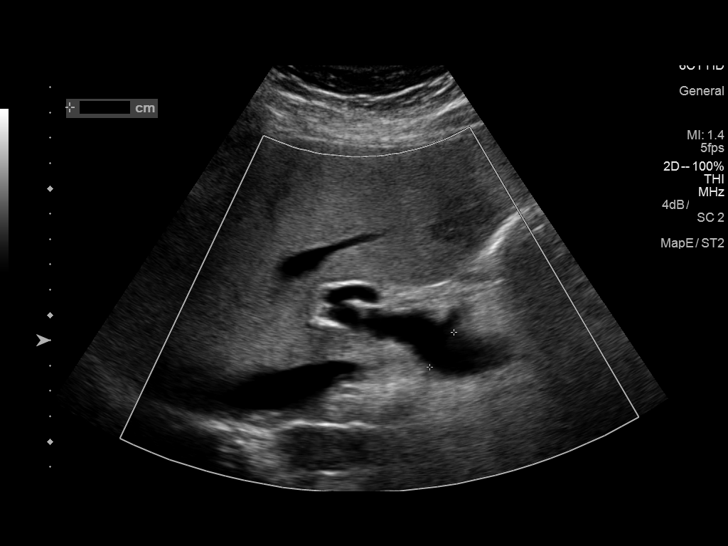

[14 of 25 positions shown; findings below may reference images not displayed]

FINDINGS: Gallbladder: Physiologically distended without stones, wall
thickening, or pericholecystic fluid. Sonographer reports no
sonographic Murphy's sign.

Common bile duct:  Normal in caliber, 2.6mm diameter.

Liver: Parenchyma is echogenic. There is a focal area of relatively
normal echogenicity adjacent to the gallbladder fossa, a
characteristic location for focal fatty sparing. No intrahepatic
biliary ductal dilatation or other focal lesion.

Portal Vein 1.7 cm diameter. No evidence of occlusion or thrombus.
Velocities (all hepatopetal):

Main:  25-34 cm/sec

Right: 15 cm/sec

Left:  13 cm/sec

Hepatic Vein Velocities (all hepatofugal):

Right:  38 cm/sec

Middle:  30 cm/sec

Left:  28 cm/sec

Hepatic Artery Velocity: 65 cm/sec

IVC:  1.5 cm at the intrahepatic level

Varices: None seen

Ascites: None seen
IMPRESSION: 1. Echogenic liver parenchyma suggesting fatty infiltration with
focal sparing near the gallbladder fossa
2. Unremarkable hepatic vascular Doppler evaluation.

## 2018-12-30 ENCOUNTER — Telehealth: Payer: Self-pay | Admitting: Family Medicine

## 2018-12-30 NOTE — Telephone Encounter (Signed)
Pt. Called to request refill on simvastatin. Pt. Is establishing care with another provider and is requesting a refill to last her until then (01/04/19).  If filled to be sent to regular pharmacy

## 2019-01-02 ENCOUNTER — Other Ambulatory Visit: Payer: Self-pay

## 2019-01-02 MED ORDER — SIMVASTATIN 40 MG PO TABS: 40.0000 mg | ORAL_TABLET | Freq: Every day | ORAL | 0 refills | Status: DC

## 2019-01-02 NOTE — Telephone Encounter (Signed)
Rx sent 

## 2019-01-04 ENCOUNTER — Ambulatory Visit: Payer: BLUE CROSS/BLUE SHIELD | Admitting: Internal Medicine

## 2019-01-04 ENCOUNTER — Encounter: Payer: Self-pay | Admitting: Internal Medicine

## 2019-01-04 VITALS — BP 110/80 | HR 85 | Temp 98.7°F | Ht 68.0 in | Wt 251.5 lb

## 2019-01-04 DIAGNOSIS — F988 Other specified behavioral and emotional disorders with onset usually occurring in childhood and adolescence: Secondary | ICD-10-CM | POA: Diagnosis not present

## 2019-01-04 DIAGNOSIS — E785 Hyperlipidemia, unspecified: Secondary | ICD-10-CM

## 2019-01-04 DIAGNOSIS — Z6838 Body mass index (BMI) 38.0-38.9, adult: Secondary | ICD-10-CM

## 2019-01-04 DIAGNOSIS — I471 Supraventricular tachycardia: Secondary | ICD-10-CM | POA: Diagnosis not present

## 2019-01-04 DIAGNOSIS — F411 Generalized anxiety disorder: Secondary | ICD-10-CM | POA: Diagnosis not present

## 2019-01-04 MED ORDER — SIMVASTATIN 40 MG PO TABS: 40.0000 mg | ORAL_TABLET | Freq: Every day | ORAL | 1 refills | Status: DC

## 2019-01-04 MED ORDER — BUPROPION HCL ER (SR) 150 MG PO TB12: ORAL_TABLET | ORAL | 1 refills | Status: DC

## 2019-01-04 MED ORDER — CITALOPRAM HYDROBROMIDE 20 MG PO TABS: ORAL_TABLET | ORAL | 1 refills | Status: DC

## 2019-01-04 MED ORDER — METHYLPHENIDATE HCL ER (LA) 20 MG PO CP24: 20.0000 mg | ORAL_CAPSULE | ORAL | 0 refills | Status: DC

## 2019-01-04 MED ORDER — METOPROLOL SUCCINATE ER 50 MG PO TB24: 50.0000 mg | ORAL_TABLET | Freq: Every day | ORAL | 1 refills | Status: DC

## 2019-01-04 MED ORDER — TRAZODONE HCL 50 MG PO TABS: ORAL_TABLET | ORAL | 3 refills | Status: DC

## 2019-01-04 NOTE — Patient Instructions (Signed)
-  It was nice meeting you today!  -Schedule follow up in 6 months.

## 2019-01-04 NOTE — Progress Notes (Signed)
New Patient Office Visit     CC/Reason for Visit: Establish care, follow-up on chronic conditions Previous PCP: Norberto Sorenson [facility]   HPI: Rose Cortez is a 32 y.o. female who is coming in today for the above mentioned reasons. Past Medical History is significant for: PSVT controlled on daily metoprolol, recent diagnosis of ADD, she tried Adderall without effect and has now been switched over to Ritalin over the past couple weeks, she has a history of depression and generalized anxiety and seems well controlled on Celexa and Wellbutrin.  She also has a history of hyperlipidemia and is on simvastatin, also has obesity.  She has been taking trazodone at bedtime but feels like it does not help and would like to wean off of it.  She is seeing [facility] and her counselor there is the one who suggested that she may have ADD.   Past Medical/Surgical History: Past Medical History:  Diagnosis Date  . Anal fissure   . Anxiety   . Depression   . History of PSVT (paroxysmal supraventricular tachycardia)    managed with Beta Blockers & Vagal Maneuvers.  Marland Kitchen HTN (hypertension)   . Hyperlipidemia   . 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 Mononucleosis 02/2017    Past Surgical History:  Procedure Laterality Date  . COLPOSCOPY  10/2017  . TRANSTHORACIC ECHOCARDIOGRAM  03/2017   Normal EF 55-60%. GR 1 DD. Normal Valves - no MVP  . WISDOM TOOTH EXTRACTION      Social History:  reports that she has never smoked. She has never used smokeless tobacco. She reports current alcohol use of about 1.0 - 2.0 standard drinks of alcohol per week. She reports that she does not use drugs.  Allergies: No Known Allergies  Family History:  Family History  Problem Relation Age of Onset  . Hyperlipidemia Mother   . Breast cancer Mother 92  . Hyperlipidemia Father   . Depression Sister   . Heart disease Maternal Grandfather   . Diabetes Maternal Grandfather   . Lung cancer  Paternal Grandfather   . Heart attack Maternal Uncle      Current Outpatient Medications:  .  buPROPion (WELLBUTRIN SR) 150 MG 12 hr tablet, TAKE 1 TABLET BY MOUTH EVERY MORNING X 2 WEEKS, THEN 2 TABLETS BY MOUTH DAILY, Disp: 180 tablet, Rfl: 1 .  cholecalciferol (VITAMIN D) 1000 UNITS tablet, Take 1,000 Units by mouth daily. , Disp: , Rfl:  .  citalopram (CELEXA) 20 MG tablet, Take one tablet by mouth daily., Disp: 90 tablet, Rfl: 1 .  methylphenidate (RITALIN LA) 20 MG 24 hr capsule, Take 1 capsule (20 mg total) by mouth every morning., Disp: 30 capsule, Rfl: 0 .  metoprolol succinate (TOPROL-XL) 50 MG 24 hr tablet, Take 1 tablet (50 mg total) by mouth daily., Disp: 90 tablet, Rfl: 1 .  Multiple Vitamins-Minerals (HAIR/SKIN/NAILS/BIOTIN PO), Take 1 tablet by mouth 2 (two) times daily., Disp: , Rfl:  .  Omega-3 Fatty Acids (FISH OIL) 1000 MG CAPS, Take by mouth daily., Disp: , Rfl:  .  simvastatin (ZOCOR) 40 MG tablet, Take 1 tablet (40 mg total) by mouth daily., Disp: 90 tablet, Rfl: 1 .  traZODone (DESYREL) 50 MG tablet, TAKE 1 BY MOUTH AT BEDTIME AS NEEDED FOR SLEEP. D/C DOXEPIN, Disp: 15 tablet, Rfl: 3 .  AMBULATORY NON FORMULARY MEDICATION, Medication Name: 0.125% Nitroglycerin ointment Use pea sized amount per rectum three times daily, Disp: 30 g, Rfl: 1 .  methylphenidate (RITALIN LA) 20 MG 24 hr capsule, Take 1 capsule (20 mg total) by mouth every morning., Disp: 30 capsule, Rfl: 0 .  methylphenidate (RITALIN LA) 20 MG 24 hr capsule, Take 1 capsule (20 mg total) by mouth every morning., Disp: 30 capsule, Rfl: 0  Review of Systems:  Constitutional: Denies fever, chills, diaphoresis, appetite change and fatigue.  HEENT: Denies photophobia, eye pain, redness, hearing loss, ear pain, congestion, sore throat, rhinorrhea, sneezing, mouth sores, trouble swallowing, neck pain, neck stiffness and tinnitus.   Respiratory: Denies SOB, DOE, cough, chest tightness,  and wheezing.   Cardiovascular:  Denies chest pain, palpitations and leg swelling.  Gastrointestinal: Denies nausea, vomiting, abdominal pain, diarrhea, constipation, blood in stool and abdominal distention.  Genitourinary: Denies dysuria, urgency, frequency, hematuria, flank pain and difficulty urinating.  Endocrine: Denies: hot or cold intolerance, sweats, changes in hair or nails, polyuria, polydipsia. Musculoskeletal: Denies myalgias, back pain, joint swelling, arthralgias and gait problem.  Skin: Denies pallor, rash and wound.  Neurological: Denies dizziness, seizures, syncope, weakness, light-headedness, numbness and headaches.  Hematological: Denies adenopathy. Easy bruising, personal or family bleeding history  Psychiatric/Behavioral: Denies suicidal ideation, mood changes, confusion, nervousness, sleep disturbance and agitation    Physical Exam: Vitals:   01/04/19 1503  BP: 110/80  Pulse: 85  Temp: 98.7 F (37.1 C)  TempSrc: Oral  SpO2: 98%  Weight: 251 lb 8 oz (114.1 kg)  Height: 5\' 8"  (1.727 m)   Body mass index is 38.24 kg/m.   Constitutional: NAD, calm, comfortable Eyes: PERRL, lids and conjunctivae normal ENMT: Mucous membranes are moist.  Respiratory: clear to auscultation bilaterally, no wheezing, no crackles. Normal respiratory effort. No accessory muscle use.  Cardiovascular: Regular rate and rhythm, no murmurs / rubs / gallops. No extremity edema. 2+ pedal pulses. No carotid bruits.  Musculoskeletal: no clubbing / cyanosis. No joint deformity upper and lower extremities. Good ROM, no contractures. Normal muscle tone.  Skin: no rashes, lesions, ulcers. No induration Neurologic: CN 2-12 grossly intact. Sensation intact, DTR normal. Strength 5/5 in all 4.  Psychiatric: Normal judgment and insight. Alert and oriented x 3. Normal mood.    Impression and Plan:  GAD (generalized anxiety disorder)  Depression -She feels like the combination of Celexa and Wellbutrin is working for her, will  refill today. -She [facility]counsel[facility].  PSVT (paroxysmal supraventricular tachycardia) (HCC)  -With occasional palpitations with exertion, plan to continue metoprolol.  Attention deficit disorder, unspecified hyperactivity presence  -She tried Adderall without improvement in focus or concentration, she started taking methylphenidate within the past 2 weeks and has not noticed any difference yet. -Have advised trial of 3 months worth of medication, if no improvement can consider weaning off of it. -[facility].  Class 2 severe obesity due to excess calories with serious comorbidity and body mass index (BMI) of 38.0 to 38.9 in adult [facility](HCC) -Healthy lifestyle has been discussed including weight loss, better food choices and increased physical activity. -May discuss referral to healthy weight and wellness at next visit.  Dyslipidemia  -Last LDL was 98 in December 2019, continue simvastatin.     Patient Instructions  -It was nice meeting you today!  -Schedule follow up in 6 months.     Chaya JanEstela Hernandez Acosta, MD [facility]

## 2019-03-12 DIAGNOSIS — F900 Attention-deficit hyperactivity disorder, predominantly inattentive type: Secondary | ICD-10-CM | POA: Diagnosis not present

## 2019-04-02 ENCOUNTER — Telehealth: Payer: Self-pay | Admitting: Internal Medicine

## 2019-04-02 NOTE — Telephone Encounter (Signed)
Patient would like a call back to discuss increasing the dosage of Ritalin or changing to another medication because she stated she is not noticing any difference. Call back [phone] (mobile)

## 2019-04-03 ENCOUNTER — Other Ambulatory Visit: Payer: Self-pay

## 2019-04-03 ENCOUNTER — Ambulatory Visit (INDEPENDENT_AMBULATORY_CARE_PROVIDER_SITE_OTHER): Payer: BLUE CROSS/BLUE SHIELD | Admitting: Internal Medicine

## 2019-04-03 DIAGNOSIS — F900 Attention-deficit hyperactivity disorder, predominantly inattentive type: Secondary | ICD-10-CM | POA: Diagnosis not present

## 2019-04-03 DIAGNOSIS — F411 Generalized anxiety disorder: Secondary | ICD-10-CM | POA: Diagnosis not present

## 2019-04-03 DIAGNOSIS — G4700 Insomnia, unspecified: Secondary | ICD-10-CM

## 2019-04-03 MED ORDER — AMPHETAMINE-DEXTROAMPHETAMINE 10 MG PO TABS: 10.0000 mg | ORAL_TABLET | Freq: Two times a day (BID) | ORAL | 0 refills | Status: DC

## 2019-04-03 NOTE — Telephone Encounter (Signed)
Doxy.me appointment made for 04/03/2019 

## 2019-04-03 NOTE — Progress Notes (Signed)
Virtual Visit via Video Note  I connected with Rose Cortez on 04/03/19 at 10:30 AM EDT by a video enabled telemedicine application and verified that I am speaking with the correct person using two identifiers.  Location patient: home Location provider: work office Persons participating in the virtual visit: patient, provider  I discussed the limitations of evaluation and management by telemedicine and the availability of in person appointments. The patient expressed understanding and agreed to proceed.   HPI: She has scheduled this visit to discuss her ADHD and medications. Her ritalin expired in March. She quit taking it because she didn't notice any difference while on it. She has not had any withdrawal symptoms either. She is still having issues with focus and concentration. She has not yet been able to see the [facility] due to the COVID-19 pandemic.  She has stopped taking trazodone on my advice. She falls asleep at around 10:30, but will usually wake up around 1 am and will be awake for a few hours. Usually she wakes up to use the bathroom. She drinks water before bedtime.  She feels her mood is stable on citalopram and wellbutrin combination.   ROS: Constitutional: Denies fever, chills, diaphoresis, appetite change and fatigue.  HEENT: Denies photophobia, eye pain, redness, hearing loss, ear pain, congestion, sore throat, rhinorrhea, sneezing, mouth sores, trouble swallowing, neck pain, neck stiffness and tinnitus.   Respiratory: Denies SOB, DOE, cough, chest tightness,  and wheezing.   Cardiovascular: Denies chest pain, palpitations and leg swelling.  Gastrointestinal: Denies nausea, vomiting, abdominal pain, diarrhea, constipation, blood in stool and abdominal distention.  Genitourinary: Denies dysuria, urgency, frequency, hematuria, flank pain and difficulty urinating.  Endocrine: Denies: hot or cold intolerance, sweats, changes in hair or nails,  polyuria, polydipsia. Musculoskeletal: Denies myalgias, back pain, joint swelling, arthralgias and gait problem.  Skin: Denies pallor, rash and wound.  Neurological: Denies dizziness, seizures, syncope, weakness, light-headedness, numbness and headaches.  Hematological: Denies adenopathy. Easy bruising, personal or family bleeding history  Psychiatric/Behavioral: Denies suicidal ideation, mood changes, confusion, nervousness and agitation   Past Medical History:  Diagnosis Date  . Anal fissure   . Anxiety   . Depression   . History of PSVT (paroxysmal supraventricular tachycardia)    managed with Beta Blockers & Vagal Maneuvers.  Marland Kitchen. HTN (hypertension)   . Hyperlipidemia   . 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 Mononucleosis 02/2017    Past Surgical History:  Procedure Laterality Date  . COLPOSCOPY  10/2017  . TRANSTHORACIC ECHOCARDIOGRAM  03/2017   Normal EF 55-60%. GR 1 DD. Normal Valves - no MVP  . WISDOM TOOTH EXTRACTION      Family History  Problem Relation Age of Onset  . Hyperlipidemia Mother   . Breast cancer Mother [id number]  . Hyperlipidemia Father   . Depression Sister   . Heart disease Maternal Grandfather   . Diabetes Maternal Grandfather   . Lung cancer Paternal Grandfather   . Heart attack Maternal Uncle     SOCIAL HX:   reports that she has never smoked. She has never used smokeless tobacco. She reports current alcohol use of about 1.0 - 2.0 standard drinks of alcohol per week. She reports that she does not use drugs.   Current Outpatient Medications:  .  AMBULATORY NON FORMULARY MEDICATION, Medication Name: 0.125% Nitroglycerin ointment Use pea sized amount per rectum three times daily, Disp: 30 g, Rfl: 1 .  amphetamine-dextroamphetamine (ADDERALL) 10 MG tablet,  Take 1 tablet (10 mg total) by mouth 2 (two) times daily., Disp: 60 tablet, Rfl: 0 .  amphetamine-dextroamphetamine (ADDERALL) 10 MG tablet, Take 1 tablet (10 mg total) by mouth 2 (two) times  daily., Disp: 60 tablet, Rfl: 0 .  amphetamine-dextroamphetamine (ADDERALL) 10 MG tablet, Take 1 tablet (10 mg total) by mouth 2 (two) times daily., Disp: 60 tablet, Rfl: 0 .  buPROPion (WELLBUTRIN SR) 150 MG 12 hr tablet, TAKE 1 TABLET BY MOUTH EVERY MORNING X 2 WEEKS, THEN 2 TABLETS BY MOUTH DAILY, Disp: 180 tablet, Rfl: 1 .  cholecalciferol (VITAMIN D) 1000 UNITS tablet, Take 1,000 Units by mouth daily. , Disp: , Rfl:  .  citalopram (CELEXA) 20 MG tablet, Take one tablet by mouth daily., Disp: 90 tablet, Rfl: 1 .  metoprolol succinate (TOPROL-XL) 50 MG 24 hr tablet, Take 1 tablet (50 mg total) by mouth daily., Disp: 90 tablet, Rfl: 1 .  Multiple Vitamins-Minerals (HAIR/SKIN/NAILS/BIOTIN PO), Take 1 tablet by mouth 2 (two) times daily., Disp: , Rfl:  .  Omega-3 Fatty Acids (FISH OIL) 1000 MG CAPS, Take by mouth daily., Disp: , Rfl:  .  simvastatin (ZOCOR) 40 MG tablet, Take 1 tablet (40 mg total) by mouth daily., Disp: 90 tablet, Rfl: 1 .  traZODone (DESYREL) 50 MG tablet, TAKE 1 BY MOUTH AT BEDTIME AS NEEDED FOR SLEEP. D/C DOXEPIN, Disp: 15 tablet, Rfl: 3  EXAM:   VITALS per patient if applicable: none reported  GENERAL: alert, oriented, appears well and in no acute distress  HEENT: atraumatic, conjunttiva clear, no obvious abnormalities on inspection of external nose and ears  NECK: normal movements of the head and neck  LUNGS: on inspection no signs of respiratory distress, breathing rate appears normal, no obvious gross increased work of breathing, gasping or wheezing  CV: no obvious cyanosis  MS: moves all visible extremities without noticeable abnormality  PSYCH/NEURO: pleasant and cooperative, no obvious depression or anxiety, speech and thought processing grossly intact  ASSESSMENT AND PLAN:   GAD (generalized anxiety disorder) -Mood is stable on celexa/wellbutrin combo. No changes.  Attention deficit hyperactivity disorder (ADHD), predominantly inattentive type   -Ritalin without effect. -Will go back to Adderall 10 mg BID. We can adjust dosing as we see fit. -Will give 3 month refills today.  Insomnia, unspecified type -She has quit taking trazodone. -We have discussed sleep hygiene techniques. She is currently drinking water right before bedtime and has to wake up around 1 am to use the restroom and then she is awake for a few hours before she can fall back asleep. -Can consider re-initiating trazodone, but try sleep hygiene first.     I discussed the assessment and treatment plan with the patient. The patient was provided an opportunity to ask questions and all were answered. The patient agreed with the plan and demonstrated an understanding of the instructions.   The patient was advised to call back or seek an in-person evaluation if the symptoms worsen or if the condition fails to improve as anticipated.    Chaya Jan, MD  [facility] at [facility]

## 2019-04-12 ENCOUNTER — Telehealth: Payer: Self-pay | Admitting: *Deleted

## 2019-04-12 NOTE — Telephone Encounter (Signed)
E visit to discuss please.

## 2019-04-12 NOTE — Telephone Encounter (Signed)
Copied from CRM [id number]. Topic: Quick Communication - Rx Refill/Question >> Apr 12, 2019 12:18 PM Crist Infante wrote: Medication: amphetamine-dextroamphetamine (ADDERALL) 10 MG tablet  BID Pt states she is still having problem with concentration and is not "feeling anything". Pt is concerned she is not on the correct dosage. Would like a call back

## 2019-04-13 ENCOUNTER — Ambulatory Visit (INDEPENDENT_AMBULATORY_CARE_PROVIDER_SITE_OTHER): Payer: BLUE CROSS/BLUE SHIELD | Admitting: Internal Medicine

## 2019-04-13 ENCOUNTER — Other Ambulatory Visit: Payer: Self-pay

## 2019-04-13 DIAGNOSIS — F900 Attention-deficit hyperactivity disorder, predominantly inattentive type: Secondary | ICD-10-CM

## 2019-04-13 NOTE — Telephone Encounter (Signed)
Patient will check with her insurance and call back to schedule a doxy.me appointment.

## 2019-04-13 NOTE — Progress Notes (Signed)
Virtual Visit via Video Note  I connected with Rose Cortez on 04/13/19 at 11:00 AM EDT by a video enabled telemedicine application and verified that I am speaking with the correct person using two identifiers.  Location patient: home Location provider: work office Persons participating in the virtual visit: patient, provider  I discussed the limitations of evaluation and management by telemedicine and the availability of in person appointments. The patient expressed understanding and agreed to proceed.   HPI: She scheduled this visit to discuss Adderall dosing. She was just started on 10 mg BID 1 week ago. She weaned off her Ritalin before that as it wasn't effective. She will be seeing [facility] but they are not seeing new patients due to COVID-19 restrictions. States she hasn't noticed any difference with Adderall yet. She has been painting as a stress reliever and notes that she will frequently "zone off".   ROS: Constitutional: Denies fever, chills, diaphoresis, appetite change and fatigue.  HEENT: Denies photophobia, eye pain, redness, hearing loss, ear pain, congestion, sore throat, rhinorrhea, sneezing, mouth sores, trouble swallowing, neck pain, neck stiffness and tinnitus.   Respiratory: Denies SOB, DOE, cough, chest tightness,  and wheezing.   Cardiovascular: Denies chest pain, palpitations and leg swelling.  Gastrointestinal: Denies nausea, vomiting, abdominal pain, diarrhea, constipation, blood in stool and abdominal distention.  Genitourinary: Denies dysuria, urgency, frequency, hematuria, flank pain and difficulty urinating.  Endocrine: Denies: hot or cold intolerance, sweats, changes in hair or nails, polyuria, polydipsia. Musculoskeletal: Denies myalgias, back pain, joint swelling, arthralgias and gait problem.  Skin: Denies pallor, rash and wound.  Neurological: Denies dizziness, seizures, syncope, weakness, light-headedness, numbness  and headaches.  Hematological: Denies adenopathy. Easy bruising, personal or family bleeding history  Psychiatric/Behavioral: Denies suicidal ideation, mood changes, confusion, nervousness, sleep disturbance and agitation   Past Medical History:  Diagnosis Date  . Anal fissure   . Anxiety   . Depression   . History of PSVT (paroxysmal supraventricular tachycardia)    managed with Beta Blockers & Vagal Maneuvers.  Marland Kitchen. HTN (hypertension)   . Hyperlipidemia   . 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 Mononucleosis 02/2017    Past Surgical History:  Procedure Laterality Date  . COLPOSCOPY  10/2017  . TRANSTHORACIC ECHOCARDIOGRAM  03/2017   Normal EF 55-60%. GR 1 DD. Normal Valves - no MVP  . WISDOM TOOTH EXTRACTION      Family History  Problem Relation Age of Onset  . Hyperlipidemia Mother   . Breast cancer Mother 1738  . Hyperlipidemia Father   . Depression Sister   . Heart disease Maternal Grandfather   . Diabetes Maternal Grandfather   . Lung cancer Paternal Grandfather   . Heart attack Maternal Uncle     SOCIAL HX:   reports that she has never smoked. She has never used smokeless tobacco. She reports current alcohol use of about 1.0 - 2.0 standard drinks of alcohol per week. She reports that she does not use drugs.   Current Outpatient Medications:  .  AMBULATORY NON FORMULARY MEDICATION, Medication Name: 0.125% Nitroglycerin ointment Use pea sized amount per rectum three times daily, Disp: 30 g, Rfl: 1 .  amphetamine-dextroamphetamine (ADDERALL) 10 MG tablet, Take 1 tablet (10 mg total) by mouth 2 (two) times daily., Disp: 60 tablet, Rfl: 0 .  amphetamine-dextroamphetamine (ADDERALL) 10 MG tablet, Take 1 tablet (10 mg total) by mouth 2 (two) times daily., Disp: 60 tablet, Rfl: 0 .  amphetamine-dextroamphetamine (  ADDERALL) 10 MG tablet, Take 1 tablet (10 mg total) by mouth 2 (two) times daily., Disp: 60 tablet, Rfl: 0 .  buPROPion (WELLBUTRIN SR) 150 MG 12 hr tablet, TAKE  1 TABLET BY MOUTH EVERY MORNING X 2 WEEKS, THEN 2 TABLETS BY MOUTH DAILY, Disp: 180 tablet, Rfl: 1 .  cholecalciferol (VITAMIN D) 1000 UNITS tablet, Take 1,000 Units by mouth daily. , Disp: , Rfl:  .  citalopram (CELEXA) 20 MG tablet, Take one tablet by mouth daily., Disp: 90 tablet, Rfl: 1 .  metoprolol succinate (TOPROL-XL) 50 MG 24 hr tablet, Take 1 tablet (50 mg total) by mouth daily., Disp: 90 tablet, Rfl: 1 .  Multiple Vitamins-Minerals (HAIR/SKIN/NAILS/BIOTIN PO), Take 1 tablet by mouth 2 (two) times daily., Disp: , Rfl:  .  Omega-3 Fatty Acids (FISH OIL) 1000 MG CAPS, Take by mouth daily., Disp: , Rfl:  .  simvastatin (ZOCOR) 40 MG tablet, Take 1 tablet (40 mg total) by mouth daily., Disp: 90 tablet, Rfl: 1 .  traZODone (DESYREL) 50 MG tablet, TAKE 1 BY MOUTH AT BEDTIME AS NEEDED FOR SLEEP. D/C DOXEPIN, Disp: 15 tablet, Rfl: 3  EXAM:   VITALS per patient if applicable: none reported  GENERAL: alert, oriented, appears well and in no acute distress  HEENT: atraumatic, conjunttiva clear, no obvious abnormalities on inspection of external nose and ears  NECK: normal movements of the head and neck  LUNGS: on inspection no signs of respiratory distress, breathing rate appears normal, no obvious gross increased work of breathing, gasping or wheezing  CV: no obvious cyanosis  MS: moves all visible extremities without noticeable abnormality  PSYCH/NEURO: pleasant and cooperative, no obvious depression or anxiety, speech and thought processing grossly intact  ASSESSMENT AND PLAN:   Attention deficit hyperactivity disorder (ADHD), predominantly inattentive type -I think 1 week is too short of a time to have noticed any significant difference. -Have asked her to give it a full 4 weeks before we decide to make any dosing changes; she agrees and will contact us in 2-3 weeks if still no change.    I discussed the assessment and treatment plan with the patient. The patient was provided an  opportunity to ask questions and all were answered. The patient agreed with the plan and demonstrated an understanding of the instructions.   The patient was advised to call back or seek an in-person evaluation if the symptoms worsen or if the condition fails to improve as anticipated.    Chaya Jan, MD  [facility] at [facility]

## 2019-05-18 ENCOUNTER — Ambulatory Visit: Payer: Self-pay | Admitting: Internal Medicine

## 2019-05-23 ENCOUNTER — Other Ambulatory Visit: Payer: Self-pay

## 2019-05-23 ENCOUNTER — Ambulatory Visit (INDEPENDENT_AMBULATORY_CARE_PROVIDER_SITE_OTHER): Payer: BC Managed Care – PPO | Admitting: Internal Medicine

## 2019-05-23 DIAGNOSIS — F900 Attention-deficit hyperactivity disorder, predominantly inattentive type: Secondary | ICD-10-CM

## 2019-05-23 DIAGNOSIS — R5383 Other fatigue: Secondary | ICD-10-CM | POA: Diagnosis not present

## 2019-05-23 NOTE — Progress Notes (Signed)
Virtual Visit via Video Note  I connected with Rose KarvonenMarie Rose Cortez on 05/23/19 at 11:00 AM EDT by a video enabled telemedicine application and verified that I am speaking with the correct person using two identifiers.  Location patient: home Location provider: work office Persons participating in the virtual visit: patient, provider  I discussed the limitations of evaluation and management by telemedicine and the availability of in person appointments. The patient expressed understanding and agreed to proceed.   HPI: This is a scheduled visit to follow-up on ADHD management.  About 6 weeks ago we started her on Adderall 10 mg daily after she had weaned herself off Ritalin.  She noticed no significant improvement while on it and in fact discontinued it about 10 days ago and has not noticed any significant difference.  She has an appointment to see the [facility] on June 29 (this consultation had been postponed due to the COVID-19 pandemic).  She wonders if she might have sleep apnea.  Her father states that she snores very hard, she finds herself waking up several times during the night with her heart racing, also states that she gets tired frequently during the day and "feels foggy".  She is overweight.   ROS: Constitutional: Denies fever, chills, diaphoresis, appetite change and fatigue.  HEENT: Denies photophobia, eye pain, redness, hearing loss, ear pain, congestion, sore throat, rhinorrhea, sneezing, mouth sores, trouble swallowing, neck pain, neck stiffness and tinnitus.   Respiratory: Denies SOB, DOE, cough, chest tightness,  and wheezing.   Cardiovascular: Denies chest pain, palpitations and leg swelling.  Gastrointestinal: Denies nausea, vomiting, abdominal pain, diarrhea, constipation, blood in stool and abdominal distention.  Genitourinary: Denies dysuria, urgency, frequency, hematuria, flank pain and difficulty urinating.  Endocrine: Denies: hot or cold  intolerance, sweats, changes in hair or nails, polyuria, polydipsia. Musculoskeletal: Denies myalgias, back pain, joint swelling, arthralgias and gait problem.  Skin: Denies pallor, rash and wound.  Neurological: Denies dizziness, seizures, syncope, weakness, light-headedness, numbness and headaches.  Hematological: Denies adenopathy. Easy bruising, personal or family bleeding history  Psychiatric/Behavioral: Denies suicidal ideation, mood changes, confusion, nervousness, sleep disturbance and agitation   Past Medical History:  Diagnosis Date  . Anal fissure   . Anxiety   . Depression   . History of PSVT (paroxysmal supraventricular tachycardia)    managed with Beta Blockers & Vagal Maneuvers.  Marland Kitchen. HTN (hypertension)   . Hyperlipidemia   . 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 Mononucleosis 02/2017    Past Surgical History:  Procedure Laterality Date  . COLPOSCOPY  10/2017  . TRANSTHORACIC ECHOCARDIOGRAM  03/2017   Normal EF 55-60%. GR 1 DD. Normal Valves - no MVP  . WISDOM TOOTH EXTRACTION      Family History  Problem Relation Age of Onset  . Hyperlipidemia Mother   . Breast cancer Mother 6538  . Hyperlipidemia Father   . Depression Sister   . Heart disease Maternal Grandfather   . Diabetes Maternal Grandfather   . Lung cancer Paternal Grandfather   . Heart attack Maternal Uncle     SOCIAL HX:   reports that she has never smoked. She has never used smokeless tobacco. She reports current alcohol use of about 1.0 - 2.0 standard drinks of alcohol per week. She reports that she does not use drugs.   Current Outpatient Medications:  .  AMBULATORY NON FORMULARY MEDICATION, Medication Name: 0.125% Nitroglycerin ointment Use pea sized amount per rectum three times daily, Disp:  30 g, Rfl: 1 .  buPROPion (WELLBUTRIN SR) 150 MG 12 hr tablet, TAKE 1 TABLET BY MOUTH EVERY MORNING X 2 WEEKS, THEN 2 TABLETS BY MOUTH DAILY, Disp: 180 tablet, Rfl: 1 .  cholecalciferol (VITAMIN D) 1000  UNITS tablet, Take 1,000 Units by mouth daily. , Disp: , Rfl:  .  citalopram (CELEXA) 20 MG tablet, Take one tablet by mouth daily., Disp: 90 tablet, Rfl: 1 .  metoprolol succinate (TOPROL-XL) 50 MG 24 hr tablet, Take 1 tablet (50 mg total) by mouth daily., Disp: 90 tablet, Rfl: 1 .  Multiple Vitamins-Minerals (HAIR/SKIN/NAILS/BIOTIN PO), Take 1 tablet by mouth 2 (two) times daily., Disp: , Rfl:  .  Omega-3 Fatty Acids (FISH OIL) 1000 MG CAPS, Take by mouth daily., Disp: , Rfl:  .  simvastatin (ZOCOR) 40 MG tablet, Take 1 tablet (40 mg total) by mouth daily., Disp: 90 tablet, Rfl: 1 .  traZODone (DESYREL) 50 MG tablet, TAKE 1 BY MOUTH AT BEDTIME AS NEEDED FOR SLEEP. D/C DOXEPIN, Disp: 15 tablet, Rfl: 3  EXAM:   VITALS per patient if applicable: None reported  GENERAL: alert, oriented, appears well and in no acute distress  HEENT: atraumatic, conjunttiva clear, no obvious abnormalities on inspection of external nose and ears  NECK: normal movements of the head and neck  LUNGS: on inspection no signs of respiratory distress, breathing rate appears normal, no obvious gross increased work of breathing, gasping or wheezing  CV: no obvious cyanosis  MS: moves all visible extremities without noticeable abnormality  PSYCH/NEURO: pleasant and cooperative, no obvious depression or anxiety, speech and thought processing grossly intact  ASSESSMENT AND PLAN:   Fatigue, unspecified type  -With daytime fatigue, frequent nighttime awakenings, her being overweight and her snoring I think my suspicion for obstructive sleep apnea is high. -Will refer to pulmonary for consideration of a sleep study. -Unclear what impact, if any, OSA would have on her ADHD.  Attention deficit hyperactivity disorder (ADHD), predominantly inattentive type -She would prefer to stay off medications for now until she sees the ADHD specialist, as she believes that sleep apnea might be causing a lot of her ADHD symptoms.      I discussed the assessment and treatment plan with the patient. The patient was provided an opportunity to ask questions and all were answered. The patient agreed with the plan and demonstrated an understanding of the instructions.   The patient was advised to call back or seek an in-person evaluation if the symptoms worsen or if the condition fails to improve as anticipated.    Lelon Frohlich, MD  [facility] at [facility]

## 2019-05-29 ENCOUNTER — Telehealth: Payer: Self-pay | Admitting: Internal Medicine

## 2019-05-29 DIAGNOSIS — E785 Hyperlipidemia, unspecified: Secondary | ICD-10-CM

## 2019-05-29 DIAGNOSIS — F411 Generalized anxiety disorder: Secondary | ICD-10-CM

## 2019-05-29 DIAGNOSIS — I471 Supraventricular tachycardia: Secondary | ICD-10-CM

## 2019-05-29 MED ORDER — CITALOPRAM HYDROBROMIDE 20 MG PO TABS: ORAL_TABLET | ORAL | 1 refills | Status: DC

## 2019-05-29 MED ORDER — BUPROPION HCL ER (SR) 150 MG PO TB12: ORAL_TABLET | ORAL | 1 refills | Status: DC

## 2019-05-29 MED ORDER — SIMVASTATIN 40 MG PO TABS: 40.0000 mg | ORAL_TABLET | Freq: Every day | ORAL | 1 refills | Status: DC

## 2019-05-29 MED ORDER — METOPROLOL SUCCINATE ER 50 MG PO TB24: 50.0000 mg | ORAL_TABLET | Freq: Every day | ORAL | 1 refills | Status: DC

## 2019-05-29 NOTE — Telephone Encounter (Signed)
Copied from [id number]. Topic: Quick Communication - Rx Refill/Question >> May 29, 2019  1:48 PM Sheran Luz wrote: Medication: simvastatin (ZOCOR) 40 MG tablet, metoprolol succinate (TOPROL-XL) 50 MG 24 hr tablet, citalopram (CELEXA) 20 MG tablet and buPROPion (WELLBUTRIN SR) 150 MG 12 hr tablet   Patient inquired if a 3 day supply of these medications can be sent to pharmacy below, as patient is currently out of town. Please advise.   Preferred Pharmacy (with phone number or street name):[id number] - [place], [place] [address]  [phone] (Phone) [phone] (Fax)

## 2019-06-19 ENCOUNTER — Other Ambulatory Visit: Payer: Self-pay

## 2019-06-19 ENCOUNTER — Ambulatory Visit (INDEPENDENT_AMBULATORY_CARE_PROVIDER_SITE_OTHER): Payer: 59 | Admitting: Pulmonary Disease

## 2019-06-19 ENCOUNTER — Encounter: Payer: Self-pay | Admitting: Pulmonary Disease

## 2019-06-19 VITALS — BP 132/74 | HR 80 | Temp 98.1°F | Ht 68.0 in | Wt 284.8 lb

## 2019-06-19 DIAGNOSIS — Z87898 Personal history of other specified conditions: Secondary | ICD-10-CM

## 2019-06-19 DIAGNOSIS — R5383 Other fatigue: Secondary | ICD-10-CM | POA: Diagnosis not present

## 2019-06-19 NOTE — Patient Instructions (Signed)
History of snoring Increasing weight  Moderate probability of significant sleep disordered breathing  We will order home sleep study Treatment options as discussed  We will see back in the office in about 3 months-this is to assess how you are tolerating CPAP and compliance Call with significant concerns

## 2019-06-19 NOTE — Addendum Note (Signed)
Addended by: Joella Prince on: 06/19/2019 09:50 AM   Modules accepted: Orders

## 2019-06-19 NOTE — Progress Notes (Signed)
Subjective:     Patient ID: Rose Cortez, female   DOB: 1986/09/25, 33 y.o.   MRN: [id number]  Patient with a history of snoring  Increasing weight over time Nocturnal SVTs  Admits to loud snoring Has gained about 30 pounds in the last 6 months No history of thyroid problems No dryness of her mouth in the mornings Occasional morning headaches Memory is fine  Usually goes to bed between 10 and 11 PM Wakes up between 7 and 9 AM  Dad and mom snore  Recent diagnosis of ADHD       Review of Systems  HENT: Negative.   Eyes: Negative.   Respiratory: Negative.   Cardiovascular: Positive for palpitations.  Gastrointestinal: Negative.   Endocrine: Negative.   Genitourinary: Negative.   Musculoskeletal: Negative.   Skin: Negative.   Allergic/Immunologic: Negative.   Neurological: Negative.   Hematological: Negative.   Psychiatric/Behavioral: Positive for dysphoric mood and sleep disturbance. The patient is nervous/anxious.    Past Medical History:  Diagnosis Date  . Anal fissure   . Anxiety   . Depression   . History of PSVT (paroxysmal supraventricular tachycardia)    managed with Beta Blockers & Vagal Maneuvers.  Marland Kitchen HTN (hypertension)   . Hyperlipidemia   . 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 Mononucleosis 02/2017   Social History   Socioeconomic History  . Marital status: Single    Spouse name: Not on file  . Number of children: 0  . Years of education: Not on file  . Highest education level: Not on file  Occupational History  . Occupation: Pharmacist, hospital  Social Needs  . Financial resource strain: Not on file  . Food insecurity    Worry: Not on file    Inability: Not on file  . Transportation needs    Medical: Not on file    Non-medical: Not on file  Tobacco Use  . Smoking status: Never Smoker  . Smokeless tobacco: Never Used  Substance and Sexual Activity  . Alcohol use: Yes    Alcohol/week: 1.0 - 2.0 standard drinks    Types: 1 - 2 Standard  drinks or equivalent per week  . Drug use: No  . Sexual activity: Not Currently    Birth control/protection: Abstinence  Lifestyle  . Physical activity    Days per week: Not on file    Minutes per session: Not on file  . Stress: Not on file  Relationships  . Social Herbalist on phone: Not on file    Gets together: Not on file    Attends religious service: Not on file    Active member of club or organization: Not on file    Attends meetings of clubs or organizations: Not on file    Relationship status: Not on file  . Intimate partner violence    Fear of current or ex partner: Not on file    Emotionally abused: Not on file    Physically abused: Not on file    Forced sexual activity: Not on file  Other Topics Concern  . Not on file  Social History Narrative  . Not on file   Family History  Problem Relation Age of Onset  . Hyperlipidemia Mother   . Breast cancer Mother 23  . Hyperlipidemia Father   . Depression Sister   . Heart disease Maternal Grandfather   . Diabetes Maternal Grandfather   . Lung cancer Paternal Grandfather   . Heart attack  Maternal Uncle        Objective:   Physical Exam Constitutional:      Appearance: Normal appearance. She is obese.  HENT:     Head: Normocephalic and atraumatic.  Eyes:     General:        Right eye: No discharge.        Left eye: No discharge.     Extraocular Movements: Extraocular movements intact.     Pupils: Pupils are equal, round, and reactive to light.  Neck:     Musculoskeletal: Normal range of motion and neck supple. No neck rigidity or muscular tenderness.  Cardiovascular:     Rate and Rhythm: Normal rate and regular rhythm.     Pulses: Normal pulses.     Heart sounds: No murmur.  Pulmonary:     Effort: Pulmonary effort is normal. No respiratory distress.     Breath sounds: Normal breath sounds. No stridor. No wheezing or rhonchi.  Abdominal:     General: There is no distension.  Musculoskeletal:  Normal range of motion.        General: No swelling.  Lymphadenopathy:     Cervical: No cervical adenopathy.  Skin:    General: Skin is warm and dry.     Coloration: Skin is not jaundiced or pale.  Neurological:     General: No focal deficit present.     Mental Status: She is alert.    Vitals:   06/19/19 0903  BP: 132/74  Pulse: 80  Temp: 98.1 F (36.7 C)  SpO2: 99%   Results of the Epworth flowsheet 06/19/2019  Sitting and reading 0  Watching TV 0  Sitting, inactive in a public place (e.g. a theatre or a meeting) 0  As a passenger in a car for an hour without a break 0  Lying down to rest in the afternoon when circumstances permit 1  Sitting and talking to someone 0  Sitting quietly after a lunch without alcohol 0  In a car, while stopped for a few minutes in traffic 0  Total score 1      Assessment:     History of snoring -Longstanding history of snoring -Denies significant daytime sleepiness  Morbid obesity -Progressive weight gain  Moderate to high probability of significant sleep disordered breathing  History of depression/ADHD    Plan:     Order home sleep study  Options of treatment of sleep disordered breathing discussed with the patient  Pathophysiology of sleep disordered breathing discussed with the patient  Significance of weight loss and exercise discussed  I will see her back in the office in about 3 months Encouraged to call with any significant concerns

## 2019-06-21 ENCOUNTER — Other Ambulatory Visit: Payer: Self-pay

## 2019-06-21 ENCOUNTER — Ambulatory Visit: Payer: 59

## 2019-06-21 DIAGNOSIS — Z87898 Personal history of other specified conditions: Secondary | ICD-10-CM

## 2019-06-21 DIAGNOSIS — G4733 Obstructive sleep apnea (adult) (pediatric): Secondary | ICD-10-CM

## 2019-06-21 DIAGNOSIS — R5383 Other fatigue: Secondary | ICD-10-CM

## 2019-06-26 DIAGNOSIS — G4733 Obstructive sleep apnea (adult) (pediatric): Secondary | ICD-10-CM

## 2019-06-27 ENCOUNTER — Telehealth: Payer: Self-pay | Admitting: Pulmonary Disease

## 2019-06-27 DIAGNOSIS — G4733 Obstructive sleep apnea (adult) (pediatric): Secondary | ICD-10-CM

## 2019-06-27 NOTE — Telephone Encounter (Signed)
Results from home sleep study-  Dr. Ander Slade has reviewed the home sleep test this showed severe sleep apnea.   Recommendations   Treatment options are CPAP with the settings auto 5 to 15.    Weight loss measures .   Advise against driving while sleepy & against medication with sedative side effects.    Make appointment for 3 months for compliance with download with Dr. Ander Slade.   Called and spoke with Patient.  Dr Ander Slade recommendations given.  Understanding stated.  DME cpap ordered placed.  Patient instructed to schedule OV with AO or NP within 3 months of starting cpap for compliance. Nothing further at this time.

## 2019-06-28 ENCOUNTER — Telehealth: Payer: Self-pay | Admitting: Pulmonary Disease

## 2019-06-28 ENCOUNTER — Telehealth: Payer: Self-pay

## 2019-06-28 NOTE — Telephone Encounter (Signed)
Attempted to call pt but line went straight to VM. I have left pt a detailed message at her request letting her know that she will be set up with a DME and they will be the ones who will let her know what her cost will be for the cpap. Nothing further needed.

## 2019-06-28 NOTE — Telephone Encounter (Signed)
error 

## 2019-07-16 ENCOUNTER — Other Ambulatory Visit: Payer: Self-pay | Admitting: Internal Medicine

## 2019-07-16 DIAGNOSIS — F411 Generalized anxiety disorder: Secondary | ICD-10-CM

## 2019-07-21 ENCOUNTER — Other Ambulatory Visit: Payer: Self-pay | Admitting: Internal Medicine

## 2019-07-21 DIAGNOSIS — E785 Hyperlipidemia, unspecified: Secondary | ICD-10-CM

## 2019-07-28 ENCOUNTER — Encounter: Payer: Self-pay | Admitting: Internal Medicine

## 2019-07-28 DIAGNOSIS — R635 Abnormal weight gain: Secondary | ICD-10-CM

## 2019-08-06 ENCOUNTER — Encounter: Payer: Self-pay | Admitting: Internal Medicine

## 2019-09-24 ENCOUNTER — Other Ambulatory Visit: Payer: Self-pay | Admitting: Internal Medicine

## 2019-09-24 DIAGNOSIS — F411 Generalized anxiety disorder: Secondary | ICD-10-CM

## 2019-10-19 ENCOUNTER — Encounter: Payer: Self-pay | Admitting: Obstetrics & Gynecology

## 2019-10-19 ENCOUNTER — Other Ambulatory Visit: Payer: Self-pay

## 2019-10-19 ENCOUNTER — Ambulatory Visit (INDEPENDENT_AMBULATORY_CARE_PROVIDER_SITE_OTHER): Payer: 59 | Admitting: Obstetrics & Gynecology

## 2019-10-19 VITALS — BP 138/84 | HR 96 | Temp 98.0°F | Resp 14 | Ht 68.0 in | Wt 303.2 lb

## 2019-10-19 DIAGNOSIS — Z23 Encounter for immunization: Secondary | ICD-10-CM | POA: Diagnosis not present

## 2019-10-19 DIAGNOSIS — Z01419 Encounter for gynecological examination (general) (routine) without abnormal findings: Secondary | ICD-10-CM | POA: Diagnosis not present

## 2019-10-19 NOTE — Progress Notes (Signed)
33 y.o. G0P0000 Single White or Caucasian female here for annual exam.  Cycles have been fairly regular until last month.  Skipped October.  Not sexually active.  Frustrated with weight.  Starting at [facility].  Appt is on 10/29/2019.    Patient's last menstrual period was 09/08/2019.          Sexually active: No.  The current method of family planning is abstinence.    Exercising: No.  The patient does not participate in regular exercise at present. Smoker:  no  Health Maintenance: Pap:   10/20/18 Neg:Neg HR HPV (adequate pap smear)  08/18/17 LGSIL History of abnormal Pap:  yes MMG:  10/20/18 BIRADS 1 negative/density a TDaP:  2016 Hep C testing: 08/18/17 Neg Screening Labs: PCP   reports that she has never smoked. She has never used smokeless tobacco. She reports current alcohol use of about 1.0 - 2.0 standard drinks of alcohol per week. She reports that she does not use drugs.  Past Medical History:  Diagnosis Date  . Anal fissure   . Anxiety   . Depression   . History of PSVT (paroxysmal supraventricular tachycardia)    managed with Beta Blockers & Vagal Maneuvers.  Marland Kitchen HTN (hypertension)   . Hyperlipidemia   . 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 Mononucleosis 02/2017    Past Surgical History:  Procedure Laterality Date  . COLPOSCOPY  10/2017  . TRANSTHORACIC ECHOCARDIOGRAM  03/2017   Normal EF 55-60%. GR 1 DD. Normal Valves - no MVP  . WISDOM TOOTH EXTRACTION      Current Outpatient Medications  Medication Sig Dispense Refill  . buPROPion (WELLBUTRIN SR) 150 MG 12 hr tablet TAKE 1 TABLET BY MOUTH EVERY MORNING X 2 WEEKS, THEN 2 TABLETS BY MOUTH DAILY 180 tablet 1  . cholecalciferol (VITAMIN D) 1000 UNITS tablet Take 1,000 Units by mouth daily.     . citalopram (CELEXA) 20 MG tablet Take one tablet by mouth daily. 90 tablet 1  . metoprolol succinate (TOPROL-XL) 50 MG 24 hr tablet Take 1 tablet (50 mg total) by mouth daily. 90 tablet 1  . simvastatin  (ZOCOR) 40 MG tablet TAKE 1 TABLET BY MOUTH EVERY DAY 90 tablet 1  . Multiple Vitamins-Minerals (HAIR/SKIN/NAILS/BIOTIN PO) Take 1 tablet by mouth 2 (two) times daily.    . Omega-3 Fatty Acids (FISH OIL) 1000 MG CAPS Take by mouth daily.     No current facility-administered medications for this visit.     Family History  Problem Relation Age of Onset  . Hyperlipidemia Mother   . Breast cancer Mother 53  . Hyperlipidemia Father   . Depression Sister   . Heart disease Maternal Grandfather   . Diabetes Maternal Grandfather   . Lung cancer Paternal Grandfather   . Heart attack Maternal Uncle     Review of Systems  All other systems reviewed and are negative.   Exam:   BP 138/84 (BP Location: Right Arm, Patient Position: Sitting, Cuff Size: Large)   Pulse 96   Temp 98 F (36.7 C) (Temporal)   Resp 14   Ht 5\' 8"  (1.727 m)   Wt (!) 303 lb 3.2 oz (137.5 kg)   LMP 09/08/2019   BMI 46.10 kg/m   Height: 5\' 8"  (172.7 cm)  Ht Readings from Last 3 Encounters:  10/19/19 5\' 8"  (1.727 m)  06/19/19 5\' 8"  (1.727 m)  01/04/19 5\' 8"  (1.727 m)    General appearance: alert, cooperative and  appears stated age Head: Normocephalic, without obvious abnormality, atraumatic Neck: no adenopathy, supple, symmetrical, trachea midline and thyroid normal to inspection and palpation Lungs: clear to auscultation bilaterally Breasts: normal appearance, no masses or tenderness Heart: regular rate and rhythm Abdomen: soft, non-tender; bowel sounds normal; no masses,  no organomegaly Extremities: extremities normal, atraumatic, no cyanosis or edema Skin: Skin color, texture, turgor normal. No rashes or lesions Lymph nodes: Cervical, supraclavicular, and axillary nodes normal. No abnormal inguinal nodes palpated Neurologic: Grossly normal   Pelvic: External genitalia:  no lesions              Urethra:  normal appearing urethra with no masses, tenderness or lesions              Bartholins and Skenes:  normal                 Vagina: normal appearing vagina with normal color and discharge, no lesions              Cervix: no lesions              Pap taken: No. Bimanual Exam:  Uterus:  normal size, contour, position, consistency, mobility, non-tender              Adnexa: normal adnexa and no mass, fullness, tenderness               Rectovaginal: Confirms               Anus:  normal sphincter tone, no lesions  Chaperone was present for exam.  A:  Well Woman with normal exam H/o LGSIL pap smear Mother with breast cancer in her late 5's (alive) with negative genetic testing Lifetime risk for breast cancer is 24.4% Elevated lipids Obesity with weight gain Not SA  P:   Mammogram done last year.  Recommended follow up in 3 years.  We will need to schedule for pt.  Called Dr. Jeralyn Ruths to review.  As there are no strict guidelines for this age group, this was Dr. Cherlyn Labella recommendation and pt is comfortable with this.  Adjuvant breat MRI recommended at age 53. pap smear not indicated.  New ASCCP guidelines reviewed.  Needs recall in 36 months from last pap smear Lab work will be done with [facility] on 10/29/2019 Influenza vaccination given today. Return annually or prn

## 2019-10-29 ENCOUNTER — Encounter: Payer: Self-pay | Admitting: Bariatrics

## 2019-10-29 ENCOUNTER — Encounter (INDEPENDENT_AMBULATORY_CARE_PROVIDER_SITE_OTHER): Payer: Self-pay | Admitting: Bariatrics

## 2019-10-29 ENCOUNTER — Ambulatory Visit (INDEPENDENT_AMBULATORY_CARE_PROVIDER_SITE_OTHER): Payer: 59 | Admitting: Bariatrics

## 2019-10-29 ENCOUNTER — Other Ambulatory Visit: Payer: Self-pay

## 2019-10-29 VITALS — BP 123/83 | HR 90 | Temp 98.8°F | Ht 68.0 in | Wt 299.0 lb

## 2019-10-29 DIAGNOSIS — F3289 Other specified depressive episodes: Secondary | ICD-10-CM

## 2019-10-29 DIAGNOSIS — E785 Hyperlipidemia, unspecified: Secondary | ICD-10-CM | POA: Diagnosis not present

## 2019-10-29 DIAGNOSIS — R5383 Other fatigue: Secondary | ICD-10-CM

## 2019-10-29 DIAGNOSIS — Z9189 Other specified personal risk factors, not elsewhere classified: Secondary | ICD-10-CM

## 2019-10-29 DIAGNOSIS — Z6841 Body Mass Index (BMI) 40.0 and over, adult: Secondary | ICD-10-CM

## 2019-10-29 DIAGNOSIS — R0602 Shortness of breath: Secondary | ICD-10-CM | POA: Diagnosis not present

## 2019-10-29 DIAGNOSIS — Z0289 Encounter for other administrative examinations: Secondary | ICD-10-CM

## 2019-10-29 DIAGNOSIS — G4733 Obstructive sleep apnea (adult) (pediatric): Secondary | ICD-10-CM

## 2019-10-29 DIAGNOSIS — E559 Vitamin D deficiency, unspecified: Secondary | ICD-10-CM

## 2019-10-30 NOTE — Progress Notes (Signed)
Office: [phone]  /  Fax: [phone]   Dear Dr. Chaya JanEstela Hernandez Cortez,   Thank you for referring Rose Cortez to our clinic. The following note includes my evaluation and treatment recommendations.  HPI:   Chief Complaint: OBESITY    Rose Cortez has been referred by Dr. Chaya JanEstela Hernandez Cortez for consultation regarding her obesity and obesity related comorbidities.    Rose Cortez (MR# [id number]) is a 33 y.o. female who presents on 10/30/2019 for obesity evaluation and treatment. Current BMI is Body mass index is 45.46 kg/m.Rose Cortez. Rose Cortez has been struggling with her weight for many years and has been unsuccessful in either losing weight, maintaining weight loss, or reaching her healthy weight goal.     Rose Cortez states she is currently in the action stage of change and ready to dedicate time achieving and maintaining a healthier weight. Rose Cortez is interested in becoming our patient and working on intensive lifestyle modifications including (but not limited to) diet, exercise and weight loss.     Rose Cortez does like to cook sometimes, but notes the prep work and too Musicianmany leftovers. She craves carbohydrates and sugars mostly in the afternoon and evening. She does skip breakfast on weekends.    Rose Cortez states her family eats meals together she struggles with family and or coworkers weight loss sabotage her desired weight loss is 139 lbs she has been heavy most of  her life she started gaining weight in childhood - preteen years her heaviest weight ever was 305 lbs. she has significant food cravings issues  she snacks frequently in the evenings she wakes up frquently in the middle of the night to eat she skips breakfast on weekends she frequently makes poor food choices she has problems with excessive hunger  she frequently eats larger portions than normal  she has binge eating behaviors she struggles with emotional eating    Fatigue Rose Cortez feels her energy is lower than it  should be. This has worsened with weight gain and has worsened recently. Rose Cortez denies daytime somnolence and denies waking up still tired. Patient generally gets 5-6 hours of sleep per night, and states they generally do not sleep well most nights. Snoring is present. Apneic episodes are not present. Epworth Sleepiness Score is 5.  Dyspnea on exertion Rose Cortez notes increasing shortness of breath with exercising and seems to be worsening over time with weight gain. She notes getting out of breath sooner with activity than she used to. This has gotten worse recently. Rose Cortez denies orthopnea.  Dyslipidemia Rose Cortez has a diagnosis of dyslipidemia and is taking Fish Oil and Zocor.  Vitamin D deficiency Rose Cortez has a diagnosis of Vitamin D deficiency. Last Vitamin D 39.8 on 11/23/2018. She is currently taking Vit D 5,000 units and denies nausea, vomiting or muscle weakness.  At risk for osteopenia and osteoporosis Rose Cortez is at higher risk of osteopenia and osteoporosis due to Vitamin D deficiency.   Depression with emotional eating behaviors Rose Cortez is struggling with emotional eating and using food for comfort to the extent that it is negatively impacting her health. She often snacks when she is not hungry. Rose Cortez sometimes feels she is out of control and then feels guilty that she made poor food choices. She has been working on behavior modification techniques to help reduce her emotional eating and has been somewhat successful. Rose Cortez reports a history of GAD and reports stress and emotional eating. She is taking Celexa and Wellbutrin. PHQ-9 score today is 23. She shows no sign  of suicidal or homicidal ideations.  Depression Screen Rose Cortez's Food and Mood (modified PHQ-9) score was 23. Depression screen PHQ 2/9 10/29/2019  Decreased Interest 3  Down, Depressed, Hopeless 3  PHQ - 2 Score 6  Altered sleeping 3  Tired, decreased energy 3  Change in appetite 3  Feeling bad or failure about yourself  2   Trouble concentrating 3  Moving slowly or fidgety/restless 2  Suicidal thoughts 1  PHQ-9 Score 23  Difficult doing work/chores Somewhat difficult   Sleep Apnea Rose Cortez has a diagnosis of sleep apnea. She has tried CPAP but found it difficult to wear.  ASSESSMENT AND PLAN:  Other fatigue - Plan: EKG 12-Lead, T3, T4, free, TSH, Insulin, random, HgB A1c, Comprehensive Metabolic Panel (CMET)  Shortness of breath on exertion  Dyslipidemia - Plan: Lipid Panel With LDL/HDL Ratio  Other depression - with emotional eating  Obstructive sleep apnea syndrome  Vitamin D deficiency - Plan: Vitamin D (25 hydroxy)  At risk for osteoporosis  Class 3 severe obesity with serious comorbidity and body mass index (BMI) of 45.0 to 49.9 in adult, unspecified obesity type (HCC)  PLAN:  Fatigue Rose Cortez was informed that her fatigue may be related to obesity, depression or many other causes. Labs will be ordered, and in the meanwhile Rose Cortez has agreed to work on diet, exercise and weight loss to help with fatigue. Proper sleep hygiene was discussed including the need for 7-8 hours of quality sleep each night. A sleep study was not ordered based on symptoms and Epworth score. Rose Cortez will gradually increase exercise.  Dyspnea on exertion Rose Cortez's shortness of breath appears to be obesity related and exercise induced. She has agreed to work on weight loss and gradually increase exercise to treat her exercise induced shortness of breath. If Rose Cortez follows our instructions and loses weight without improvement of her shortness of breath, we will plan to refer to pulmonology. We will monitor this condition regularly. Rose Cortez agrees to this plan.  Dyslipidemia Rose Cortez will continue her medications. Will have lipids checked and follow-up as directed for review of lab results.  Vitamin D Deficiency Rose Cortez was informed that low Vitamin D levels contributes to fatigue and are associated with obesity, breast, and colon cancer.  She will have routine testing of Vitamin D and follow-up with our clinic in 2-3 weeks.  At risk for osteopenia and osteoporosis Rose Cortez was given extended  (15 minutes) osteoporosis prevention counseling today. Rose Cortez is at risk for osteopenia and osteoporosis due to her Vitamin D deficiency. She was encouraged to take her Vitamin D and follow her higher calcium diet and increase strengthening exercise to help strengthen her bones and decrease her risk of osteopenia and osteoporosis.  Depression with Emotional Eating Behaviors We discussed behavior modification techniques today to help Rose Cortez deal with her emotional eating and depression. Rose Cortez will be referred to Dr.  Dewaine Conger, our bariatric psychologist, for evaluation.  Depression Screen Rose Cortez had a strongly positive depression screening. Depression is commonly associated with obesity and often results in emotional eating behaviors. We will monitor this closely and work on CBT to help improve the non-hunger eating patterns. Referral to Psychology may be required if no improvement is seen as she continues in our clinic.  Sleep Apnea Rose Cortez is currently using a snore guard and not using CPAP. She will work on weight loss.  Obesity Rose Cortez is currently in the action stage of change and her goal is to continue with weight loss efforts. I recommend Rose Cortez begin the  structured treatment plan as follows:  She has agreed to follow the Category 4 plan + 200 calories. Rose Cortez will work on meal planning and will not skip meals.  Rose Cortez has been instructed to eventually work up to a goal of 150 minutes of combined cardio and strengthening exercise per week for weight loss and overall health benefits. We discussed the following Behavioral Modification Strategies today: increasing lean protein intake, decreasing simple carbohydrates, increasing vegetables, increase H20 intake, decrease eating out, no skipping meals, work on meal planning and easy cooking plans, and  keeping healthy foods in the home.    She was informed of the importance of frequent follow-up visits to maximize her success with intensive lifestyle modifications for her multiple health conditions. She was informed we would discuss her lab results at her next visit unless there is a critical issue that needs to be addressed sooner. Rose Cortez agreed to keep her next visit at the agreed upon time to discuss these results.  ALLERGIES: No Known Allergies  MEDICATIONS: Current Outpatient Medications on File Prior to Visit  Medication Sig Dispense Refill  . buPROPion (WELLBUTRIN SR) 150 MG 12 hr tablet TAKE 1 TABLET BY MOUTH EVERY MORNING X 2 WEEKS, THEN 2 TABLETS BY MOUTH DAILY 180 tablet 1  . cholecalciferol (VITAMIN D) 1000 UNITS tablet Take 1,000 Units by mouth daily.     . citalopram (CELEXA) 20 MG tablet Take one tablet by mouth daily. 90 tablet 1  . metoprolol succinate (TOPROL-XL) 50 MG 24 hr tablet Take 1 tablet (50 mg total) by mouth daily. 90 tablet 1  . Multiple Vitamins-Minerals (HAIR/SKIN/NAILS/BIOTIN PO) Take 1 tablet by mouth 2 (two) times daily.    . Omega-3 Fatty Acids (FISH OIL) 1000 MG CAPS Take by mouth daily.    . simvastatin (ZOCOR) 40 MG tablet TAKE 1 TABLET BY MOUTH EVERY DAY 90 tablet 1  . TURMERIC PO Take by mouth.     No current facility-administered medications on file prior to visit.     PAST MEDICAL HISTORY: Past Medical History:  Diagnosis Date  . ADHD   . Anal fissure   . Anxiety   . Back pain   . Depression   . GERD (gastroesophageal reflux disease)   . High blood pressure   . History of PSVT (paroxysmal supraventricular tachycardia)    managed with Beta Blockers & Vagal Maneuvers.  Marland Kitchen HTN (hypertension)   . Hyperlipidemia   . 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 Liver problem   . Mononucleosis 02/2017  . Obesity   . Sleep apnea   . SVT (supraventricular tachycardia) (HCC)   . Vitamin D deficiency     PAST SURGICAL HISTORY: Past Surgical  History:  Procedure Laterality Date  . COLPOSCOPY  10/2017  . TRANSTHORACIC ECHOCARDIOGRAM  03/2017   Normal EF 55-60%. GR 1 DD. Normal Valves - no MVP  . WISDOM TOOTH EXTRACTION      SOCIAL HISTORY: Social History   Tobacco Use  . Smoking status: Never Smoker  . Smokeless tobacco: Never Used  Substance Use Topics  . Alcohol use: Yes    Alcohol/week: 1.0 - 2.0 standard drinks    Types: 1 - 2 Standard drinks or equivalent per week  . Drug use: No    FAMILY HISTORY: Family History  Problem Relation Age of Onset  . Hyperlipidemia Mother   . Breast cancer Mother 53       negative genetic testing  . High blood pressure Mother   .  Anxiety disorder Mother   . Thyroid disease Mother   . Hyperlipidemia Father   . High blood pressure Father   . Anxiety disorder Father   . Depression Sister   . Heart disease Maternal Grandfather   . Diabetes Maternal Grandfather   . Lung cancer Paternal Grandfather   . Heart attack Maternal Uncle    ROS: Review of Systems  Constitutional: Positive for malaise/fatigue.  Respiratory:       Positive for sleep apnea.  Cardiovascular: Positive for palpitations. Negative for orthopnea.  Gastrointestinal: Negative for nausea and vomiting.  Musculoskeletal: Positive for back pain and neck pain.       Negative for muscle weakness. Positive for neck stiffness. Positive for muscle stiffness.  Neurological: Positive for weakness and headaches.       Positive for calf/leg pain with walking.  Endo/Heme/Allergies:       Positive for polyphagia.  Psychiatric/Behavioral: Positive for depression (emotional eating). Negative for suicidal ideas. The patient is nervous/anxious and has insomnia.        Negative for homicidal ideas. Positive for stress.   PHYSICAL EXAM: Blood pressure 123/83, pulse 90, temperature 98.8 F (37.1 C), height  (1.727 m), weight 299 lb (135.6 kg), last menstrual period 09/08/2019, SpO2 98 %. Body mass index is 45.46 kg/m.  Physical Exam Vitals signs reviewed.  Constitutional:      Appearance: Normal appearance. She is well-developed. She is obese.  HENT:     Head: Normocephalic and atraumatic.     Nose: Nose normal.  Eyes:     General: No scleral icterus. Neck:     Musculoskeletal: Normal range of motion.  Cardiovascular:     Rate and Rhythm: Normal rate and regular rhythm.  Pulmonary:     Effort: Pulmonary effort is normal. No respiratory distress.  Abdominal:     Palpations: Abdomen is soft.     Tenderness: There is no abdominal tenderness.  Musculoskeletal: Normal range of motion.     Comments: Range of motion normal in all four extremities.  Skin:    General: Skin is warm and dry.  Neurological:     Mental Status: She is alert and oriented to person, place, and time.     Coordination: Coordination normal.  Psychiatric:        Mood and Affect: Mood and affect normal.        Behavior: Behavior normal.   RECENT LABS AND TESTS: BMET    Component Value Date/Time   NA WILL FOLLOW 10/29/2019 0920   K WILL FOLLOW 10/29/2019 0920   CL WILL FOLLOW 10/29/2019 0920   CO2 WILL FOLLOW 10/29/2019 0920   GLUCOSE WILL FOLLOW 10/29/2019 0920   GLUCOSE 85 05/27/2016 1347   BUN WILL FOLLOW 10/29/2019 0920   CREATININE WILL FOLLOW 10/29/2019 0920   CREATININE 0.63 05/27/2016 1347   CALCIUM WILL FOLLOW 10/29/2019 0920   GFRNONAA WILL FOLLOW 10/29/2019 0920   GFRAA WILL FOLLOW 10/29/2019 0920   Lab Results  Component Value Date   HGBA1C 5.7 (H) 10/29/2019   Lab Results  Component Value Date   INSULIN WILL FOLLOW 10/29/2019   CBC    Component Value Date/Time   WBC 7.0 05/05/2018 1036   WBC 7.3 02/02/2017 0852   WBC 5.6 05/27/2016 1347   RBC 4.76 05/05/2018 1036   RBC 4.59 02/02/2017 0852   RBC 4.77 05/27/2016 1347   HGB 13.9 05/05/2018 1036   HCT 42.0 05/05/2018 1036   PLT 404 05/05/2018 1036  MCV 88 05/05/2018 1036   MCH 29.2 05/05/2018 1036   MCH 30.0 02/02/2017 0852   MCH 29.8  05/27/2016 1347   MCHC 33.1 05/05/2018 1036   MCHC 34.8 02/02/2017 0852   MCHC 34.0 05/27/2016 1347   RDW 14.2 05/05/2018 1036   LYMPHSABS 2.2 05/05/2018 1036   EOSABS 0.2 05/05/2018 1036   BASOSABS 0.0 05/05/2018 1036   Iron/TIBC/Ferritin/ %Sat No results found for: IRON, TIBC, FERRITIN, IRONPCTSAT Lipid Panel     Component Value Date/Time   CHOL WILL FOLLOW 10/29/2019 0920   TRIG WILL FOLLOW 10/29/2019 0920   HDL WILL FOLLOW 10/29/2019 0920   CHOLHDL 3.3 11/23/2018 0000   CHOLHDL 5.5 (H) 05/27/2016 1347   VLDL 52 (H) 05/27/2016 1347   LDLCALC WILL FOLLOW 10/29/2019 0920   Hepatic Function Panel     Component Value Date/Time   PROT WILL FOLLOW 10/29/2019 0920   ALBUMIN WILL FOLLOW 10/29/2019 0920   AST WILL FOLLOW 10/29/2019 0920   ALT WILL FOLLOW 10/29/2019 0920   ALKPHOS WILL FOLLOW 10/29/2019 0920   BILITOT WILL FOLLOW 10/29/2019 0920   BILIDIR 1.63 (H) 02/03/2017 0000      Component Value Date/Time   TSH WILL FOLLOW 10/29/2019 0920   TSH 0.854 08/18/2017 1543   TSH 2.100 01/31/2017 0932   Results for KAMIRAH, SHUGRUE Rose (MRN [id number]) as of 10/30/2019 08:31  Ref. Range 11/23/2018 17:03  Vitamin D, 25-Hydroxy Latest Ref Range: 30.0 - 100.0 ng/mL 39.8   ECG shows sinus rhythm with a rate of 93 BPM. Within normal limits.  INDIRECT CALORIMETER done today shows a VO2 of 460 and a REE of 3204.  Her calculated basal metabolic rate is 4970 thus her basal metabolic rate is better than expected.  OBESITY BEHAVIORAL INTERVENTION VISIT  Today's visit was #1  Starting weight: 299 lbs Starting date: 10/29/2019 Today's weight: 299 lbs  Today's date: 10/29/2019 Total lbs lost to date: 0    10/29/2019  Height 5\' 8"  (1.727 m)  Weight 299 lb (135.6 kg)  BMI (Calculated) 45.47  BLOOD PRESSURE - SYSTOLIC 263  BLOOD PRESSURE - DIASTOLIC 83  Waist Measurement  51 inches   Body Fat % 51 %  Total Body Water (lbs) 109.4 lbs  RMR 3204   ASK: We discussed the diagnosis of  obesity with Rose Cortez today and Rose Cortez Pons agreed to give [place] permission to discuss obesity behavioral modification therapy today.  ASSESS: Severina has the diagnosis of obesity and her BMI today is 45.5. Shaina is in the action stage of change.   ADVISE: Oyinkansola was educated on the multiple health risks of obesity as well as the benefit of weight loss to improve her health. She was advised of the need for long term treatment and the importance of lifestyle modifications to improve her current health and to decrease her risk of future health problems.  AGREE: Multiple dietary modification options and treatment options were discussed and  Mollie agreed to follow the recommendations documented in the above note.  ARRANGE: Anyely was educated on the importance of frequent visits to treat obesity as outlined per CMS and USPSTF guidelines and agreed to schedule her next follow up appointment today.  Migdalia Dk, am acting as Location manager for [employer], DO   I have reviewed the above documentation for accuracy and completeness, and I agree with the above. -Jearld Lesch, DO

## 2019-10-31 ENCOUNTER — Encounter (INDEPENDENT_AMBULATORY_CARE_PROVIDER_SITE_OTHER): Payer: Self-pay | Admitting: Bariatrics

## 2019-10-31 DIAGNOSIS — R7303 Prediabetes: Secondary | ICD-10-CM | POA: Insufficient documentation

## 2019-10-31 LAB — COMPREHENSIVE METABOLIC PANEL
ALT: 28 IU/L (ref 0–32)
AST: 22 IU/L (ref 0–40)
Albumin/Globulin Ratio: 1.4 (ref 1.2–2.2)
Albumin: 4.1 g/dL (ref 3.8–4.8)
Alkaline Phosphatase: 70 IU/L (ref 39–117)
BUN/Creatinine Ratio: 21 (ref 9–23)
BUN: 13 mg/dL (ref 6–20)
Bilirubin Total: 0.3 mg/dL (ref 0.0–1.2)
CO2: 22 mmol/L (ref 20–29)
Calcium: 9.5 mg/dL (ref 8.7–10.2)
Chloride: 102 mmol/L (ref 96–106)
Creatinine, Ser: 0.61 mg/dL (ref 0.57–1.00)
GFR calc Af Amer: 139 mL/min/{1.73_m2} (ref >59)
GFR calc non Af Amer: 120 mL/min/{1.73_m2} (ref >59)
Globulin, Total: 2.9 g/dL (ref 1.5–4.5)
Glucose: 79 mg/dL (ref 65–99)
Potassium: 4.9 mmol/L (ref 3.5–5.2)
Sodium: 139 mmol/L (ref 134–144)
Total Protein: 7 g/dL (ref 6.0–8.5)

## 2019-10-31 LAB — HEMOGLOBIN A1C
Est. average glucose Bld gHb Est-mCnc: 117 mg/dL
Hgb A1c MFr Bld: 5.7 % — ABNORMAL HIGH (ref 4.8–5.6)

## 2019-10-31 LAB — TSH: TSH: 1.07 u[IU]/mL (ref 0.450–4.500)

## 2019-10-31 LAB — LIPID PANEL WITH LDL/HDL RATIO
Cholesterol, Total: 168 mg/dL (ref 100–199)
HDL: 35 mg/dL — ABNORMAL LOW (ref >39)
LDL Chol Calc (NIH): 97 mg/dL (ref 0–99)
LDL/HDL Ratio: 2.8 ratio (ref 0.0–3.2)
Triglycerides: 212 mg/dL — ABNORMAL HIGH (ref 0–149)
VLDL Cholesterol Cal: 36 mg/dL (ref 5–40)

## 2019-10-31 LAB — INSULIN, RANDOM: INSULIN: 39.3 u[IU]/mL — ABNORMAL HIGH (ref 2.6–24.9)

## 2019-10-31 LAB — VITAMIN D 25 HYDROXY (VIT D DEFICIENCY, FRACTURES): Vit D, 25-Hydroxy: 34.6 ng/mL (ref 30.0–100.0)

## 2019-10-31 LAB — T3: T3, Total: 158 ng/dL (ref 71–180)

## 2019-10-31 LAB — T4, FREE: Free T4: 1 ng/dL (ref 0.82–1.77)

## 2019-10-31 NOTE — Progress Notes (Signed)
Office: [phone]  /  Fax: [phone]    Date: November 12, 2019  Time Seen: 11:02am Duration: 61 minutes Provider: Glennie Isle, PsyD Type of Session: Intake for Individual Therapy  Type of Contact: Face-to-face  Informed Consent for In-Person Services During COVID-19: During today's appointment, information about the decision to initiate in-person services in light of the GYFVC-94 public health crisis was discussed. Rose Cortez and this provider agreed to meet in person for some or all future appointments. If there is a resurgence of the pandemic or other health concerns arise, telepsychological services may be initiated and any related concerns will be discussed and an attempt to address them will be made. Rose Cortez verbally acknowledged understanding that if necessary, this provider may determine there is a need to initiate telepsychological services for everyone's well-being. Rose Cortez expressed understanding she may request to initiate telepsychological services, and that request will be respected as long as it is feasible and clinically appropriate. Regarding telepsychological services, Rose Cortez acknowledged she is ultimately responsible for understanding her insurance benefits as it relates to reimbursement of telepsychological services. Moreover, the risks for opting for in-person services was discussed. Rose Cortez verbally acknowledged understanding that by coming to the office, she is assuming the risk of exposure to the coronavirus or other public risk, and the risk may increase if Rose Cortez travels by public transportation, cab, or [employer]. To obtain in-person services, Rose Cortez verbally agreed to taking certain precautions (e.g., screening prior to appointment; universal masking; social distancing of 6 feet; proper hand hygiene; no visitors) set forth by [facility] to keep everyone safe from exposure, sickness, and possible death. This information was shared by front desk staff either at the time of  scheduling and/or during the check-in process. Rose Cortez expressed understanding that should she not adhere to these safeguards, it may result in starting/returning to a telepsychological service arrangement and/or the exploration of other options for treatment. Rose Cortez acknowledged understanding that [facility] will follow the protocol set forth by [facility] should a patient present with a fever or other symptoms or disclose recent exposure, which will include rescheduling the appointment. Furthermore, Rose Cortez acknowledged understanding that precautions may change if additional local, state or federal orders or guidelines are published. This provider also shared that if Rose Cortez tests positive for the coronavirus, this provider may be required to notify local health authorities that Rose Cortez was in the [facility] clinic. Only minimum information necessary for data collection will be disclosed. This provider will follow [facility]'s disclosure policy should this provider or staff test positive for the coronavirus. To avoid handling of paper/writing instruments and increasing likelihood of touching, verbal consent was obtained by Rose Cortez during today's appointment prior to proceeding. Lestine provided verbal consent to proceed, and acknowledged understanding that by verbally consenting to proceed, she is agreeable to all information noted above.   Informed Consent: The provider's role was explained to Rose Cortez. The provider reviewed and discussed issues of confidentiality, privacy, and limits therein (e.g., reporting obligations). In addition to verbal informed consent, written informed consent for psychological services was obtained from [facility] prior to the initial intake interview. Written consent included information concerning the practice, financial arrangements, and confidentiality and patients' rights. Since the clinic is not a 24/7 crisis center, mental health emergency resources were  shared, and the provider explained MyChart, e-mail, voicemail, and/or other messaging systems should be utilized only for non-emergency reasons. This provider also explained that information obtained during appointments will be placed in Rose Cortez  record in a confidential manner and relevant information will be shared with other providers at [facility] that she meets with for coordination of care. Dajanee verbally acknowledged understanding of the aforementioned, and agreed to use mental health emergency resources discussed if needed. Moreover, Kaila agreed information may be shared with other [facility] providers as needed for coordination of care. By signing the service agreement document, Kaedance provided written consent for coordination of care.   Chief Complaint/HPI: Rose Cortez was referred by Rose Cortez due to depression with emotional eating behaviors. Per the note for the initial visit with Rose Cortez on October 29, 2019, "Rose Cortez is struggling with emotional eating and using food for comfort to the extent that it is negatively impacting her health. She often snacks when she is not hungry. Rose Cortez sometimes feels she is out of control and then feels guilty that she made poor food choices. She has been working on behavior modification techniques to help reduce her emotional eating and has been somewhat successful. Rose Cortez reports a history of GAD and reports stress and emotional eating. She is taking Celexa and Wellbutrin. PHQ-9 score today is 23. She shows no sign of suicidal or homicidal ideations." During the initial appointment with Rose Cortez at [facility] on October 29, 2019, Rose Cortez reported experiencing the following: significant food cravings issues , snacking frequently in the evenings, frequently making poor food choices, frequently eating larger portions than normal , binge eating behaviors, struggling with emotional eating, waking up frquently  in the middle of the night to eat, having problems with excessive hunger and skipping breakfast on the weekends. Rose Cortez's Food and Mood (modified PHQ-9) score was 23.   During today's appointment, Rose Cortez was verbally administered a questionnaire assessing various behaviors related to emotional eating. Rose Cortez endorsed the following: overeat when you are celebrating, experience food cravings on a regular basis, eat certain foods when you are anxious, stressed, depressed, or your feelings are hurt, use food to help you cope with emotional situations, find food is comforting to you, overeat when you are angry or upset, overeat when you are worried about something, overeat frequently when you are bored or lonely, not worry about what you eat when you are in a good mood, overeat when you are alone, but eat much less when you are with other people and eat as a reward. She shared she craves chips, [place] onion dip, chocolate chip cookies, chicken fingers, fries, and cheese. Kei believes the onset of emotional eating was likely in childhood and described the current frequency of emotional eating as "at least once a day." Prior to the pandemic, she indicated she was exercising regularly and noted she began starting to order take out. In addition, Rose Cortez denied a history of binge eating, but acknowledged eating larger portions. Rose Cortez denied a history of restricting food intake, purging and engagement in other compensatory strategies, and has never been diagnosed with an eating disorder. She also denied a history of treatment for emotional eating. Of note, Rose Cortez shared she was prescribed Phentermine from 2012-2014. Moreover, Valarie indicated loneliness and boredom triggers emotional eating, whereas being around others or calling a friend/loved one makes emotional eating better. Furthermore, Gala denied other problems of concern.   Mental Status Examination:  Appearance: neat Behavior: cooperative Mood: anxious Affect:  mood congruent Speech: normal in rate, volume, and tone Eye Contact: appropriate Psychomotor Activity: appropriate Thought Process: linear, logical, and goal directed  Content/Perceptual Disturbances: denies suicidal and  homicidal ideation, plan, and intent and no hallucinations, delusions, bizarre thinking or behavior reported or observed Orientation: time, person, place and purpose of appointment Cognition/Sensorium: memory, attention, language, and fund of knowledge intact  Insight: good Judgment: good  Family & Psychosocial History: Cristela reported she is not in a relationship and does not have any children. 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 Additionally, Tiaria shared her highest level of education obtained is a bachelor's degree. Currently, [place]qua's social support system consists of her close friend in [place], parents, and siblings. 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   Medical History:  Past Medical History:  Diagnosis Date   ADHD    Anal fissure    Anxiety    Back pain    Depression    GERD (gastroesophageal reflux disease)    High blood pressure    History of PSVT (paroxysmal supraventricular tachycardia)    managed with Beta Blockers & Vagal Maneuvers.   HTN (hypertension)    Hyperlipidemia    LGSIL on Pap smear of cervix 08/18/2017   Liver problem    Mononucleosis 02/2017   Obesity    Sleep apnea    SVT (supraventricular tachycardia) (HCC)    Vitamin D deficiency    Past Surgical History:  Procedure Laterality Date   COLPOSCOPY  10/2017   TRANSTHORACIC ECHOCARDIOGRAM  03/2017   Normal EF 55-60%. GR 1 DD. Normal Valves - no MVP   WISDOM TOOTH EXTRACTION     Current Outpatient Medications on File Prior to Visit  Medication Sig Dispense Refill   buPROPion (WELLBUTRIN SR) 150 MG 12 hr tablet TAKE 1 TABLET BY MOUTH EVERY MORNING X 2 WEEKS, THEN 2 TABLETS BY MOUTH Rose Cortez 180  tablet 1   cholecalciferol (VITAMIN D) 1000 UNITS tablet Take 1,000 Units by mouth Rose Cortez.      citalopram (CELEXA) 20 MG tablet Take one tablet by mouth Rose Cortez. 90 tablet 1   metoprolol succinate (TOPROL-XL) 50 MG 24 hr tablet Take 1 tablet (50 mg total) by mouth Rose Cortez. 90 tablet 1   Multiple Vitamins-Minerals (HAIR/SKIN/NAILS/BIOTIN PO) Take 1 tablet by mouth 2 (two) times Rose Cortez.     Omega-3 Fatty Acids (FISH OIL) 1000 MG CAPS Take by mouth Rose Cortez.     simvastatin (ZOCOR) 40 MG tablet TAKE 1 TABLET BY MOUTH EVERY DAY 90 tablet 1   TURMERIC PO Take by mouth.     No current facility-administered medications on file prior to visit.   Leveda denied a history of head injuries and loss of consciousness.    Mental Health History: [place] reported she first attended therapeutic services in high school and again in college. Rose Cortez met with a psychiatrist and psychologist in [place], [place] in 2012. She re-initiated therapeutic services in August of 2019 at [facility] due to "feeling down and out of it a lot." She discontinued services in December of 2019. She described experiencing depressed mood starting in her adolescence as well as "anxious thoughts," which was what prompted her to seek services starting in high school. 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 Chastidy does not currently have a therapist or psychiatrist. She shared her PCP prescribes Wellbutrin and Celexa and described the medications as helpful. Katrisha wondered if the Celexa dosage should be increased; therefore, it was recommended she call her PCP. She agreed. Inetha shared she was previously  prescribed Ritalin and Adderall by her PCP prior to her AD/HD diagnosis this year. Kamila denied a history of hospitalizations for psychiatric concerns. Ailie endorsed a family history of mental health related  concerns. She shared depression and anxiety "runs" in the family. Ema denied a trauma history, including psychological, physical  and sexual abuse, as well as neglect.   Tiyona described her typical mood as "enough energy to get up and out of bed." She further shared feeling as though she is "drifting through the day." Azarie further reported often thinking about the next chapter of life. Aside from concerns noted above and endorsed on the PHQ-9 and GAD-7, Venisa reported experiencing what if thoughts; social withdrawal; decreased motivation related to healthy habits; feeling stuck; and worry thoughts about her health, pandemic, and her parents' well-being. Brenlee endorsed current alcohol use. She noted she consumes alcohol "less than once a week." Xanthe stated she will consume one standard drink when she consumes alcohol. She denied tobacco use. She denied illicit/recreational substance use. Regarding caffeine intake, Janaysia reported consuming one cup of coffee Rose Cortez. Furthermore, Rose Cortez denied experiencing the following: hopelessness, obsessions and compulsions, hallucinations and delusions, paranoia, symptoms of mania (e.g., expansive mood, flighty ideas, decreased need for sleep, engagement in risky behaviors), angry outbursts, crying spells and panic attacks. She also denied history of and current suicidal ideation, plan, and intent; history of and current homicidal ideation, plan, and intent; and history of and current engagement in self-harm. Notably, Jacoby endorsed item 9 (i.e., "Do you feel that your weight problem is so hopeless that sometimes life doesn't seem worth living?") on the modified PHQ-9 during her initial appointment with Rose Cortez on October 29, 2019. Janese clarified she endorsed the item due to feeling hopeless about her weight, and not due to suicidal ideation.   The following strengths were reported by Rose Cortez: persistence when the interest is there and caring. The following strengths  were observed by this provider: ability to express thoughts and feelings during the therapeutic session, ability to establish and benefit from a therapeutic relationship, ability to learn and practice coping skills, willingness to work toward established goal(s) with the clinic and ability to engage in reciprocal conversation.  Legal History: Emoree denied a history of legal involvement.   Structured Assessment Results: The Patient Health Questionnaire-9 (PHQ-9) is a self-report measure that assesses symptoms and severity of depression over the course of the last two weeks. Meliya obtained a score of 14 suggesting moderate depression. Marvette finds the endorsed symptoms to be not difficult at all. Little interest or pleasure in doing things 3  Feeling down, depressed, or hopeless 3  Trouble falling or staying asleep, or sleeping too much 2  Feeling tired or having little energy 3  Poor appetite or overeating 0  Feeling bad about yourself --- or that you are a failure or have let yourself or your family down 1  Trouble concentrating on things, such as reading the newspaper or watching television 2  Moving or speaking so slowly that other people could have noticed? Or the opposite --- being so fidgety or restless that you have been moving around a lot more than usual 0  Thoughts that you would be better off dead or hurting yourself in some way 0  PHQ-9 Score 14    The Generalized Anxiety Disorder-7 (GAD-7) is a brief self-report measure that assesses symptoms of anxiety over the course of the last two weeks. Jomaira obtained a score of 3 suggesting minimal anxiety. Rose Cortez  finds the endorsed symptoms to be not difficult at all. Feeling nervous, anxious, on edge 0  Not being able to stop or control worrying 0  Worrying too much about different things 0  Trouble relaxing 0  Being so restless that it's hard to sit still 0  Becoming easily annoyed or irritable 0  Feeling afraid as if something awful might  happen 3  GAD-7 Score 3   Interventions: A chart review was conducted prior to the clinical intake interview. The PHQ-9, and GAD-7 were verbally administered and a clinical intake interview was completed. In addition, Zykira was verbally administered a Mood and Food questionnaire to assess various behaviors related to emotional eating. Throughout session, empathic reflections and validation was provided. Continuing treatment with this provider was discussed and a treatment goal was established. Psychoeducation regarding emotional versus physical hunger was provided. Alya was given a handout to utilize between now and the next appointment to increase awareness of hunger patterns and subsequent eating. Lurie provided verbal consent during today's appointment for this provider to send the aforementioned handout via e-mail along with the handout for emergency resources. In addition, this provider recommended longer-term therapeutic services based on Elese's self-report and current symptomatology, and discussed options to establish care with a new provider, including Lorenna contacting her [facility] for a Psychiatrist, exploring [email or web address], or this provider placing a referral. She noted her PCP provided an option for a provider that specializes in AD/HD and noted a plan to schedule an appointment before the next appointment with this provider.  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 The next appointment will be scheduled in two weeks. The following treatment goal was established: decrease emotional eating. For the aforementioned goal, Daylene can  benefit from individual therapy sessions that are brief in duration for approximately four to six sessions. The treatment modality will be individual therapeutic services, including an eclectic therapeutic approach utilizing techniques from Cognitive Behavioral Therapy, Patient Centered Therapy, Dialectical Behavior Therapy, Acceptance and Commitment Therapy, Interpersonal Therapy, and Cognitive Restructuring. Therapeutic approach will include various interventions as appropriate, such as validation, support, mindfulness, thought defusion, reframing, psychoeducation, values assessment, and role playing. This provider will regularly review the treatment plan and medical chart to keep informed of status changes. Neveah expressed understanding and agreement with the initial treatment plan of care.

## 2019-11-10 ENCOUNTER — Other Ambulatory Visit: Payer: Self-pay | Admitting: Internal Medicine

## 2019-11-10 DIAGNOSIS — F411 Generalized anxiety disorder: Secondary | ICD-10-CM

## 2019-11-12 ENCOUNTER — Ambulatory Visit (INDEPENDENT_AMBULATORY_CARE_PROVIDER_SITE_OTHER): Payer: 59 | Admitting: Bariatrics

## 2019-11-12 ENCOUNTER — Ambulatory Visit (INDEPENDENT_AMBULATORY_CARE_PROVIDER_SITE_OTHER): Payer: 59 | Admitting: Psychology

## 2019-11-12 ENCOUNTER — Other Ambulatory Visit: Payer: Self-pay

## 2019-11-12 ENCOUNTER — Encounter (INDEPENDENT_AMBULATORY_CARE_PROVIDER_SITE_OTHER): Payer: Self-pay | Admitting: Bariatrics

## 2019-11-12 VITALS — BP 124/84 | HR 86 | Temp 98.3°F | Ht 68.0 in | Wt 295.0 lb

## 2019-11-12 DIAGNOSIS — F909 Attention-deficit hyperactivity disorder, unspecified type: Secondary | ICD-10-CM

## 2019-11-12 DIAGNOSIS — E559 Vitamin D deficiency, unspecified: Secondary | ICD-10-CM

## 2019-11-12 DIAGNOSIS — E785 Hyperlipidemia, unspecified: Secondary | ICD-10-CM

## 2019-11-12 DIAGNOSIS — R7303 Prediabetes: Secondary | ICD-10-CM

## 2019-11-12 DIAGNOSIS — Z9189 Other specified personal risk factors, not elsewhere classified: Secondary | ICD-10-CM | POA: Diagnosis not present

## 2019-11-12 DIAGNOSIS — F330 Major depressive disorder, recurrent, mild: Secondary | ICD-10-CM | POA: Diagnosis not present

## 2019-11-12 DIAGNOSIS — Z6841 Body Mass Index (BMI) 40.0 and over, adult: Secondary | ICD-10-CM

## 2019-11-12 MED ORDER — VITAMIN D (ERGOCALCIFEROL) 1.25 MG (50000 UNIT) PO CAPS: 50000.0000 [IU] | ORAL_CAPSULE | ORAL | 0 refills | Status: DC

## 2019-11-12 NOTE — Progress Notes (Signed)
Office: [phone]  /  Fax: [phone]   HPI:   Chief Complaint: OBESITY Rose Cortez is here to discuss her progress with her obesity treatment plan. She is on the Category 4 plan + 200 calories and is following her eating plan approximately 85% of the time. She states she is exercising 0 minutes 0 times per week. Rose Cortez is down 4 lbs and likes the plan overall. She struggles with portions at times. She reports getting in adequate protein.  Her weight is 295 lb (133.8 kg) today and has had a weight loss of 4 pounds over a period of 2 weeks since her last visit. She has lost 4 lbs since starting treatment with [place].  Dyslipidemia Rose Cortez has a diagnosis of dyslipidemia and is taking Fish Oil and Zocor. Last HDL was 35 on 10/29/2019 with triglycerides of 212.  Vitamin D deficiency Rose Cortez has a diagnosis of Vitamin D deficiency. Last Vitamin D 34.6 on 10/29/2019. She is not currently taking Vit D and denies nausea, vomiting or muscle weakness.  At risk for osteopenia and osteoporosis Rose Cortez is at higher risk of osteopenia and osteoporosis due to Vitamin D deficiency.   Pre-Diabetes Rose Cortez has a diagnosis of prediabetes based on her elevated Hgb A1c and was informed this puts her at greater risk of developing diabetes. Last A1c 5.7 on 10/29/2019 with an insulin of 39.3. She has a family history of her maternal grandfather having diabetes. She denies nausea or hypoglycemia.  ASSESSMENT AND PLAN:  Dyslipidemia  Vitamin D deficiency - Plan: Vitamin D, Ergocalciferol, (DRISDOL) 1.25 MG (50000 UT) CAPS capsule  Prediabetes  At risk for osteoporosis  Class 3 severe obesity with serious comorbidity and body mass index (BMI) of 40.0 to 44.9 in adult, unspecified obesity type (HCC)  PLAN:  Dyslipidemia Rose Cortez will decrease carbohydrates, increase healthy fats, and increase exercise.  Vitamin D Deficiency Rose Cortez was informed that low Vitamin D levels contributes to fatigue and are associated with  obesity, breast, and colon cancer. She agrees to continue to take prescription Vit D @ 50,000 IU every week #4 with 0 refills and will follow-up for routine testing of Vitamin D, at least 2-3 times per year. She was informed of the risk of over-replacement of Vitamin D and agrees to not increase her dose unless she discusses this with [place] first. Rose Cortez agrees to follow-up with our clinic in 2 weeks.  At risk for osteopenia and osteoporosis Rose Cortez was given extended  (15 minutes) osteoporosis prevention counseling today. Rose Cortez is at risk for osteopenia and osteoporosis due to her Vitamin D deficiency. She was encouraged to take her Vitamin D and follow her higher calcium diet and increase strengthening exercise to help strengthen her bones and decrease her risk of osteopenia and osteoporosis.  Pre-Diabetes Rose Cortez will continue to work on weight loss, exercise, and decreasing simple carbohydrates in her diet to help decrease the risk of diabetes. We dicussed metformin including benefits and risks. She was informed that eating too many simple carbohydrates or too many calories at one sitting increases the likelihood of GI side effects. Rose Cortez will decrease carbohydrates, increase protein and healthy fats, and increase activity.  Obesity Rose Cortez is currently in the action stage of change. As such, her goal is to continue with weight loss efforts. She has agreed to follow the Category 4 plan + 200 calories with additional lunch options. Rose Cortez will work on meal planning and increasing vegetables and fruit. Rose Cortez has been instructed to use free weights for weight loss  and overall health benefits. We discussed the following Behavioral Modification Strategies today: increasing lean protein intake, decreasing simple carbohydrates, increasing vegetables, increase H20 intake, decrease liquid calories, decrease eating out, no skipping meals, work on meal planning and easy cooking plans, and keeping healthy foods in the  home.  Rose Cortez has agreed to follow-up with our clinic in 2-3 weeks. She was informed of the importance of frequent follow-up visits to maximize her success with intensive lifestyle modifications for her multiple health conditions.  ALLERGIES: No Known Allergies  MEDICATIONS: Current Outpatient Medications on File Prior to Visit  Medication Sig Dispense Refill  . buPROPion (WELLBUTRIN SR) 150 MG 12 hr tablet TAKE 1 TABLET BY MOUTH EVERY MORNING X 2 WEEKS, THEN 2 TABLETS BY MOUTH DAILY 180 tablet 1  . cholecalciferol (VITAMIN D) 1000 UNITS tablet Take 1,000 Units by mouth daily.     . citalopram (CELEXA) 20 MG tablet Take one tablet by mouth daily. 90 tablet 1  . metoprolol succinate (TOPROL-XL) 50 MG 24 hr tablet Take 1 tablet (50 mg total) by mouth daily. 90 tablet 1  . Multiple Vitamins-Minerals (HAIR/SKIN/NAILS/BIOTIN PO) Take 1 tablet by mouth 2 (two) times daily.    . Omega-3 Fatty Acids (FISH OIL) 1000 MG CAPS Take by mouth daily.    . simvastatin (ZOCOR) 40 MG tablet TAKE 1 TABLET BY MOUTH EVERY DAY 90 tablet 1  . TURMERIC PO Take by mouth.     No current facility-administered medications on file prior to visit.     PAST MEDICAL HISTORY: Past Medical History:  Diagnosis Date  . ADHD   . Anal fissure   . Anxiety   . Back pain   . Depression   . GERD (gastroesophageal reflux disease)   . High blood pressure   . History of PSVT (paroxysmal supraventricular tachycardia)    managed with Beta Blockers & Vagal Maneuvers.  Marland Kitchen HTN (hypertension)   . Hyperlipidemia   . 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 Liver problem   . Mononucleosis 02/2017  . Obesity   . Sleep apnea   . SVT (supraventricular tachycardia) (HCC)   . Vitamin D deficiency     PAST SURGICAL HISTORY: Past Surgical History:  Procedure Laterality Date  . COLPOSCOPY  10/2017  . TRANSTHORACIC ECHOCARDIOGRAM  03/2017   Normal EF 55-60%. GR 1 DD. Normal Valves - no MVP  . WISDOM TOOTH EXTRACTION       SOCIAL HISTORY: Social History   Tobacco Use  . Smoking status: Never Smoker  . Smokeless tobacco: Never Used  Substance Use Topics  . Alcohol use: Yes    Alcohol/week: 1.0 - 2.0 standard drinks    Types: 1 - 2 Standard drinks or equivalent per week  . Drug use: No    FAMILY HISTORY: Family History  Problem Relation Age of Onset  . Hyperlipidemia Mother   . Breast cancer Mother 53       negative genetic testing  . High blood pressure Mother   . Anxiety disorder Mother   . Thyroid disease Mother   . Hyperlipidemia Father   . High blood pressure Father   . Anxiety disorder Father   . Depression Sister   . Heart disease Maternal Grandfather   . Diabetes Maternal Grandfather   . Lung cancer Paternal Grandfather   . Heart attack Maternal Uncle    ROS: Review of Systems  Gastrointestinal: Negative for nausea and vomiting.  Musculoskeletal:  Negative for muscle weakness.  Endo/Heme/Allergies:       Negative for hypoglycemia.   PHYSICAL EXAM: Blood pressure 124/84, pulse 86, temperature 98.3 F (36.8 C), height 5\' 8"  (1.727 m), weight 295 lb (133.8 kg), last menstrual period 09/08/2019, SpO2 97 %. Body mass index is 44.85 kg/m. Physical Exam Vitals signs reviewed.  Constitutional:      Appearance: Normal appearance. She is obese.  Cardiovascular:     Rate and Rhythm: Normal rate.     Pulses: Normal pulses.  Pulmonary:     Effort: Pulmonary effort is normal.     Breath sounds: Normal breath sounds.  Musculoskeletal: Normal range of motion.  Skin:    General: Skin is warm and dry.  Neurological:     Mental Status: She is alert and oriented to person, place, and time.  Psychiatric:        Behavior: Behavior normal.   RECENT LABS AND TESTS: BMET    Component Value Date/Time   NA 139 10/29/2019 0920   K 4.9 10/29/2019 0920   CL 102 10/29/2019 0920   CO2 22 10/29/2019 0920   GLUCOSE 79 10/29/2019 0920   GLUCOSE 85 05/27/2016 1347   BUN 13 10/29/2019  0920   CREATININE 0.61 10/29/2019 0920   CREATININE 0.63 05/27/2016 1347   CALCIUM 9.5 10/29/2019 0920   GFRNONAA 120 10/29/2019 0920   GFRAA 139 10/29/2019 0920   Lab Results  Component Value Date   HGBA1C 5.7 (H) 10/29/2019   HGBA1C 5.5 11/23/2018   HGBA1C CANCELED 03/24/2017   HGBA1C 5.3 03/24/2017   Lab Results  Component Value Date   INSULIN 39.3 (H) 10/29/2019   CBC    Component Value Date/Time   WBC 7.0 05/05/2018 1036   WBC 7.3 02/02/2017 0852   WBC 5.6 05/27/2016 1347   RBC 4.76 05/05/2018 1036   RBC 4.59 02/02/2017 0852   RBC 4.77 05/27/2016 1347   HGB 13.9 05/05/2018 1036   HCT 42.0 05/05/2018 1036   PLT 404 05/05/2018 1036   MCV 88 05/05/2018 1036   MCH 29.2 05/05/2018 1036   MCH 30.0 02/02/2017 0852   MCH 29.8 05/27/2016 1347   MCHC 33.1 05/05/2018 1036   MCHC 34.8 02/02/2017 0852   MCHC 34.0 05/27/2016 1347   RDW 14.2 05/05/2018 1036   LYMPHSABS 2.2 05/05/2018 1036   EOSABS 0.2 05/05/2018 1036   BASOSABS 0.0 05/05/2018 1036   Iron/TIBC/Ferritin/ %Sat No results found for: IRON, TIBC, FERRITIN, IRONPCTSAT Lipid Panel     Component Value Date/Time   CHOL 168 10/29/2019 0920   TRIG 212 (H) 10/29/2019 0920   HDL 35 (L) 10/29/2019 0920   CHOLHDL 3.3 11/23/2018 0000   CHOLHDL 5.5 (H) 05/27/2016 1347   VLDL 52 (H) 05/27/2016 1347   LDLCALC 97 10/29/2019 0920   Hepatic Function Panel     Component Value Date/Time   PROT 7.0 10/29/2019 0920   ALBUMIN 4.1 10/29/2019 0920   AST 22 10/29/2019 0920   ALT 28 10/29/2019 0920   ALKPHOS 70 10/29/2019 0920   BILITOT 0.3 10/29/2019 0920   BILIDIR 1.63 (H) 02/03/2017 0000      Component Value Date/Time   TSH 1.070 10/29/2019 0920   TSH 0.854 08/18/2017 1543   TSH 2.100 01/31/2017 0932   Results for ELYCE, ZOLLINGER Rose (MRN [id number]) as of 11/12/2019 14:50  Ref. Range 10/29/2019 09:20  Vitamin D, 25-Hydroxy Latest Ref Range: 30.0 - 100.0 ng/mL 34.6   OBESITY BEHAVIORAL INTERVENTION VISIT  Today's  visit was #2  Starting weight: 299 lbs Starting date: 10/29/2019 Today's weight: 295 lbs  Today's date: 11/12/2019 Total lbs lost to date: 4     11/12/2019  Height 5\' 8"  (1.727 m)  Weight 295 lb (133.8 kg)  BMI (Calculated) 44.86  BLOOD PRESSURE - SYSTOLIC 124  BLOOD PRESSURE - DIASTOLIC 84  Total Body Water (lbs) 50.4 lbs  RMR 110.2   ASK: We discussed the diagnosis of obesity with Rose Cortez today and Rose Cortez agreed to give us permission to discuss obesity behavioral modification therapy today.  ASSESS: Rose Cortez has the diagnosis of obesity and her BMI today is 44.9. Rose Cortez is in the action stage of change.   ADVISE: Rose Cortez was educated on the multiple health risks of obesity as well as the benefit of weight loss to improve her health. She was advised of the need for long term treatment and the importance of lifestyle modifications to improve her current health and to decrease her risk of future health problems.  AGREE: Multiple dietary modification options and treatment options were discussed and  Rose Cortez agreed to follow the recommendations documented in the above note.  ARRANGE: Rose Cortez was educated on the importance of frequent visits to treat obesity as outlined per CMS and USPSTF guidelines and agreed to schedule her next follow up appointment today.  Fernanda DrumI, Denise Haag, am acting as Energy managertranscriptionist for [employer] Energy[employer]el Brown, DO  I have reviewed the above documentation for accuracy and completeness, and I agree with the above. -Corinna CapraAngel Brown, DO

## 2019-11-12 NOTE — Progress Notes (Signed)
  Office: [phone]  /  Fax: [phone]    Date: November 26, 2019   Time Seen: 10:59am Duration: 34 minutes Provider: Glennie Isle, Psy.D. Type of Session: Individual Therapy  Type of Contact: Face-to-face  Session Content: Rose Cortez is a 33 y.o. female presenting for a follow-up appointment to address the previously established treatment goal of decreasing emotional eating. The session was initiated a brief check-in. Rose Cortez shared about recent events. Regarding eating, Rose Cortez acknowledged, "I haven't been perfect with the meals with sticking with everything;" however, she noted an improvement in her breakfast eating habits. Positive reinforcement was provided. This provider further discussed options to establish care with a new provider. Rose Cortez provided verbal consent for this provider to place a referral to address coping with AD/HD symptoms and emotional eating. Moreover, psychoeducation regarding triggers for emotional eating was provided. Rose Cortez was provided a handout, and encouraged to utilize the handout between now and the next appointment to increase awareness of triggers and frequency. Rose Cortez agreed. This provider also discussed behavioral strategies for specific triggers, such as placing the utensil down when conversing to avoid mindless eating. Of note, this provider briefly discussed the Cures Act, including release of documentation and current diagnoses with Rose Cortez. She acknowledged understanding, including the rationale for current diagnoses. Overall, Rose Cortez was receptive to today's appointment as evidenced by openness to sharing, responsiveness to feedback, and willingness to explore triggers for emotional eating.  Mental Status Examination:  Appearance: well groomed and appropriate hygiene  Behavior: appropriate to circumstances Mood: euthymic Affect: mood congruent Speech: normal in rate, volume, and tone Eye Contact: appropriate Psychomotor Activity: appropriate Gait: normal  Thought Process: linear, logical, and goal directed  Thought Content/Perception: no hallucinations, delusions, bizarre thinking or behavior reported or observed and no evidence of suicidal and homicidal ideation, plan, and intent Orientation: time, person, place and purpose of appointment Memory/Concentration: memory, attention, language, and fund of knowledge intact  Insight/Judgment: good  Interventions:  Conducted a brief chart review Provided empathic reflections and validation Reviewed content from the previous session Provided positive reinforcement Employed supportive psychotherapy interventions to facilitate reduced distress and to improve coping skills with identified stressors Employed motivational interviewing skills to assess patient's willingness/desire to adhere to recommended medical treatments and assignments Psychoeducation provided regarding triggers for emotional eating  DSM-5 Diagnoses: 296.31 (F33.0) Major Depressive Disorder, Recurrent Episode, Mild, With Anxious Distress and 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 [name] has demonstrated progress in her goal as evidenced by increased awareness of hunger patterns.   Plan: Due the upcoming holidays, this provider being out of the office in the coming weeks, and [name]'s work schedule, the next appointment will be scheduled in approximately 3-4 weeks. The next session will focus on introduction of mindfulness. Additionally, this provider will place a referral for longer-term therapeutic services.

## 2019-11-12 NOTE — Telephone Encounter (Signed)
Forwarding to PCP for approval  

## 2019-11-26 ENCOUNTER — Encounter (INDEPENDENT_AMBULATORY_CARE_PROVIDER_SITE_OTHER): Payer: Self-pay | Admitting: Bariatrics

## 2019-11-26 ENCOUNTER — Ambulatory Visit (INDEPENDENT_AMBULATORY_CARE_PROVIDER_SITE_OTHER): Payer: 59 | Admitting: Bariatrics

## 2019-11-26 ENCOUNTER — Ambulatory Visit (INDEPENDENT_AMBULATORY_CARE_PROVIDER_SITE_OTHER): Payer: 59 | Admitting: Psychology

## 2019-11-26 ENCOUNTER — Other Ambulatory Visit: Payer: Self-pay

## 2019-11-26 VITALS — BP 117/77 | HR 85 | Temp 98.4°F | Ht 68.0 in | Wt 291.0 lb

## 2019-11-26 DIAGNOSIS — F3289 Other specified depressive episodes: Secondary | ICD-10-CM

## 2019-11-26 DIAGNOSIS — Z6841 Body Mass Index (BMI) 40.0 and over, adult: Secondary | ICD-10-CM

## 2019-11-26 DIAGNOSIS — E559 Vitamin D deficiency, unspecified: Secondary | ICD-10-CM | POA: Diagnosis not present

## 2019-11-26 DIAGNOSIS — Z9189 Other specified personal risk factors, not elsewhere classified: Secondary | ICD-10-CM | POA: Diagnosis not present

## 2019-11-26 DIAGNOSIS — F909 Attention-deficit hyperactivity disorder, unspecified type: Secondary | ICD-10-CM | POA: Diagnosis not present

## 2019-11-26 DIAGNOSIS — E785 Hyperlipidemia, unspecified: Secondary | ICD-10-CM | POA: Diagnosis not present

## 2019-11-26 DIAGNOSIS — F330 Major depressive disorder, recurrent, mild: Secondary | ICD-10-CM

## 2019-11-26 MED ORDER — VITAMIN D (ERGOCALCIFEROL) 1.25 MG (50000 UNIT) PO CAPS: 50000.0000 [IU] | ORAL_CAPSULE | ORAL | 0 refills | Status: DC

## 2019-11-26 NOTE — Progress Notes (Signed)
Office: [phone]  /  Fax: [phone]   HPI:  Chief Complaint: OBESITY Rose Cortez is here to discuss her progress with her obesity treatment plan. She is on the Category 4 plan + 200 calories with additional lunch options and states she is following her eating plan approximately 85% of the time. She states she is exercising with free weights 15 minutes 1 time per week.  Rose Cortez is down 4 lbs. Her breakfast and lunch have been good, but she is struggling with dinner.  Today's visit was #3 Starting weight: 299 lbs Starting date: 10/29/2019 Today's weight: 291 lbs  Today's date: 11/26/2019 Total lbs lost to date: 8 Total lbs lost since last in-office visit: 4  Dyslipidemia Rose Cortez has a diagnosis of dyslipidemia and is taking Zocor.  Vitamin D deficiency Rose Cortez has a diagnosis of Vitamin D deficiency and is taking prescription Vitamin D. Last Vitamin D was 34.6 on 10/29/2019. She denies nausea, vomiting, or muscle weakness.  At risk for osteopenia and osteoporosis Rose Cortez is at higher risk of osteopenia and osteoporosis due to Vitamin D deficiency.   Other Depression (Emotional Eating Behaviors) Rose Cortez reports emotional eating behaviors. She is seeing Dr. Mallie Mussel.  ASSESSMENT AND PLAN:  Dyslipidemia  Vitamin D deficiency - Plan: Vitamin D, Ergocalciferol, (DRISDOL) 1.25 MG (50000 UT) CAPS capsule  Other depression  At risk for osteoporosis  Class 3 severe obesity with serious comorbidity and body mass index (BMI) of 40.0 to 44.9 in adult, unspecified obesity type (Rose Cortez)  PLAN:  Dyslipidemia Rose Cortez will decrease trans and saturated fats. She will increase MUFA's and PUFA's.  Vitamin D Deficiency Rose Cortez was informed that low Vitamin D levels contributes to fatigue and are associated with obesity, breast, and colon cancer. She agrees to continue to take prescription Vit D @ 50,000 IU every week #4 with 0 refills and will follow-up for routine testing of Vitamin D, at least 2-3 times  per year. She was informed of the risk of over-replacement of Vitamin D and agrees to not increase her dose unless she discusses this with [place] first. Rose Cortez agrees to follow-up with our clinic in 3 weeks.  At risk for osteopenia and osteoporosis Rose Cortez was given extended  (15 minutes) osteoporosis prevention counseling today. Rose Cortez is at risk for osteopenia and osteoporosis due to her Vitamin D deficiency. She was encouraged to take her Vitamin D and follow her higher calcium diet and increase strengthening exercise to help strengthen her bones and decrease her risk of osteopenia and osteoporosis.  Emotional Eating Behaviors (other depression) [name] will continue to work with Dr. Mallie Mussel. Will continue to follow and monitor her progress.  Obesity Rose Cortez is currently in the action stage of change. As such, her goal is to continue with weight loss efforts. She has agreed to follow the Category 4 plan + 200 calories and journal 550-700 calories and 45+ grams of protein at supper. Rose Cortez will work on [employer]. She was given handout on Calories and Protein (breakfast, lunch, and dinner). Rose Cortez has been instructed to work up to a goal of 150 minutes of combined cardio and strengthening exercise per week for weight loss and overall health benefits. We discussed the following Behavioral Modification Strategies today: increasing lean protein intake, decreasing simple carbohydrates, increasing vegetables, increase H20 intake, decrease eating out, no skipping meals, work on meal planning and easy cooking plans, keeping healthy foods in the home, and planning for success.  Rose Cortez has agreed to follow-up with our clinic in 3 weeks. She was  informed of the importance of frequent follow-up visits to maximize her success with intensive lifestyle modifications for her multiple health conditions.  ALLERGIES: No Known Allergies  MEDICATIONS: Current Outpatient Medications on File Prior to Visit  Medication Sig  Dispense Refill  . buPROPion (WELLBUTRIN SR) 150 MG 12 hr tablet TAKE 1 TABLET BY MOUTH EVERY MORNING X 2 WEEKS, THEN 2 TABLETS BY MOUTH DAILY 180 tablet 1  . cholecalciferol (VITAMIN D) 1000 UNITS tablet Take 1,000 Units by mouth daily.     . citalopram (CELEXA) 20 MG tablet TAKE 1 TABLET BY MOUTH EVERY DAY 90 tablet 1  . metoprolol succinate (TOPROL-XL) 50 MG 24 hr tablet Take 1 tablet (50 mg total) by mouth daily. 90 tablet 1  . Multiple Vitamins-Minerals (HAIR/SKIN/NAILS/BIOTIN PO) Take 1 tablet by mouth 2 (two) times daily.    . Omega-3 Fatty Acids (FISH OIL) 1000 MG CAPS Take by mouth daily.    . simvastatin (ZOCOR) 40 MG tablet TAKE 1 TABLET BY MOUTH EVERY DAY 90 tablet 1  . TURMERIC PO Take by mouth.    . Vitamin D, Ergocalciferol, (DRISDOL) 1.25 MG (50000 UT) CAPS capsule Take 1 capsule (50,000 Units total) by mouth every 7 (seven) days. 4 capsule 0   No current facility-administered medications on file prior to visit.    PAST MEDICAL HISTORY: Past Medical History:  Diagnosis Date  . ADHD   . Anal fissure   . Anxiety   . Back pain   . Depression   . GERD (gastroesophageal reflux disease)   . High blood pressure   . History of PSVT (paroxysmal supraventricular tachycardia)    managed with Beta Blockers & Vagal Maneuvers.  Marland Kitchen. HTN (hypertension)   . Hyperlipidemia   . 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 Liver problem   . Mononucleosis 02/2017  . Obesity   . Sleep apnea   . SVT (supraventricular tachycardia) (HCC)   . Vitamin D deficiency     PAST SURGICAL HISTORY: Past Surgical History:  Procedure Laterality Date  . COLPOSCOPY  10/2017  . TRANSTHORACIC ECHOCARDIOGRAM  03/2017   Normal EF 55-60%. GR 1 DD. Normal Valves - no MVP  . WISDOM TOOTH EXTRACTION      SOCIAL HISTORY: Social History   Tobacco Use  . Smoking status: Never Smoker  . Smokeless tobacco: Never Used  Substance Use Topics  . Alcohol use: Yes    Alcohol/week: 1.0 - 2.0 standard drinks     Types: 1 - 2 Standard drinks or equivalent per week  . Drug use: No    FAMILY HISTORY: Family History  Problem Relation Age of Onset  . Hyperlipidemia Mother   . Breast cancer Mother [id number]       negative genetic testing  . High blood pressure Mother   . Anxiety disorder Mother   . Thyroid disease Mother   . Hyperlipidemia Father   . High blood pressure Father   . Anxiety disorder Father   . Depression Sister   . Heart disease Maternal Grandfather   . Diabetes Maternal Grandfather   . Lung cancer Paternal Grandfather   . Heart attack Maternal Uncle    ROS: Review of Systems  Gastrointestinal: Negative for nausea and vomiting.  Musculoskeletal:       Negative for muscle weakness.  Psychiatric/Behavioral: Positive for depression (emotional eating).   PHYSICAL EXAM: Blood pressure 117/77, pulse 85, temperature 98.4 F (36.9 C), height 5\' 8"  (1.727 m), weight 291 lb (132 kg),  last menstrual period 09/08/2019, SpO2 98 %. Body mass index is 44.25 kg/m. Physical Exam Vitals reviewed.  Constitutional:      Appearance: Normal appearance. She is obese.  Cardiovascular:     Rate and Rhythm: Normal rate.     Pulses: Normal pulses.  Pulmonary:     Effort: Pulmonary effort is normal.     Breath sounds: Normal breath sounds.  Musculoskeletal:        General: Normal range of motion.  Skin:    General: Skin is warm and dry.  Neurological:     Mental Status: She is alert and oriented to person, place, and time.  Psychiatric:        Behavior: Behavior normal.   RECENT LABS AND TESTS: BMET    Component Value Date/Time   NA 139 10/29/2019 0920   K 4.9 10/29/2019 0920   CL 102 10/29/2019 0920   CO2 22 10/29/2019 0920   GLUCOSE 79 10/29/2019 0920   GLUCOSE 85 05/27/2016 1347   BUN 13 10/29/2019 0920   CREATININE 0.61 10/29/2019 0920   CREATININE 0.63 05/27/2016 1347   CALCIUM 9.5 10/29/2019 0920   GFRNONAA 120 10/29/2019 0920   GFRAA 139 10/29/2019 0920   Lab Results    Component Value Date   HGBA1C 5.7 (H) 10/29/2019   HGBA1C 5.5 11/23/2018   HGBA1C CANCELED 03/24/2017   HGBA1C 5.3 03/24/2017   Lab Results  Component Value Date   INSULIN 39.3 (H) 10/29/2019   CBC    Component Value Date/Time   WBC 7.0 05/05/2018 1036   WBC 7.3 02/02/2017 0852   WBC 5.6 05/27/2016 1347   RBC 4.76 05/05/2018 1036   RBC 4.59 02/02/2017 0852   RBC 4.77 05/27/2016 1347   HGB 13.9 05/05/2018 1036   HCT 42.0 05/05/2018 1036   PLT 404 05/05/2018 1036   MCV 88 05/05/2018 1036   MCH 29.2 05/05/2018 1036   MCH 30.0 02/02/2017 0852   MCH 29.8 05/27/2016 1347   MCHC 33.1 05/05/2018 1036   MCHC 34.8 02/02/2017 0852   MCHC 34.0 05/27/2016 1347   RDW 14.2 05/05/2018 1036   LYMPHSABS 2.2 05/05/2018 1036   EOSABS 0.2 05/05/2018 1036   BASOSABS 0.0 05/05/2018 1036   Iron/TIBC/Ferritin/ %Sat No results found for: IRON, TIBC, FERRITIN, IRONPCTSAT Lipid Panel     Component Value Date/Time   CHOL 168 10/29/2019 0920   TRIG 212 (H) 10/29/2019 0920   HDL 35 (L) 10/29/2019 0920   CHOLHDL 3.3 11/23/2018 0000   CHOLHDL 5.5 (H) 05/27/2016 1347   VLDL 52 (H) 05/27/2016 1347   LDLCALC 97 10/29/2019 0920   Hepatic Function Panel     Component Value Date/Time   PROT 7.0 10/29/2019 0920   ALBUMIN 4.1 10/29/2019 0920   AST 22 10/29/2019 0920   ALT 28 10/29/2019 0920   ALKPHOS 70 10/29/2019 0920   BILITOT 0.3 10/29/2019 0920   BILIDIR 1.63 (H) 02/03/2017 0000      Component Value Date/Time   TSH 1.070 10/29/2019 0920   TSH 0.854 08/18/2017 1543   TSH 2.100 01/31/2017 0932    OBESITY BEHAVIORAL INTERVENTION VISIT DOCUMENTATION FOR INSURANCE (~15 minutes)  I, Marianna Payment, am acting as Energy manager for [employer], DO  I have reviewed the above documentation for accuracy and completeness, and I agree with the above. Corinna Capra, DO

## 2019-11-27 ENCOUNTER — Encounter (INDEPENDENT_AMBULATORY_CARE_PROVIDER_SITE_OTHER): Payer: Self-pay | Admitting: Bariatrics

## 2019-12-02 ENCOUNTER — Other Ambulatory Visit: Payer: Self-pay | Admitting: Internal Medicine

## 2019-12-02 DIAGNOSIS — E785 Hyperlipidemia, unspecified: Secondary | ICD-10-CM

## 2019-12-14 ENCOUNTER — Other Ambulatory Visit: Payer: Self-pay | Admitting: Internal Medicine

## 2019-12-14 DIAGNOSIS — I471 Supraventricular tachycardia: Secondary | ICD-10-CM

## 2019-12-14 DIAGNOSIS — F411 Generalized anxiety disorder: Secondary | ICD-10-CM

## 2019-12-24 ENCOUNTER — Ambulatory Visit (INDEPENDENT_AMBULATORY_CARE_PROVIDER_SITE_OTHER): Payer: Self-pay | Admitting: Psychology

## 2019-12-24 ENCOUNTER — Ambulatory Visit (INDEPENDENT_AMBULATORY_CARE_PROVIDER_SITE_OTHER): Payer: 59 | Admitting: Bariatrics

## 2019-12-30 ENCOUNTER — Other Ambulatory Visit: Payer: Self-pay | Admitting: Internal Medicine

## 2019-12-30 DIAGNOSIS — F411 Generalized anxiety disorder: Secondary | ICD-10-CM

## 2019-12-31 ENCOUNTER — Other Ambulatory Visit: Payer: Self-pay | Admitting: Internal Medicine

## 2019-12-31 DIAGNOSIS — E785 Hyperlipidemia, unspecified: Secondary | ICD-10-CM

## 2020-01-04 ENCOUNTER — Other Ambulatory Visit: Payer: Self-pay

## 2020-01-04 ENCOUNTER — Encounter: Payer: Self-pay | Admitting: Internal Medicine

## 2020-01-04 ENCOUNTER — Telehealth (INDEPENDENT_AMBULATORY_CARE_PROVIDER_SITE_OTHER): Payer: 59 | Admitting: Internal Medicine

## 2020-01-04 VITALS — Wt 291.0 lb

## 2020-01-04 DIAGNOSIS — M5442 Lumbago with sciatica, left side: Secondary | ICD-10-CM | POA: Diagnosis not present

## 2020-01-04 DIAGNOSIS — R7302 Impaired glucose tolerance (oral): Secondary | ICD-10-CM | POA: Diagnosis not present

## 2020-01-04 DIAGNOSIS — F331 Major depressive disorder, recurrent, moderate: Secondary | ICD-10-CM

## 2020-01-04 MED ORDER — PREDNISONE 10 MG (21) PO TBPK: ORAL_TABLET | ORAL | 0 refills | Status: DC

## 2020-01-04 NOTE — Progress Notes (Signed)
Virtual Visit via Video Note  I connected with Rose Cortez on 01/04/20 at  3:00 PM EST by a video enabled telemedicine application and verified that I am speaking with the correct person using two identifiers.  Location patient: home Location provider: work office Persons participating in the virtual visit: patient, provider  I discussed the limitations of evaluation and management by telemedicine and the availability of in person appointments. The patient expressed understanding and agreed to proceed.   HPI: She has scheduled this visit to discuss some acute concerns.  1.  She is wondering if her Celexa dose needs to be increased.  She has been taking 20 mg for about 18 months and has read that some people can develop tolerance.  2.  While at the [facility] clinic she was diagnosed with impaired glucose tolerance with an A1c of 5.7 and wants to know if she should be taking any medications or doing anything different to avoid progression to frank diabetes.  3.  She has had some acute back pain with sciatica in the past and it is again flaring up today.  She does have some radiation down the back of her leg on the left side.   ROS: Constitutional: Denies fever, chills, diaphoresis, appetite change and fatigue.  HEENT: Denies photophobia, eye pain, redness, hearing loss, ear pain, congestion, sore throat, rhinorrhea, sneezing, mouth sores, trouble swallowing, neck pain, neck stiffness and tinnitus.   Respiratory: Denies SOB, DOE, cough, chest tightness,  and wheezing.   Cardiovascular: Denies chest pain, palpitations and leg swelling.  Gastrointestinal: Denies nausea, vomiting, abdominal pain, diarrhea, constipation, blood in stool and abdominal distention.  Genitourinary: Denies dysuria, urgency, frequency, hematuria, flank pain and difficulty urinating.  Endocrine: Denies: hot or cold intolerance, sweats, changes in hair or nails, polyuria, polydipsia.  Musculoskeletal: Denies myalgias, back pain, joint swelling, arthralgias and gait problem.  Skin: Denies pallor, rash and wound.  Neurological: Denies dizziness, seizures, syncope, weakness, light-headedness, numbness and headaches.  Hematological: Denies adenopathy. Easy bruising, personal or family bleeding history  Psychiatric/Behavioral: Denies suicidal ideation, mood changes, confusion, nervousness, sleep disturbance and agitation   Past Medical History:  Diagnosis Date  . ADHD   . Anal fissure   . Anxiety   . Back pain   . Depression   . GERD (gastroesophageal reflux disease)   . High blood pressure   . History of PSVT (paroxysmal supraventricular tachycardia)    managed with Beta Blockers & Vagal Maneuvers.  Marland Kitchen HTN (hypertension)   . Hyperlipidemia   . 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 Liver problem   . Mononucleosis 02/2017  . Obesity   . Sleep apnea   . SVT (supraventricular tachycardia) ([place])   . Vitamin D deficiency     Past Surgical History:  Procedure Laterality Date  . COLPOSCOPY  10/2017  . TRANSTHORACIC ECHOCARDIOGRAM  03/2017   Normal EF 55-60%. GR 1 DD. Normal Valves - no MVP  . WISDOM TOOTH EXTRACTION      Family History  Problem Relation Age of Onset  . Hyperlipidemia Mother   . Breast cancer Mother 100       negative genetic testing  . High blood pressure Mother   . Anxiety disorder Mother   . Thyroid disease Mother   . Hyperlipidemia Father   . High blood pressure Father   . Anxiety disorder Father   . Depression Sister   . Heart disease Maternal Grandfather   . Diabetes  Maternal Grandfather   . Lung cancer Paternal Grandfather   . Heart attack Maternal Uncle     SOCIAL HX:   reports that she has never smoked. She has never used smokeless tobacco. She reports current alcohol use of about 1.0 - 2.0 standard drinks of alcohol per week. She reports that she does not use drugs.   Current Outpatient Medications:  .  buPROPion  (WELLBUTRIN SR) 150 MG 12 hr tablet, TAKE 1 TABLET BY MOUTH EVERY MORNING X 2 WEEKS, THEN 2 TABLETS BY MOUTH DAILY, Disp: 60 tablet, Rfl: 0 .  cholecalciferol (VITAMIN D) 1000 UNITS tablet, Take 1,000 Units by mouth daily. , Disp: , Rfl:  .  citalopram (CELEXA) 20 MG tablet, TAKE 1 TABLET BY MOUTH EVERY DAY, Disp: 90 tablet, Rfl: 1 .  metoprolol succinate (TOPROL-XL) 50 MG 24 hr tablet, TAKE 1 TABLET BY MOUTH EVERY DAY, Disp: 30 tablet, Rfl: 0 .  Multiple Vitamins-Minerals (HAIR/SKIN/NAILS/BIOTIN PO), Take 1 tablet by mouth 2 (two) times daily., Disp: , Rfl:  .  Omega-3 Fatty Acids (FISH OIL) 1000 MG CAPS, Take by mouth daily., Disp: , Rfl:  .  simvastatin (ZOCOR) 40 MG tablet, TAKE 1 TABLET BY MOUTH EVERY DAY, Disp: 90 tablet, Rfl: 1 .  Vitamin D, Ergocalciferol, (DRISDOL) 1.25 MG (50000 UT) CAPS capsule, Take 1 capsule (50,000 Units total) by mouth every 7 (seven) days., Disp: 4 capsule, Rfl: 0 .  predniSONE (STERAPRED UNI-PAK 21 TAB) 10 MG (21) TBPK tablet, Take as directed, Disp: 21 tablet, Rfl: 0  EXAM:   VITALS per patient if applicable: None reported  GENERAL: alert, oriented, appears well and in no acute distress  HEENT: atraumatic, conjunttiva clear, no obvious abnormalities on inspection of external nose and ears  NECK: normal movements of the head and neck  LUNGS: on inspection no signs of respiratory distress, breathing rate appears normal, no obvious gross increased work of breathing, gasping or wheezing  CV: no obvious cyanosis  MS: moves all visible extremities without noticeable abnormality  PSYCH/NEURO: pleasant and cooperative, no obvious depression or anxiety, speech and thought processing grossly intact  ASSESSMENT AND PLAN:   Moderate episode of recurrent major depressive disorder (HCC) -She actually scored a 0 on PHQ-9 today. -Do not believe we need to increaseHer Celexa dose at this time.  IGT (impaired glucose tolerance) -A1c of 5.7 in November 2020. -Have  advised weight loss and other lifestyle modifications to avoid progression to frank diabetes.  No medications needed at this time.  Acute bilateral low back pain with left-sided sciatica -We have discussed conservative measures such as icing, rotating Tylenol and ibuprofen, back stretches.  Can consider referral to physical therapy. -Long-term she would benefit from weight loss. -6-day prednisone taper ordered.    I discussed the assessment and treatment plan with the patient. The patient was provided an opportunity to ask questions and all were answered. The patient agreed with the plan and demonstrated an understanding of the instructions.   The patient was advised to call back or seek an in-person evaluation if the symptoms worsen or if the condition fails to improve as anticipated.    Chaya Jan, MD  [facility]

## 2020-01-21 ENCOUNTER — Other Ambulatory Visit: Payer: Self-pay | Admitting: Internal Medicine

## 2020-01-21 DIAGNOSIS — I471 Supraventricular tachycardia: Secondary | ICD-10-CM

## 2020-01-29 ENCOUNTER — Encounter: Payer: Self-pay | Admitting: Internal Medicine

## 2020-01-30 ENCOUNTER — Encounter: Payer: Self-pay | Admitting: Internal Medicine

## 2020-01-30 NOTE — Telephone Encounter (Signed)
Okay to write note.

## 2020-02-06 ENCOUNTER — Telehealth: Payer: Self-pay | Admitting: Cardiology

## 2020-02-06 NOTE — Telephone Encounter (Signed)
Called patient to schedule follow up appt from recall. She stated she feels she doesn't need a follow up visit at this time.

## 2020-02-15 ENCOUNTER — Encounter: Payer: Self-pay | Admitting: Family Medicine

## 2020-02-15 ENCOUNTER — Other Ambulatory Visit: Payer: Self-pay

## 2020-02-15 ENCOUNTER — Ambulatory Visit (INDEPENDENT_AMBULATORY_CARE_PROVIDER_SITE_OTHER): Payer: 59 | Admitting: Family Medicine

## 2020-02-15 VITALS — BP 136/88 | HR 103 | Temp 96.4°F | Ht 68.0 in | Wt 296.3 lb

## 2020-02-15 DIAGNOSIS — M5416 Radiculopathy, lumbar region: Secondary | ICD-10-CM

## 2020-02-15 MED ORDER — PREDNISONE 10 MG PO TABS: ORAL_TABLET | ORAL | 0 refills | Status: DC

## 2020-02-15 MED ORDER — KETOROLAC TROMETHAMINE 60 MG/2ML IM SOLN
60.0000 mg | Freq: Once | INTRAMUSCULAR | Status: AC
  Administered 2020-02-15: 60 mg via INTRAMUSCULAR

## 2020-02-15 MED ORDER — HYDROCODONE-ACETAMINOPHEN 10-325 MG PO TABS: 1.0000 | ORAL_TABLET | Freq: Four times a day (QID) | ORAL | 0 refills | Status: DC | PRN

## 2020-02-15 NOTE — Patient Instructions (Signed)
Lumbosacral Radiculopathy Lumbosacral radiculopathy is a condition that involves the spinal nerves and nerve roots in the low back and bottom of the spine. The condition develops when these nerves and nerve roots move out of place or become inflamed and cause symptoms. What are the causes? This condition may be caused by:  Pressure from a disk that bulges out of place (herniated disk). A disk is a plate of soft cartilage that separates bones in the spine.  Disk changes that occur with age (disk degeneration).  A narrowing of the bones of the lower back (spinal stenosis).  A tumor.  An infection.  An injury that places sudden pressure on the disks that cushion the bones of your lower spine. What increases the risk? You are more likely to develop this condition if:  You are a female who is 30-50 years old.  You are a female who is 50-60 years old.  You use improper technique when lifting things.  You are overweight or live a sedentary lifestyle.  Your work requires frequent lifting.  You smoke.  You do repetitive activities that strain the spine. What are the signs or symptoms? Symptoms of this condition include:  Pain that goes down from your back into your legs (sciatica), usually on one side of the body. This is the most common symptom. The pain may be worse with sitting, coughing, or sneezing.  Pain and numbness in your legs.  Muscle weakness.  Tingling.  Loss of bladder control or bowel control. How is this diagnosed? This condition may be diagnosed based on:  Your symptoms and medical history.  A physical exam. If the pain is lasting, you may have tests, such as:  MRI scan.  X-ray.  CT scan.  A type of X-ray used to examine the spinal canal after injecting a dye into your spine (myelogram).  A test to measure how electrical impulses move through a nerve (nerve conduction study). How is this treated? Treatment may depend on the cause of the condition and  may include:  Working with a physical therapist.  Taking pain medicine.  Applying heat and ice to affected areas.  Doing stretches to improve flexibility.  Doing exercises to strengthen back muscles.  Having chiropractic spinal manipulation.  Using transcutaneous electrical nerve stimulation (TENS) therapy.  Getting a steroid injection in the spine. In some cases, no treatment is needed. If the condition is long-lasting (chronic), or if symptoms are severe, treatment may involve surgery or lifestyle changes, such as following a weight-loss plan. Follow these instructions at home: Activity  Avoid bending and other activities that make the problem worse.  Maintain a proper position when standing or sitting: ? When standing, keep your upper back and neck straight, with your shoulders pulled back. Avoid slouching. ? When sitting, keep your back straight and relax your shoulders. Do not round your shoulders or pull them backward.  Do not sit or stand in one place for long periods of time.  Take brief periods of rest throughout the day. This will reduce your pain. It is usually better to rest by lying down or standing, not sitting.  When you are resting for longer periods, mix in some mild activity or stretching between periods of rest. This will help to prevent stiffness and pain.  Get regular exercise. Ask your health care provider what activities are safe for you. If you were shown how to do any exercises or stretches, do them as directed by your health care provider.  Do   not lift anything that is heavier than 10 lb (4.5 kg) or the limit that you are told by your health care provider. Always use proper lifting technique, which includes: ? Bending your knees. ? Keeping the load close to your body. ? Avoiding twisting. Managing pain  If directed, put ice on the affected area: ? Put ice in a plastic bag. ? Place a towel between your skin and the bag. ? Leave the ice on for 20  minutes, 2-3 times a day.  If directed, apply heat to the affected area as often as told by your health care provider. Use the heat source that your health care provider recommends, such as a moist heat pack or a heating pad. ? Place a towel between your skin and the heat source. ? Leave the heat on for 20-30 minutes. ? Remove the heat if your skin turns bright red. This is especially important if you are unable to feel pain, heat, or cold. You may have a greater risk of getting burned.  Take over-the-counter and prescription medicines only as told by your health care provider. General instructions  Sleep on a firm mattress in a comfortable position. Try lying on your side with your knees slightly bent. If you lie on your back, put a pillow under your knees.  Do not drive or use heavy machinery while taking prescription pain medicine.  If your health care provider prescribed a diet or exercise program, follow it as directed.  Keep all follow-up visits as told by your health care provider. This is important. Contact a health care provider if:  Your pain does not improve over time, even when taking pain medicines. Get help right away if:  You develop severe pain.  Your pain suddenly gets worse.  You develop increasing weakness in your legs.  You lose the ability to control your bladder or bowel.  You have difficulty walking or balancing.  You have a fever. Summary  Lumbosacral radiculopathy is a condition that occurs when the spinal nerves and nerve roots in the lower part of the spine move out of place or become inflamed and cause symptoms.  Symptoms include pain, numbness, and tingling that go down from your back into your legs (sciatica), muscle weakness, and loss of bladder control or bowel control.  If directed, apply ice or heat to the affected area as told by your health care provider.  Follow instructions about activity, rest, and proper lifting technique. This  information is not intended to replace advice given to you by your health care provider. Make sure you discuss any questions you have with your health care provider. Document Revised: 11/17/2017 Document Reviewed: 11/17/2017 Elsevier Patient Education  2020 [employer]  

## 2020-02-15 NOTE — Progress Notes (Signed)
Subjective:     Patient ID: Rose Cortez, female   DOB: [birth year], 34 y.o.   MRN: [id number]  HPI Khristine is seen with progressive left lumbar back pain.  She states she has had chronic back pain for about 6 years but has had some severe pain progressive since around January.  She went on brief prednisone at that time with minimal improvement.  She had been taking ibuprofen and Tylenol with minimal relief.  Location is left lumbar around L4-L5 and L5-S1 with radiation into the buttock and occasionally down toward the knee.  She has not had any numbness or weakness.  She rates her pain 10 out of 10 at times.  Worse with sitting and change of position.  She has had difficulty sleeping secondary to pain.  She has seen both chiropractor and physical therapist in the past without much improvement.  She had some recent weight gain which she realizes may be exacerbating.  Denies any recent injury.  She has children ages 69 and 22 years old and she thinks some of her issues have been related to lifting them.  Past Medical History:  Diagnosis Date  . ADHD   . Anal fissure   . Anxiety   . Back pain   . Depression   . GERD (gastroesophageal reflux disease)   . High blood pressure   . History of PSVT (paroxysmal supraventricular tachycardia)    managed with Beta Blockers & Vagal Maneuvers.  Marland Kitchen HTN (hypertension)   . Hyperlipidemia   . 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 Liver problem   . Mononucleosis 02/2017  . Obesity   . Sleep apnea   . SVT (supraventricular tachycardia) (HCC)   . Vitamin D deficiency    Past Surgical History:  Procedure Laterality Date  . COLPOSCOPY  10/2017  . TRANSTHORACIC ECHOCARDIOGRAM  03/2017   Normal EF 55-60%. GR 1 DD. Normal Valves - no MVP  . WISDOM TOOTH EXTRACTION      reports that she has never smoked. She has never used smokeless tobacco. She reports current alcohol use of about 1.0 - 2.0 standard drinks of alcohol per week. She reports that she does not  use drugs. family history includes Anxiety disorder in her father and mother; Breast cancer (age of onset: 69) in her mother; Depression in her sister; Diabetes in her maternal grandfather; Heart attack in her maternal uncle; Heart disease in her maternal grandfather; High blood pressure in her father and mother; Hyperlipidemia in her father and mother; Lung cancer in her paternal grandfather; Thyroid disease in her mother. No Known Allergies   Review of Systems  Constitutional: Negative for appetite change, chills, fever and unexpected weight change.  Gastrointestinal: Negative for abdominal pain.  Musculoskeletal: Positive for back pain.  Neurological: Negative for weakness and numbness.       Objective:   Physical Exam Vitals reviewed.  Constitutional:      Appearance: Normal appearance.  Cardiovascular:     Rate and Rhythm: Normal rate and regular rhythm.  Pulmonary:     Effort: Pulmonary effort is normal.     Breath sounds: Normal breath sounds.  Musculoskeletal:     Right lower leg: No edema.     Left lower leg: No edema.     Comments: Straight leg raise is negative on the left  Neurological:     Mental Status: She is alert.     Comments: She has good strength with plantarflexion, dorsiflexion, and knee extension  bilaterally.  She has 1+ reflexes knee and ankle bilaterally        Assessment:     Chronic left lumbar radiculopathy with worsening over the past few months.  She tried multiple conservative measures including Tylenol, ibuprofen, muscle relaxers, physical therapy, chiropractic care without improvement.  Pain has been severe at times.    Plan:     -Given duration of 6 years of pain and worsening severity over many months and lack of response to multiple conservative therapies we recommended MRI to further assess  -She was given Toradol 60 mg IM  -Limited hydrocodone 5 mg 1 every 6 hours as needed for severe pain (#20).  She is aware of potential for  sedation  -We elected to give her 1 more taper of prednisone for the next week  Eulas Post MD [facility]

## 2020-02-20 ENCOUNTER — Telehealth: Payer: Self-pay | Admitting: Internal Medicine

## 2020-02-20 NOTE — Telephone Encounter (Signed)
Last OV 02/15/20 with you. Please see message.

## 2020-02-20 NOTE — Telephone Encounter (Signed)
Pt is calling in stating that she is still having the same back pain from Friday and would like to see if something can be sent in.  Pharm:  [facility] [address] [employer]

## 2020-02-21 NOTE — Telephone Encounter (Signed)
Pt stated that she spoke to someone yesterday about her back pain and was told that a prescription would be sent into the pharmacy yesterday but nothing was called in. She also stated that a MRI referral would be sent also. She would like a call back about the medication and referral.   Informed pt that [facility] does not work Thursday. Pt understood and said she will go to the ER for her pain but would still like a call back about the MRI and prescription.   [facility]: [facility] [address] FAX: [phone]   Pt can be reached [phone]

## 2020-02-21 NOTE — Telephone Encounter (Signed)
Patient notified of update  and verbalized understanding. Pt declined office visit with PCP right now. Pt is currently at the [facility] urgent care . Pt stated that she is still taking prednisone this is her last day. Pt advised to call back if she still will need to schedule an appt. With PCP after UC visit.

## 2020-02-21 NOTE — Telephone Encounter (Signed)
The MRI has been ordered and we are waiting on insurance approval and for radiology to get scheduled.  We prescribed Vicodin the other day for pain.  If pain not controlled with that needs follow up with primary to discuss while we are waiting on the MRI.  Make sure she is taking the prednisone.

## 2020-03-06 ENCOUNTER — Ambulatory Visit: Payer: 59 | Attending: Internal Medicine

## 2020-03-06 DIAGNOSIS — Z23 Encounter for immunization: Secondary | ICD-10-CM

## 2020-03-06 NOTE — Progress Notes (Signed)
   Covid-19 Vaccination Clinic  Name:  Rose Cortez    MRN: [id number] DOB: 1986-04-29  03/06/2020  [name] was observed post Covid-19 immunization for 15 minutes without incident. She was provided with Vaccine Information Sheet and instruction to access the V-Safe system.   [name] was instructed to call 911 with any severe reactions post vaccine: Marland Kitchen Difficulty breathing  . Swelling of face and throat  . A fast heartbeat  . A bad rash all over body  . Dizziness and weakness   Immunizations Administered    Name Date Dose VIS Date Route   Pfizer COVID-19 Vaccine 03/06/2020  3:24 PM 0.3 mL 11/23/2019 Intramuscular   Manufacturer: [employer], [employer]   Lot: [id number]   NDC: [address]-8

## 2020-03-10 ENCOUNTER — Telehealth: Payer: Self-pay | Admitting: Internal Medicine

## 2020-03-10 ENCOUNTER — Encounter (INDEPENDENT_AMBULATORY_CARE_PROVIDER_SITE_OTHER): Payer: Self-pay

## 2020-03-10 NOTE — Telephone Encounter (Signed)
Pt came in dropped off a Surgical Clearance form to be completed by the provider. Upon competion pt would like form to be faxed to [facility] fax # [phone].Placed in Dr. Ardyth Harps folder. Thanks

## 2020-03-11 NOTE — Telephone Encounter (Signed)
Placed in Dr Hernandez's folder 

## 2020-03-11 NOTE — Telephone Encounter (Signed)
appointment schedule for 03/21/20.

## 2020-03-11 NOTE — Telephone Encounter (Signed)
Pt returned phone call, would like a call back.  

## 2020-03-11 NOTE — Telephone Encounter (Signed)
Attempted to call patient to schedule a pre-op appointment but the voicemail box is full. Form at my desk.

## 2020-03-12 ENCOUNTER — Telehealth: Payer: Self-pay | Admitting: Internal Medicine

## 2020-03-12 NOTE — Telephone Encounter (Signed)
Ok to schedule first available 30 min slot

## 2020-03-12 NOTE — Telephone Encounter (Signed)
Pt called to see if her appt for 03/21/20 could be moved up, she requested 03/18/20 when I explained that Dr. Ardyth Harps did not have any openings that day she became upset. She requested to see another provider so that she could be seen sooner. Please follow up with pt if we can get her in sooner with Dr. Ardyth Harps or if it is ok to schedule with another provider for surgical clearance.

## 2020-03-13 NOTE — Telephone Encounter (Signed)
Left vm for pt to call back so that we can schedule her for Dr. Ardyth Harps first 30 min appt.

## 2020-03-19 ENCOUNTER — Encounter: Payer: Self-pay | Admitting: Internal Medicine

## 2020-03-19 ENCOUNTER — Other Ambulatory Visit: Payer: Self-pay

## 2020-03-19 ENCOUNTER — Ambulatory Visit (INDEPENDENT_AMBULATORY_CARE_PROVIDER_SITE_OTHER): Payer: 59 | Admitting: Internal Medicine

## 2020-03-19 VITALS — BP 124/84 | HR 92 | Temp 98.2°F | Wt 290.5 lb

## 2020-03-19 DIAGNOSIS — G8929 Other chronic pain: Secondary | ICD-10-CM

## 2020-03-19 DIAGNOSIS — E785 Hyperlipidemia, unspecified: Secondary | ICD-10-CM | POA: Diagnosis not present

## 2020-03-19 DIAGNOSIS — M5442 Lumbago with sciatica, left side: Secondary | ICD-10-CM

## 2020-03-19 DIAGNOSIS — R7309 Other abnormal glucose: Secondary | ICD-10-CM

## 2020-03-19 DIAGNOSIS — R7303 Prediabetes: Secondary | ICD-10-CM

## 2020-03-19 DIAGNOSIS — Z01818 Encounter for other preprocedural examination: Secondary | ICD-10-CM | POA: Diagnosis not present

## 2020-03-19 LAB — POCT GLYCOSYLATED HEMOGLOBIN (HGB A1C): Hemoglobin A1C: 5.5 % (ref 4.0–5.6)

## 2020-03-19 NOTE — Progress Notes (Signed)
Established Patient Office Visit     This visit occurred during the SARS-CoV-2 public health emergency.  Safety protocols were in place, including screening questions prior to the visit, additional usage of staff PPE, and extensive cleaning of exam room while observing appropriate contact time as indicated for disinfecting solutions.    CC/Reason for Visit: Pre-op clearance  HPI: Rose Cortez is a 34 y.o. female who is coming in today for the above mentioned reasons. Past Medical History is significant for: ADD, depression and generalized anxiety well controlled on Celexa and Wellbutrin, hyperlipidemia, morbid obesity.  Since January she has been having lower back pain and left-sided radiculopathy.  She finally saw orthopedics and had an MRI, Ortho believe she needs an L4/L5 discectomy she is here today for preop clearance.  She has no complaints today other than significant concerns about finances as she has been told by orthopedics that she will need to take 4 months off work following her surgery.   Past Medical/Surgical History: Past Medical History:  Diagnosis Date  . ADHD   . Anal fissure   . Anxiety   . Back pain   . Depression   . GERD (gastroesophageal reflux disease)   . High blood pressure   . History of PSVT (paroxysmal supraventricular tachycardia)    managed with Beta Blockers & Vagal Maneuvers.  Marland Kitchen HTN (hypertension)   . Hyperlipidemia   . 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 Liver problem   . Mononucleosis 02/2017  . Obesity   . Sleep apnea   . SVT (supraventricular tachycardia) (HCC)   . Vitamin D deficiency     Past Surgical History:  Procedure Laterality Date  . COLPOSCOPY  10/2017  . TRANSTHORACIC ECHOCARDIOGRAM  03/2017   Normal EF 55-60%. GR 1 DD. Normal Valves - no MVP  . WISDOM TOOTH EXTRACTION      Social History:  reports that she has never smoked. She has never used smokeless tobacco. She reports current alcohol use of about 1.0 -  2.0 standard drinks of alcohol per week. She reports that she does not use drugs.  Allergies: No Known Allergies  Family History:  Family History  Problem Relation Age of Onset  . Hyperlipidemia Mother   . Breast cancer Mother 41       negative genetic testing  . High blood pressure Mother   . Anxiety disorder Mother   . Thyroid disease Mother   . Hyperlipidemia Father   . High blood pressure Father   . Anxiety disorder Father   . Depression Sister   . Heart disease Maternal Grandfather   . Diabetes Maternal Grandfather   . Lung cancer Paternal Grandfather   . Heart attack Maternal Uncle      Current Outpatient Medications:  .  acetaminophen (TYLENOL) 325 MG tablet, Take 650 mg by mouth every 6 (six) hours as needed., Disp: , Rfl:  .  buPROPion (WELLBUTRIN SR) 150 MG 12 hr tablet, TAKE 1 TABLET BY MOUTH EVERY MORNING X 2 WEEKS, THEN 2 TABLETS BY MOUTH DAILY, Disp: 60 tablet, Rfl: 0 .  cholecalciferol (VITAMIN D) 1000 UNITS tablet, Take 1,000 Units by mouth daily. , Disp: , Rfl:  .  citalopram (CELEXA) 20 MG tablet, TAKE 1 TABLET BY MOUTH EVERY DAY, Disp: 90 tablet, Rfl: 1 .  gabapentin (NEURONTIN) 300 MG capsule, Take 300 mg by mouth 3 (three) times daily., Disp: , Rfl:  .  ibuprofen (ADVIL) 200 MG tablet, Take  200 mg by mouth every 6 (six) hours as needed., Disp: , Rfl:  .  metoprolol succinate (TOPROL-XL) 50 MG 24 hr tablet, TAKE 1 TABLET BY MOUTH EVERY DAY, Disp: 90 tablet, Rfl: 1 .  Multiple Vitamins-Minerals (HAIR/SKIN/NAILS/BIOTIN PO), Take 1 tablet by mouth 2 (two) times daily., Disp: , Rfl:  .  Omega-3 Fatty Acids (FISH OIL) 1000 MG CAPS, Take by mouth daily., Disp: , Rfl:  .  predniSONE (STERAPRED UNI-PAK 21 TAB) 10 MG (21) TBPK tablet, Take as directed, Disp: 21 tablet, Rfl: 0 .  simvastatin (ZOCOR) 40 MG tablet, TAKE 1 TABLET BY MOUTH EVERY DAY, Disp: 90 tablet, Rfl: 1 .  Vitamin D, Ergocalciferol, (DRISDOL) 1.25 MG (50000 UT) CAPS capsule, Take 1 capsule (50,000 Units  total) by mouth every 7 (seven) days., Disp: 4 capsule, Rfl: 0 .  oxyCODONE-acetaminophen (PERCOCET) 10-325 MG tablet, Per Ortho, Disp: , Rfl:   Review of Systems:  Constitutional: Denies fever, chills, diaphoresis, appetite change and fatigue.  HEENT: Denies photophobia, eye pain, redness, hearing loss, ear pain, congestion, sore throat, rhinorrhea, sneezing, mouth sores, trouble swallowing, neck pain, neck stiffness and tinnitus.   Respiratory: Denies SOB, DOE, cough, chest tightness,  and wheezing.   Cardiovascular: Denies chest pain, palpitations and leg swelling.  Gastrointestinal: Denies nausea, vomiting, abdominal pain, diarrhea, constipation, blood in stool and abdominal distention.  Genitourinary: Denies dysuria, urgency, frequency, hematuria, flank pain and difficulty urinating.  Endocrine: Denies: hot or cold intolerance, sweats, changes in hair or nails, polyuria, polydipsia. Musculoskeletal: Denies joint swelling, arthralgias. Skin: Denies pallor, rash and wound.  Neurological: Denies dizziness, seizures, syncope, weakness, light-headedness, numbness and headaches.  Hematological: Denies adenopathy. Easy bruising, personal or family bleeding history  Psychiatric/Behavioral: Denies suicidal ideation, mood changes, confusion, nervousness, sleep disturbance and agitation    Physical Exam: Vitals:   03/19/20 1500  BP: 124/84  Pulse: 92  Temp: 98.2 F (36.8 C)  TempSrc: Temporal  SpO2: 97%  Weight: 290 lb 8 oz (131.8 kg)    Body mass index is 44.17 kg/m.   Constitutional: NAD, calm, comfortable, obese Eyes: PERRL, lids and conjunctivae normal ENMT: Mucous membranes are moist.  Respiratory: clear to auscultation bilaterally, no wheezing, no crackles. Normal respiratory effort. No accessory muscle use.  Cardiovascular: Regular rate and rhythm, no murmurs / rubs / gallops. No extremity edema. Neurologic: Grossly intact and nonfocal Psychiatric: Normal judgment and  insight. Alert and oriented x 3. Normal mood.    Impression and Plan:  Pre-op examination  -She is not limited in activity because of chest pain or shortness of breath, only because of back pain. -EKG today shows sinus rhythm with no acute ischemic changes. -She has impaired glucose tolerance but this is well controlled with an A1c today of 5.5.  She is not hypertensive, does not have heart failure. -She is class I risk for surgery which confers 3.9% of 30-day risk of death, MI, cardiac arrest.  Okay to proceed to surgery with further work-up.  Prediabetes -A1c has improved to 5.5.  Dyslipidemia -LDL was 98 in 2019. -Is on simvastatin.  Chronic bilateral low back pain with left-sided sciatica -Under the care of orthopedics, scheduled for back surgery this month.   Patient Instructions  -Nice seeing you today!!  -Ok to proceed to surgery.     Lelon Frohlich, MD [facility] at [facility]

## 2020-03-19 NOTE — Patient Instructions (Signed)
-  Nice seeing you today!!  -Ok to proceed to surgery.

## 2020-03-21 ENCOUNTER — Ambulatory Visit: Payer: Self-pay | Admitting: Internal Medicine

## 2020-03-24 ENCOUNTER — Ambulatory Visit: Payer: Self-pay | Admitting: Orthopedic Surgery

## 2020-03-25 ENCOUNTER — Ambulatory Visit: Payer: Self-pay | Admitting: Orthopedic Surgery

## 2020-03-25 NOTE — H&P (Deleted)
  The note originally documented on this encounter has been moved the the encounter in which it belongs.  

## 2020-03-25 NOTE — H&P (Signed)
Subjective:   Rose Cortez is a pleasant 34 year old female past medical for morbid obesity (BMI 44.9), sleep apnea, does not use a CPAP machine, and an irregular heartbeat for which she has been evaluated by cardiology in the past in it was found to be "nondangerous" He was in her normal state of health until Last month when the patient began experiencing pain and radicular left leg pain as well as left leg weakness. The patient was found to have a large disc herniation and therefore the patient would like to move forward with surgical intervention. She is scheduled for Left L4-5 Discectomy at [facility] With Dr. Rolena Infante on 04/02/2020.  Patient Active Problem List   Diagnosis Date Noted  . Prediabetes 10/31/2019  . Psychophysiological insomnia 07/05/2018  . 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 PSVT (paroxysmal supraventricular tachycardia) ([place]) 03/18/2017  . Depression 08/20/2016  . GAD (generalized anxiety disorder) 04/14/2015  . 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 Dyslipidemia 04/14/2015   Past Medical History:  Diagnosis Date  . ADHD   . Anal fissure   . Anxiety   . Back pain   . Depression   . GERD (gastroesophageal reflux disease)   . High blood pressure   . History of PSVT (paroxysmal supraventricular tachycardia)    managed with Beta Blockers & Vagal Maneuvers.  Marland Kitchen HTN (hypertension)   . Hyperlipidemia   . 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 Liver problem   . Mononucleosis 02/2017  . Obesity   . Sleep apnea   . SVT (supraventricular tachycardia) ([place])   . Vitamin D deficiency     Past Surgical History:  Procedure Laterality Date  . COLPOSCOPY  10/2017  . TRANSTHORACIC ECHOCARDIOGRAM  03/2017   Normal EF 55-60%. GR 1 DD. Normal Valves - no MVP  . WISDOM TOOTH EXTRACTION      Current Outpatient Medications  Medication Sig Dispense Refill Last Dose  . acetaminophen (TYLENOL) 325 MG tablet Take 650 mg by  mouth every 6 (six) hours as needed for moderate pain.      Marland Kitchen buPROPion (WELLBUTRIN SR) 150 MG 12 hr tablet TAKE 1 TABLET BY MOUTH EVERY MORNING X 2 WEEKS, THEN 2 TABLETS BY MOUTH DAILY (Patient taking differently: Take 150 mg by mouth 2 (two) times daily. ) 60 tablet 0   . cholecalciferol (VITAMIN D) 1000 UNITS tablet Take 1,000 Units by mouth daily.      . citalopram (CELEXA) 20 MG tablet TAKE 1 TABLET BY MOUTH EVERY DAY 90 tablet 1   . gabapentin (NEURONTIN) 300 MG capsule Take 300 mg by mouth 3 (three) times daily.     Marland Kitchen ibuprofen (ADVIL) 200 MG tablet Take 200 mg by mouth every 6 (six) hours as needed for moderate pain.      . metoprolol succinate (TOPROL-XL) 50 MG 24 hr tablet TAKE 1 TABLET BY MOUTH EVERY DAY 90 tablet 1   . Multiple Vitamins-Minerals (HAIR/SKIN/NAILS/BIOTIN PO) Take 1 tablet by mouth 2 (two) times daily.     Marland Kitchen oxyCODONE-acetaminophen (PERCOCET) 10-325 MG tablet Take 1 tablet by mouth every 8 (eight) hours as needed for pain. Per Ortho     . simvastatin (ZOCOR) 40 MG tablet TAKE 1 TABLET BY MOUTH EVERY DAY 90 tablet 1    No current facility-administered medications for this visit.   No Known Allergies  Social History   Tobacco Use  . Smoking status:  Never Smoker  . Smokeless tobacco: Never Used  Substance Use Topics  . Alcohol use: Yes    Alcohol/week: 1.0 - 2.0 standard drinks    Types: 1 - 2 Standard drinks or equivalent per week    Family History  Problem Relation Age of Onset  . Hyperlipidemia Mother   . Breast cancer Mother 29       negative genetic testing  . High blood pressure Mother   . Anxiety disorder Mother   . Thyroid disease Mother   . Hyperlipidemia Father   . High blood pressure Father   . Anxiety disorder Father   . Depression Sister   . Heart disease Maternal Grandfather   . Diabetes Maternal Grandfather   . Lung cancer Paternal Grandfather   . Heart attack Maternal Uncle     Review of Systems As stated in HPI  Objective:    Vitals: Ht: 5 ft 7.75 in 03/25/2020 03:16 pm Wt: 291 lbs 03/25/2020 03:17 pm BMI: 44.6 03/25/2020 03:17 pm BP: 139/90 03/25/2020 03:17 pm Pulse: 96 bpm 03/25/2020 03:17 pm Pain Scale: 8 03/25/2020 03:17 pm  Clinical exam: Rose Cortez is a pleasant individual, who appears younger than their stated age. She is alert and orientated 3. Negative: skin lesions abrasions contusions  Peripheral pulses: 2+ dorsalis pedis/posterior tibialis pulses bilaterally. Compartment soft and nontender.  Gait pattern: Altered gait pattern due to left foot drop  Assistive devices: None Heart: Regular rate and rhythm, no rubs, murmurs, or gallops Lungs: Clear to auscultation bilaterally Abdomen: Bowel sounds 4, nontender, nondistended, no loss of bladder or bowel control.  Neuro: 4/5 left EHL/tibialis anterior strength remainder is 5/5. Positive left straight leg raise test with reproduction of radicular leg pain. Negative Babinski test, positive numbness and dysesthesias in the left L5 dermatome. Negative Babinski test, no clonus, 1+ deep tendon reflexes at the knee and absent at the Achilles.  Musculoskeletal: Significant low back pain especially with forward flexion that radiates into the lower extremity. No SI joint pain. No hip, knee, ankle pain with isolated joint range of motion.  X-rays of the lumbar spine dated 02/21/20 were reviewed: No scoliosis or fracture is seen. No spondylolisthesis. Degenerative lumbar disc disease with greater than 50% collapse at L5-S1 moderate degenerative disc disease L4-5.  Lumbar MRI: completed on 03/03/20 was reviewed with the patient. [facility] was completed at [facility]; I have independently reviewed the images as well as the radiology report. No disc herniation, stenosis L1-4. Large right central and to the left disc extrusion with caudal migration displacing the left greater than right L5 nerve root. There is also some impingement on the left S1 nerve root. Degenerative  collapse of the L5-S1 disc with hard disc osteophyte but only mild foraminal stenosis no significant central stenosis.  Assessment:   Rose Cortez is a pleasant 34 year old female past medical for morbid obesity (BMI 44.9), sleep apnea, does not use a CPAP machine, and an irregular heartbeat (PSVT) for which she has been evaluated by cardiology in the past in it was found to be "nondangerous" He was in her normal state of health until Last month when the patient began experiencing pain and radicular left leg pain as well as left leg weakness. On exam she has radicular leg pain in the left L5 dermatome pattern as well as left anterior tib and EHL weakness. 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  Plan:   Proceed with left L4-5 microdiscectomy at [facility] with Dr. Shon Baton on 04/02/20 pending preoperative labs.   We have obtained preoperative clearance from the patient's primary care provider.   I have reviewed the patient's medication list with her. She is not on any blood thinners or aspirin I did advise her to avoid any over-the-counter anti-inflammatory medications leading up to surgical intervention and she did express understanding of this. Currently she is taking oxycodone/acetaminophen and gabapentin as needed for pain which she may continue.   We have also discussed the post-operative recovery period to include: bathing/showering restrictions, wound healing, activity (and driving) restrictions, medications/pain mangement.   We have also discussed post-operative redflags to include: signs and symptoms of postoperative infection, DVT/PE.   Patient has picked up her LSO brace for [employer].   All patients questions were invited and answered.   Follow-up: 2 weeks postoperatively

## 2020-03-31 ENCOUNTER — Ambulatory Visit: Payer: 59

## 2020-03-31 NOTE — Pre-Procedure Instructions (Signed)
[id number] [place], [place] - [address] [place] [address] Phone: [phone] Fax: [phone]     Your procedure is scheduled on Wednesday April 21, from 12:00 PM- 2:30 PM .  Report to [facility] Main Entrance "A" at 10:00 A.M., and check in at the Admitting office.  Call this number if you have problems the morning of surgery:  [phone]  Call [phone] if you have any questions prior to your surgery date Monday-Friday 8am-4pm.    Remember:  Do not eat or drink after midnight the night before your surgery.   Take these medicines the morning of surgery with A SIP OF WATER : buPROPion (WELLBUTRIN SR) citalopram (CELEXA)  gabapentin (NEURONTIN)  metoprolol succinate (TOPROL-XL) simvastatin (ZOCOR)  IF NEEDED: acetaminophen (TYLENOL) oxyCODONE-acetaminophen (PERCOCET)  As of today, STOP taking any Aspirin (unless otherwise instructed by your surgeon) and Aspirin containing products, Aleve, Naproxen, Ibuprofen, Motrin, Advil, Goody's, BC's, all herbal medications, fish oil, and all vitamins.                      Do not wear jewelry, make up, or nail polish.            Do not wear lotions, powders, perfumes, or deodorant.            Do not shave 48 hours prior to surgery.              Do not bring valuables to the hospital.            [facility] is not responsible for any belongings or valuables.  Do NOT Smoke (Tobacco/Vapping) or drink Alcohol 24 hours prior to your procedure.  If you use a CPAP at night, you may bring all equipment for your overnight stay.   Contacts, glasses, dentures or bridgework may not be worn into surgery.      For patients admitted to the hospital, discharge time will be determined by your treatment team.   Patients discharged the day of surgery will not be allowed to drive home, and someone needs to stay with them for 24 hours.    Special instructions:   [facility]- Preparing For  Surgery  Before surgery, you can play an important role. Because skin is not sterile, your skin needs to be as free of germs as possible. You can reduce the number of germs on your skin by washing with CHG (chlorahexidine gluconate) Soap before surgery.  CHG is an antiseptic cleaner which kills germs and bonds with the skin to continue killing germs even after washing.    Oral Hygiene is also important to reduce your risk of infection.  Remember - BRUSH YOUR TEETH THE MORNING OF SURGERY WITH YOUR REGULAR TOOTHPASTE  Please do not use if you have an allergy to CHG or antibacterial soaps. If your skin becomes reddened/irritated stop using the CHG.  Do not shave (including legs and underarms) for at least 48 hours prior to first CHG shower. It is OK to shave your face.  Please follow these instructions carefully.   1. Shower the NIGHT BEFORE SURGERY and the MORNING OF SURGERY with CHG Soap.   2. If you chose to wash your hair, wash your hair first as usual with your normal shampoo.  3. After you shampoo, rinse your hair and body thoroughly to remove the shampoo.  4. Use CHG as you would any other liquid soap. You can apply CHG directly to the skin and  wash gently with a scrungie or a clean washcloth.   5. Apply the CHG Soap to your body ONLY FROM THE NECK DOWN.  Do not use on open wounds or open sores. Avoid contact with your eyes, ears, mouth and genitals (private parts). Wash Face and genitals (private parts)  with your normal soap.   6. Wash thoroughly, paying special attention to the area where your surgery will be performed.  7. Thoroughly rinse your body with warm water from the neck down.  8. DO NOT shower/wash with your normal soap after using and rinsing off the CHG Soap.  9. Pat yourself dry with a CLEAN TOWEL.  10. Wear CLEAN PAJAMAS to bed the night before surgery, wear comfortable clothes the morning of surgery  11. Place CLEAN SHEETS on your bed the night of your first  shower and DO NOT SLEEP WITH PETS.   Day of Surgery:   Do not apply any deodorants/lotions.  Please wear clean clothes to the hospital/surgery center.   Remember to brush your teeth WITH YOUR REGULAR TOOTHPASTE.   Please read over the following fact sheets that you were given.

## 2020-04-01 ENCOUNTER — Encounter (HOSPITAL_COMMUNITY): Payer: Self-pay

## 2020-04-01 ENCOUNTER — Ambulatory Visit (HOSPITAL_COMMUNITY)
Admission: RE | Admit: 2020-04-01 | Discharge: 2020-04-01 | Disposition: A | Discharge status: Home / Self Care | Payer: 59 | Source: Ambulatory Visit | Attending: Orthopedic Surgery | Admitting: Orthopedic Surgery

## 2020-04-01 ENCOUNTER — Encounter (HOSPITAL_COMMUNITY)
Admission: RE | Admit: 2020-04-01 | Discharge: 2020-04-01 | Disposition: A | Discharge status: Home / Self Care | Payer: 59 | Source: Ambulatory Visit | Attending: Orthopedic Surgery | Admitting: Orthopedic Surgery

## 2020-04-01 ENCOUNTER — Other Ambulatory Visit (HOSPITAL_COMMUNITY)
Admission: RE | Admit: 2020-04-01 | Discharge: 2020-04-01 | Disposition: A | Discharge status: Home / Self Care | Payer: 59 | Source: Ambulatory Visit | Attending: Orthopedic Surgery | Admitting: Orthopedic Surgery

## 2020-04-01 ENCOUNTER — Other Ambulatory Visit: Payer: Self-pay

## 2020-04-01 DIAGNOSIS — E785 Hyperlipidemia, unspecified: Secondary | ICD-10-CM | POA: Diagnosis not present

## 2020-04-01 DIAGNOSIS — R7303 Prediabetes: Secondary | ICD-10-CM | POA: Insufficient documentation

## 2020-04-01 DIAGNOSIS — F419 Anxiety disorder, unspecified: Secondary | ICD-10-CM | POA: Diagnosis not present

## 2020-04-01 DIAGNOSIS — Z20822 Contact with and (suspected) exposure to covid-19: Secondary | ICD-10-CM | POA: Insufficient documentation

## 2020-04-01 DIAGNOSIS — M5126 Other intervertebral disc displacement, lumbar region: Secondary | ICD-10-CM | POA: Diagnosis not present

## 2020-04-01 DIAGNOSIS — F329 Major depressive disorder, single episode, unspecified: Secondary | ICD-10-CM | POA: Insufficient documentation

## 2020-04-01 DIAGNOSIS — E669 Obesity, unspecified: Secondary | ICD-10-CM | POA: Diagnosis not present

## 2020-04-01 DIAGNOSIS — I10 Essential (primary) hypertension: Secondary | ICD-10-CM | POA: Diagnosis not present

## 2020-04-01 DIAGNOSIS — Z01818 Encounter for other preprocedural examination: Secondary | ICD-10-CM

## 2020-04-01 DIAGNOSIS — Z6841 Body Mass Index (BMI) 40.0 and over, adult: Secondary | ICD-10-CM | POA: Diagnosis not present

## 2020-04-01 HISTORY — DX: Prediabetes: R73.03

## 2020-04-01 HISTORY — DX: Cardiac arrhythmia, unspecified: I49.9

## 2020-04-01 LAB — URINALYSIS, ROUTINE W REFLEX MICROSCOPIC
Bilirubin Urine: NEGATIVE
Glucose, UA: NEGATIVE mg/dL
Hgb urine dipstick: NEGATIVE
Ketones, ur: NEGATIVE mg/dL
Nitrite: NEGATIVE
Protein, ur: NEGATIVE mg/dL
Specific Gravity, Urine: 1.018 (ref 1.005–1.030)
pH: 6 (ref 5.0–8.0)

## 2020-04-01 LAB — BASIC METABOLIC PANEL
Anion gap: 8 (ref 5–15)
BUN: 14 mg/dL (ref 6–20)
CO2: 27 mmol/L (ref 22–32)
Calcium: 9.8 mg/dL (ref 8.9–10.3)
Chloride: 101 mmol/L (ref 98–111)
Creatinine, Ser: 0.67 mg/dL (ref 0.44–1.00)
GFR calc Af Amer: >60 mL/min (ref >60)
GFR calc non Af Amer: >60 mL/min (ref >60)
Glucose, Bld: 101 mg/dL — ABNORMAL HIGH (ref 70–99)
Potassium: 4.4 mmol/L (ref 3.5–5.1)
Sodium: 136 mmol/L (ref 135–145)

## 2020-04-01 LAB — CBC
HCT: 45.5 % (ref 36.0–46.0)
Hemoglobin: 15.2 g/dL — ABNORMAL HIGH (ref 12.0–15.0)
MCH: 29.3 pg (ref 26.0–34.0)
MCHC: 33.4 g/dL (ref 30.0–36.0)
MCV: 87.8 fL (ref 80.0–100.0)
Platelets: 400 10*3/uL (ref 150–400)
RBC: 5.18 MIL/uL — ABNORMAL HIGH (ref 3.87–5.11)
RDW: 12.2 % (ref 11.5–15.5)
WBC: 8.6 10*3/uL (ref 4.0–10.5)
nRBC: 0 % (ref 0.0–0.2)

## 2020-04-01 LAB — GLUCOSE, CAPILLARY: Glucose-Capillary: 112 mg/dL — ABNORMAL HIGH (ref 70–99)

## 2020-04-01 LAB — PROTIME-INR
INR: 1 (ref 0.8–1.2)
Prothrombin Time: 12.7 seconds (ref 11.4–15.2)

## 2020-04-01 LAB — SURGICAL PCR SCREEN
MRSA, PCR: NEGATIVE
Staphylococcus aureus: NEGATIVE

## 2020-04-01 LAB — SARS CORONAVIRUS 2 (TAT 6-24 HRS): SARS Coronavirus 2: NEGATIVE

## 2020-04-01 LAB — APTT: aPTT: 26 seconds (ref 24–36)

## 2020-04-01 MED ORDER — DEXTROSE 5 % IV SOLN
3.0000 g | INTRAVENOUS | Status: AC
  Administered 2020-04-02: 3 g via INTRAVENOUS
  Filled 2020-04-01: qty 3
  Filled 2020-04-01: qty 3000

## 2020-04-01 NOTE — Progress Notes (Signed)
PCP - Keith Rake, MD Cardiologist - per patient, has not sen a Cardiologist in 2 years, and has not needed to follow up. Her PCP manages her Metoprolol.  PPM/ICD - Denies  Chest x-ray - 04/01/20 EKG - 03/19/20 Stress Test - Per patient, done when she was ~ 40-34 years old due to SVTs she was experiencing at the time. It was done at [facility] in [place]. ECHO - 04/04/17 Cardiac Cath - Denies  Sleep Study - Yes, positive for OSA CPAP - per patient, cannot tolerate  Patient states she was recently diagnosed as Pre-diabetic. CBG during PAT visit was 112. Last A1c was 5.5 03/19/20 @ Dr. Ardyth Harps office.  Blood Thinner Instructions: N/A Aspirin Instructions: N/A  ERAS Protcol - N/A PRE-SURGERY Ensure or G2- N/A  COVID TEST- 04/01/20   Anesthesia review: Yes, cardiac hx; per order.  Patient denies shortness of breath, fever, cough and chest pain at PAT appointment   All instructions explained to the patient, with a verbal understanding of the material. Patient agrees to go over the instructions while at home for a better understanding. Patient also instructed to self quarantine after being tested for COVID-19. The opportunity to ask questions was provided.

## 2020-04-01 NOTE — Progress Notes (Signed)
Anesthesia Chart Review:  Case: [id number] Date/Time: 04/02/20 1145   Procedure: Left L4-5 disectomy (Left ) - 2.5 hrs   Anesthesia type: General   Pre-op diagnosis: Left L4-5 herniated disc   Location: [facility] OR ROOM 04 / [facility] OR   Surgeons: Melina Schools, MD      DISCUSSION: Patient is a 34 year old female scheduled for the above procedure.  History includes never smoker, PSVT, OSA (intolerant to CPAP), HTN, ADHD, GERD, pre-diabetes, HLD, anxiety, liver problem (transaminitis 2018 in setting of gammaherpesviral mononucleosis, suggestion of fatty infiltration on 02/03/17 liver US, normal LFT 10/29/19).    She has medical clearance for surgery by Isaac Bliss, Rayford Halsted, MD following 03/19/20 evaluation. She wrote, "She is class I risk for surgery which confers 3.9% of 30-day risk of death, MI, cardiac arrest.  Okay to proceed to surgery with further work-up."  04/01/2020 presurgical COVID-19 test in process.  Anesthesia team to evaluate on the day of surgery.   VS: BP (!) 140/99   Pulse 98   Temp 36.9 C (Oral)   Resp 20   Ht 5\' 8"  (1.727 m)   Wt 132.1 kg   LMP 03/01/2020   SpO2 100%   BMI 44.29 kg/m   150/108-->140/99. Takes Toprol Q PM. 8/10 pain, had to stand for PAT visit. BP 124/84 on 03/19/20.     PROVIDERS: Isaac Bliss, Rayford Halsted, MD is PCP  - Reported that she is not currently followed by cardiology. She has been evaluated by Glenetta Hew, MD, last 07/02/17 with overall unremarkable echo. In regards to PSVT, symptoms manages with oral medication but would consider referral to EP should she develop worsening symptoms or true syncope. [As of 04/01/20, she reported PSVT remains controlled on Toprol.]   LABS: Labs reviewed: Acceptable for surgery.  A1c 5.5% on 03/19/20. LFTs WNL on 10/29/19.  (all labs ordered are listed, but only abnormal results are displayed)  Labs Reviewed  BASIC METABOLIC PANEL - Abnormal; Notable for the following components:      Result Value   Glucose,  Bld 101 (*)    All other components within normal limits  CBC - Abnormal; Notable for the following components:   RBC 5.18 (*)    Hemoglobin 15.2 (*)    All other components within normal limits  URINALYSIS, ROUTINE W REFLEX MICROSCOPIC - Abnormal; Notable for the following components:   APPearance HAZY (*)    Leukocytes,Ua SMALL (*)    Bacteria, UA RARE (*)    All other components within normal limits  SURGICAL PCR SCREEN  APTT  PROTIME-INR     IMAGES: CXR 04/01/20:  FINDINGS: The heart size and mediastinal contours are within normal limits. Both lungs are clear. The visualized skeletal structures are unremarkable. IMPRESSION: No active cardiopulmonary disease.   EKG: 03/19/20: Sinus  Rhythm  WITHIN NORMAL LIMITS   CV: Echo 04/04/17: Study Conclusions  - Left ventricle: The cavity size was normal. Wall thickness was  increased in a pattern of mild LVH. Systolic function was normal.  The estimated ejection fraction was in the range of 55% to 60%.  Wall motion was normal; there were no regional wall motion  abnormalities. Doppler parameters are consistent with abnormal  left ventricular relaxation (grade 1 diastolic dysfunction).  - Aortic valve: There was no stenosis.  - Mitral valve: There was no significant regurgitation.  - Right ventricle: The cavity size was normal. Systolic function  was normal.  - Pulmonary arteries: No complete TR doppler jet so unable  to  estimate PA systolic pressure.  - Systemic veins: IVC not visualized.  Impressions:  - Normal LV size with mild LV hypertrophy. EF 55-60%. Normal RV  size and systolic function. No significant valvular  abnormalities.   She believes she had some type of stress test [facility] in [place], [place]) when she was around age 80-11 when she had initial work-up for PSVT.    Past Medical History:  Diagnosis Date  . ADHD   . Anal fissure   . Anxiety   . Back pain   . Depression   .  Dysrhythmia    SVTs  . GERD (gastroesophageal reflux disease)   . High blood pressure   . History of PSVT (paroxysmal supraventricular tachycardia)    managed with Beta Blockers & Vagal Maneuvers.  Marland Kitchen HTN (hypertension)   . Hyperlipidemia   . 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 Liver problem   . Mononucleosis 02/2017  . Obesity   . Pneumonia 2014   walking PNA  . Pre-diabetes    Last A1C was 5.5 03/19/20  . Sleep apnea   . SVT (supraventricular tachycardia) (HCC)   . Vitamin D deficiency     Past Surgical History:  Procedure Laterality Date  . COLPOSCOPY  10/2017  . TRANSTHORACIC ECHOCARDIOGRAM  03/2017   Normal EF 55-60%. GR 1 DD. Normal Valves - no MVP  . WISDOM TOOTH EXTRACTION      MEDICATIONS: . acetaminophen (TYLENOL) 325 MG tablet  . buPROPion (WELLBUTRIN SR) 150 MG 12 hr tablet  . cholecalciferol (VITAMIN D) 1000 UNITS tablet  . citalopram (CELEXA) 20 MG tablet  . gabapentin (NEURONTIN) 300 MG capsule  . ibuprofen (ADVIL) 200 MG tablet  . metoprolol succinate (TOPROL-XL) 50 MG 24 hr tablet  . Multiple Vitamins-Minerals (HAIR/SKIN/NAILS/BIOTIN PO)  . oxyCODONE-acetaminophen (PERCOCET) 10-325 MG tablet  . simvastatin (ZOCOR) 40 MG tablet   No current facility-administered medications for this encounter.   Melene Muller ON 04/02/2020] ceFAZolin (ANCEF) 3 g in dextrose 5 % 50 mL IVPB    Shonna Chock, PA-C Surgical Short Stay/Anesthesiology [facility] Phone [phone] [facility] Phone [phone] 04/01/2020 5:18 PM

## 2020-04-01 NOTE — Anesthesia Preprocedure Evaluation (Addendum)
Anesthesia Evaluation  Patient identified by MRN, date of birth, ID band Patient awake    Reviewed: Allergy & Precautions, NPO status , Patient's Chart, lab work & pertinent test results, reviewed documented beta blocker date and time   Airway Mallampati: I       Dental no notable dental hx. (+) Teeth Intact   Pulmonary    Pulmonary exam normal breath sounds clear to auscultation       Cardiovascular hypertension, Pt. on home beta blockers Normal cardiovascular exam+ dysrhythmias Supra Ventricular Tachycardia  Rhythm:Regular Rate:Normal     Neuro/Psych PSYCHIATRIC DISORDERS Anxiety Depression    GI/Hepatic Neg liver ROS,   Endo/Other  negative endocrine ROS  Renal/GU negative Renal ROS  negative genitourinary   Musculoskeletal negative musculoskeletal ROS (+)   Abdominal Normal abdominal exam  (+)   Peds  Hematology negative hematology ROS (+)   Anesthesia Other Findings   Reproductive/Obstetrics                            Anesthesia Physical Anesthesia Plan  ASA: II  Anesthesia Plan: General   Post-op Pain Management:    Induction: Intravenous  PONV Risk Score and Plan: 3 and Ondansetron, Dexamethasone and Midazolam  Airway Management Planned: Oral ETT  Additional Equipment: None  Intra-op Plan:   Post-operative Plan:   Informed Consent: I have reviewed the patients History and Physical, chart, labs and discussed the procedure including the risks, benefits and alternatives for the proposed anesthesia with the patient or authorized representative who has indicated his/her understanding and acceptance.     Dental advisory given  Plan Discussed with: CRNA  Anesthesia Plan Comments: (PAT note written 04/01/2020 by Shonna Chock, PA-C. )       Anesthesia Quick Evaluation

## 2020-04-01 NOTE — Progress Notes (Signed)
Abnormal UA called in to Dr. Shon Baton' office.

## 2020-04-02 ENCOUNTER — Ambulatory Visit (HOSPITAL_COMMUNITY): Payer: 59 | Admitting: Certified Registered"

## 2020-04-02 ENCOUNTER — Observation Stay (HOSPITAL_COMMUNITY)
Admission: RE | Admit: 2020-04-02 | Discharge: 2020-04-03 | Disposition: A | Discharge status: Home / Self Care | Payer: 59 | Attending: Orthopedic Surgery | Admitting: Orthopedic Surgery

## 2020-04-02 ENCOUNTER — Ambulatory Visit (HOSPITAL_COMMUNITY): Payer: 59 | Admitting: Vascular Surgery

## 2020-04-02 ENCOUNTER — Ambulatory Visit (HOSPITAL_COMMUNITY)
Admission: RE | Disposition: A | Discharge status: Home / Self Care | Payer: Self-pay | Source: Home / Self Care | Attending: Orthopedic Surgery

## 2020-04-02 ENCOUNTER — Encounter (HOSPITAL_COMMUNITY): Payer: Self-pay | Admitting: Orthopedic Surgery

## 2020-04-02 ENCOUNTER — Other Ambulatory Visit: Payer: Self-pay

## 2020-04-02 ENCOUNTER — Ambulatory Visit (HOSPITAL_COMMUNITY): Payer: 59

## 2020-04-02 DIAGNOSIS — E785 Hyperlipidemia, unspecified: Secondary | ICD-10-CM | POA: Insufficient documentation

## 2020-04-02 DIAGNOSIS — Z6841 Body Mass Index (BMI) 40.0 and over, adult: Secondary | ICD-10-CM | POA: Diagnosis not present

## 2020-04-02 DIAGNOSIS — F329 Major depressive disorder, single episode, unspecified: Secondary | ICD-10-CM | POA: Diagnosis not present

## 2020-04-02 DIAGNOSIS — F411 Generalized anxiety disorder: Secondary | ICD-10-CM | POA: Insufficient documentation

## 2020-04-02 DIAGNOSIS — M5126 Other intervertebral disc displacement, lumbar region: Secondary | ICD-10-CM | POA: Diagnosis present

## 2020-04-02 DIAGNOSIS — G4730 Sleep apnea, unspecified: Secondary | ICD-10-CM | POA: Diagnosis not present

## 2020-04-02 DIAGNOSIS — Z79899 Other long term (current) drug therapy: Secondary | ICD-10-CM | POA: Insufficient documentation

## 2020-04-02 DIAGNOSIS — I471 Supraventricular tachycardia: Secondary | ICD-10-CM | POA: Insufficient documentation

## 2020-04-02 DIAGNOSIS — I10 Essential (primary) hypertension: Secondary | ICD-10-CM | POA: Diagnosis not present

## 2020-04-02 DIAGNOSIS — M5116 Intervertebral disc disorders with radiculopathy, lumbar region: Secondary | ICD-10-CM | POA: Diagnosis present

## 2020-04-02 DIAGNOSIS — E559 Vitamin D deficiency, unspecified: Secondary | ICD-10-CM | POA: Diagnosis not present

## 2020-04-02 DIAGNOSIS — Z419 Encounter for procedure for purposes other than remedying health state, unspecified: Secondary | ICD-10-CM

## 2020-04-02 HISTORY — PX: LUMBAR LAMINECTOMY/DECOMPRESSION MICRODISCECTOMY: SHX5026

## 2020-04-02 LAB — GLUCOSE, CAPILLARY
Glucose-Capillary: 76 mg/dL (ref 70–99)
Glucose-Capillary: 75 mg/dL (ref 70–99)

## 2020-04-02 LAB — POCT PREGNANCY, URINE: Preg Test, Ur: NEGATIVE

## 2020-04-02 SURGERY — LUMBAR LAMINECTOMY/DECOMPRESSION MICRODISCECTOMY 1 LEVEL
Anesthesia: General | Site: Spine Lumbar | Laterality: Left

## 2020-04-02 MED ORDER — PHENOL 1.4 % MT LIQD: 1.0000 | OROMUCOSAL | Status: DC | PRN

## 2020-04-02 MED ORDER — PHENYLEPHRINE 40 MCG/ML (10ML) SYRINGE FOR IV PUSH (FOR BLOOD PRESSURE SUPPORT)
PREFILLED_SYRINGE | INTRAVENOUS | Status: AC
  Filled 2020-04-02: qty 20

## 2020-04-02 MED ORDER — SODIUM CHLORIDE 0.9% FLUSH: 3.0000 mL | Freq: Two times a day (BID) | INTRAVENOUS | Status: DC

## 2020-04-02 MED ORDER — THROMBIN (RECOMBINANT) 20000 UNITS EX SOLR
CUTANEOUS | Status: AC
  Filled 2020-04-02: qty 20000

## 2020-04-02 MED ORDER — GABAPENTIN 300 MG PO CAPS
300.0000 mg | ORAL_CAPSULE | Freq: Three times a day (TID) | ORAL | Status: DC
  Administered 2020-04-02 (×2): 300 mg via ORAL
  Filled 2020-04-02 (×2): qty 1

## 2020-04-02 MED ORDER — LIDOCAINE 2% (20 MG/ML) 5 ML SYRINGE
INTRAMUSCULAR | Status: AC
  Filled 2020-04-02: qty 5

## 2020-04-02 MED ORDER — PHENYLEPHRINE 40 MCG/ML (10ML) SYRINGE FOR IV PUSH (FOR BLOOD PRESSURE SUPPORT)
PREFILLED_SYRINGE | INTRAVENOUS | Status: DC | PRN
  Administered 2020-04-02: 120 ug via INTRAVENOUS
  Administered 2020-04-02 (×2): 200 ug via INTRAVENOUS

## 2020-04-02 MED ORDER — METHYLPREDNISOLONE ACETATE 40 MG/ML IJ SUSP
INTRAMUSCULAR | Status: AC
  Filled 2020-04-02: qty 2

## 2020-04-02 MED ORDER — VANCOMYCIN HCL 1000 MG IV SOLR
INTRAVENOUS | Status: AC
  Filled 2020-04-02: qty 1000

## 2020-04-02 MED ORDER — EPHEDRINE SULFATE-NACL 50-0.9 MG/10ML-% IV SOSY
PREFILLED_SYRINGE | INTRAVENOUS | Status: DC | PRN
  Administered 2020-04-02: 10 mg via INTRAVENOUS

## 2020-04-02 MED ORDER — OXYCODONE HCL 5 MG PO TABS
10.0000 mg | ORAL_TABLET | ORAL | Status: DC | PRN
  Administered 2020-04-02 – 2020-04-03 (×5): 10 mg via ORAL
  Filled 2020-04-02 (×5): qty 2

## 2020-04-02 MED ORDER — OXYCODONE-ACETAMINOPHEN 10-325 MG PO TABS: 1.0000 | ORAL_TABLET | Freq: Four times a day (QID) | ORAL | 0 refills | Status: AC | PRN

## 2020-04-02 MED ORDER — HYDROMORPHONE HCL 1 MG/ML IJ SOLN
0.5000 mg | INTRAMUSCULAR | Status: DC | PRN
  Administered 2020-04-02: 1 mg via INTRAVENOUS
  Filled 2020-04-02: qty 1

## 2020-04-02 MED ORDER — ONDANSETRON HCL 4 MG/2ML IJ SOLN: 4.0000 mg | Freq: Four times a day (QID) | INTRAMUSCULAR | Status: DC | PRN

## 2020-04-02 MED ORDER — FENTANYL CITRATE (PF) 100 MCG/2ML IJ SOLN
INTRAMUSCULAR | Status: DC | PRN
  Administered 2020-04-02 (×2): 100 ug via INTRAVENOUS
  Administered 2020-04-02: 50 ug via INTRAVENOUS

## 2020-04-02 MED ORDER — DEXAMETHASONE SODIUM PHOSPHATE 10 MG/ML IJ SOLN
INTRAMUSCULAR | Status: AC
  Filled 2020-04-02: qty 1

## 2020-04-02 MED ORDER — ACETAMINOPHEN 325 MG PO TABS
650.0000 mg | ORAL_TABLET | ORAL | Status: DC | PRN
  Administered 2020-04-02 – 2020-04-03 (×2): 650 mg via ORAL
  Filled 2020-04-02 (×2): qty 2

## 2020-04-02 MED ORDER — ACETAMINOPHEN 10 MG/ML IV SOLN
INTRAVENOUS | Status: DC | PRN
  Administered 2020-04-02: 1000 mg via INTRAVENOUS

## 2020-04-02 MED ORDER — BUPIVACAINE HCL (PF) 0.25 % IJ SOLN
INTRAMUSCULAR | Status: AC
  Filled 2020-04-02: qty 30

## 2020-04-02 MED ORDER — DOCUSATE SODIUM 100 MG PO CAPS
100.0000 mg | ORAL_CAPSULE | Freq: Two times a day (BID) | ORAL | Status: DC
  Administered 2020-04-02: 100 mg via ORAL
  Filled 2020-04-02: qty 1

## 2020-04-02 MED ORDER — CITALOPRAM HYDROBROMIDE 20 MG PO TABS: 20.0000 mg | ORAL_TABLET | Freq: Every day | ORAL | Status: DC

## 2020-04-02 MED ORDER — PROPOFOL 10 MG/ML IV BOLUS
INTRAVENOUS | Status: DC | PRN
  Administered 2020-04-02: 200 mg via INTRAVENOUS

## 2020-04-02 MED ORDER — ONDANSETRON HCL 4 MG PO TABS: 4.0000 mg | ORAL_TABLET | Freq: Four times a day (QID) | ORAL | Status: DC | PRN

## 2020-04-02 MED ORDER — ACETAMINOPHEN 650 MG RE SUPP: 650.0000 mg | RECTAL | Status: DC | PRN

## 2020-04-02 MED ORDER — MORPHINE SULFATE (PF) 2 MG/ML IV SOLN
2.0000 mg | INTRAVENOUS | Status: DC | PRN
  Administered 2020-04-02: 2 mg via INTRAVENOUS
  Filled 2020-04-02: qty 1

## 2020-04-02 MED ORDER — CEFAZOLIN SODIUM-DEXTROSE 1-4 GM/50ML-% IV SOLN
1.0000 g | Freq: Three times a day (TID) | INTRAVENOUS | Status: AC
  Administered 2020-04-02 – 2020-04-03 (×2): 1 g via INTRAVENOUS
  Filled 2020-04-02 (×2): qty 50

## 2020-04-02 MED ORDER — ONDANSETRON HCL 4 MG/2ML IJ SOLN
INTRAMUSCULAR | Status: DC | PRN
  Administered 2020-04-02: 4 mg via INTRAVENOUS

## 2020-04-02 MED ORDER — ROCURONIUM BROMIDE 10 MG/ML (PF) SYRINGE
PREFILLED_SYRINGE | INTRAVENOUS | Status: DC | PRN
  Administered 2020-04-02: 40 mg via INTRAVENOUS
  Administered 2020-04-02: 20 mg via INTRAVENOUS
  Administered 2020-04-02: 70 mg via INTRAVENOUS
  Administered 2020-04-02: 30 mg via INTRAVENOUS

## 2020-04-02 MED ORDER — METHOCARBAMOL 500 MG PO TABS: 500.0000 mg | ORAL_TABLET | Freq: Three times a day (TID) | ORAL | 0 refills | Status: AC | PRN

## 2020-04-02 MED ORDER — BUPIVACAINE HCL (PF) 0.25 % IJ SOLN
INTRAMUSCULAR | Status: DC | PRN
  Administered 2020-04-02: 20 mL

## 2020-04-02 MED ORDER — 0.9 % SODIUM CHLORIDE (POUR BTL) OPTIME
TOPICAL | Status: DC | PRN
  Administered 2020-04-02: 1000 mL

## 2020-04-02 MED ORDER — LIDOCAINE 2% (20 MG/ML) 5 ML SYRINGE
INTRAMUSCULAR | Status: DC | PRN
  Administered 2020-04-02: 100 mg via INTRAVENOUS

## 2020-04-02 MED ORDER — METHOCARBAMOL 500 MG PO TABS
500.0000 mg | ORAL_TABLET | Freq: Four times a day (QID) | ORAL | Status: DC | PRN
  Administered 2020-04-02 – 2020-04-03 (×3): 500 mg via ORAL
  Filled 2020-04-02 (×3): qty 1

## 2020-04-02 MED ORDER — POLYETHYLENE GLYCOL 3350 17 G PO PACK: 17.0000 g | PACK | Freq: Every day | ORAL | Status: DC | PRN

## 2020-04-02 MED ORDER — EPHEDRINE 5 MG/ML INJ
INTRAVENOUS | Status: AC
  Filled 2020-04-02: qty 10

## 2020-04-02 MED ORDER — MENTHOL 3 MG MT LOZG: 1.0000 | LOZENGE | OROMUCOSAL | Status: DC | PRN

## 2020-04-02 MED ORDER — ROCURONIUM BROMIDE 10 MG/ML (PF) SYRINGE
PREFILLED_SYRINGE | INTRAVENOUS | Status: AC
  Filled 2020-04-02: qty 10

## 2020-04-02 MED ORDER — MEPERIDINE HCL 25 MG/ML IJ SOLN: 6.2500 mg | INTRAMUSCULAR | Status: DC | PRN

## 2020-04-02 MED ORDER — KETOROLAC TROMETHAMINE 30 MG/ML IJ SOLN
INTRAMUSCULAR | Status: AC
  Filled 2020-04-02: qty 1

## 2020-04-02 MED ORDER — KETOROLAC TROMETHAMINE 30 MG/ML IJ SOLN
30.0000 mg | Freq: Once | INTRAMUSCULAR | Status: AC | PRN
  Administered 2020-04-02: 30 mg via INTRAVENOUS

## 2020-04-02 MED ORDER — EPINEPHRINE PF 1 MG/ML IJ SOLN
INTRAMUSCULAR | Status: DC | PRN
  Administered 2020-04-02: .15 mL

## 2020-04-02 MED ORDER — METHYLPREDNISOLONE ACETATE 40 MG/ML INJ SUSP (RADIOLOG
INTRAMUSCULAR | Status: DC | PRN
  Administered 2020-04-02: 120 mg

## 2020-04-02 MED ORDER — DEXAMETHASONE SODIUM PHOSPHATE 10 MG/ML IJ SOLN
INTRAMUSCULAR | Status: DC | PRN
  Administered 2020-04-02: 10 mg via INTRAVENOUS

## 2020-04-02 MED ORDER — PHENYLEPHRINE HCL-NACL 10-0.9 MG/250ML-% IV SOLN
INTRAVENOUS | Status: DC | PRN
  Administered 2020-04-02: 50 ug/min via INTRAVENOUS

## 2020-04-02 MED ORDER — TRANEXAMIC ACID-NACL 1000-0.7 MG/100ML-% IV SOLN
INTRAVENOUS | Status: AC
  Filled 2020-04-02: qty 100

## 2020-04-02 MED ORDER — EPINEPHRINE PF 1 MG/ML IJ SOLN
INTRAMUSCULAR | Status: AC
  Filled 2020-04-02: qty 1

## 2020-04-02 MED ORDER — HYDROMORPHONE HCL 1 MG/ML IJ SOLN
INTRAMUSCULAR | Status: AC
  Filled 2020-04-02: qty 1

## 2020-04-02 MED ORDER — SODIUM CHLORIDE 0.9% FLUSH: 3.0000 mL | INTRAVENOUS | Status: DC | PRN

## 2020-04-02 MED ORDER — MIDAZOLAM HCL 2 MG/2ML IJ SOLN
INTRAMUSCULAR | Status: DC | PRN
  Administered 2020-04-02: 2 mg via INTRAVENOUS

## 2020-04-02 MED ORDER — TRANEXAMIC ACID-NACL 1000-0.7 MG/100ML-% IV SOLN
INTRAVENOUS | Status: DC | PRN
  Administered 2020-04-02: 250 mg via INTRAVENOUS

## 2020-04-02 MED ORDER — MIDAZOLAM HCL 2 MG/2ML IJ SOLN
INTRAMUSCULAR | Status: AC
  Filled 2020-04-02: qty 2

## 2020-04-02 MED ORDER — LACTATED RINGERS IV SOLN: INTRAVENOUS | Status: DC

## 2020-04-02 MED ORDER — PROMETHAZINE HCL 25 MG/ML IJ SOLN
INTRAMUSCULAR | Status: AC
  Filled 2020-04-02: qty 1

## 2020-04-02 MED ORDER — SUGAMMADEX SODIUM 200 MG/2ML IV SOLN
INTRAVENOUS | Status: DC | PRN
  Administered 2020-04-02: 260 mg via INTRAVENOUS

## 2020-04-02 MED ORDER — FENTANYL CITRATE (PF) 250 MCG/5ML IJ SOLN
INTRAMUSCULAR | Status: AC
  Filled 2020-04-02: qty 5

## 2020-04-02 MED ORDER — METHOCARBAMOL 1000 MG/10ML IJ SOLN
500.0000 mg | Freq: Four times a day (QID) | INTRAVENOUS | Status: DC | PRN
  Filled 2020-04-02: qty 5

## 2020-04-02 MED ORDER — PROPOFOL 10 MG/ML IV BOLUS
INTRAVENOUS | Status: AC
  Filled 2020-04-02: qty 20

## 2020-04-02 MED ORDER — SUGAMMADEX SODIUM 500 MG/5ML IV SOLN
INTRAVENOUS | Status: AC
  Filled 2020-04-02: qty 5

## 2020-04-02 MED ORDER — THROMBIN 20000 UNITS EX SOLR: CUTANEOUS | Status: DC | PRN

## 2020-04-02 MED ORDER — ONDANSETRON HCL 4 MG PO TABS: 4.0000 mg | ORAL_TABLET | Freq: Three times a day (TID) | ORAL | 0 refills | Status: AC | PRN

## 2020-04-02 MED ORDER — ACETAMINOPHEN 10 MG/ML IV SOLN
INTRAVENOUS | Status: AC
  Filled 2020-04-02: qty 100

## 2020-04-02 MED ORDER — VANCOMYCIN HCL 1000 MG IV SOLR
INTRAVENOUS | Status: DC | PRN
  Administered 2020-04-02: 1000 mg

## 2020-04-02 MED ORDER — PROMETHAZINE HCL 25 MG/ML IJ SOLN: 6.2500 mg | INTRAMUSCULAR | Status: DC | PRN

## 2020-04-02 MED ORDER — METOPROLOL SUCCINATE ER 50 MG PO TB24
50.0000 mg | ORAL_TABLET | Freq: Every day | ORAL | Status: DC
  Administered 2020-04-02: 50 mg via ORAL
  Filled 2020-04-02: qty 1

## 2020-04-02 MED ORDER — ONDANSETRON HCL 4 MG/2ML IJ SOLN
INTRAMUSCULAR | Status: AC
  Filled 2020-04-02: qty 2

## 2020-04-02 MED ORDER — OXYCODONE HCL 5 MG PO TABS: 5.0000 mg | ORAL_TABLET | ORAL | Status: DC | PRN

## 2020-04-02 MED ORDER — HYDROMORPHONE HCL 1 MG/ML IJ SOLN
0.2500 mg | INTRAMUSCULAR | Status: DC | PRN
  Administered 2020-04-02 (×3): 0.5 mg via INTRAVENOUS

## 2020-04-02 MED ORDER — BUPROPION HCL ER (SR) 150 MG PO TB12
150.0000 mg | ORAL_TABLET | Freq: Two times a day (BID) | ORAL | Status: DC
  Administered 2020-04-02: 150 mg via ORAL
  Filled 2020-04-02: qty 1

## 2020-04-02 SURGICAL SUPPLY — 56 items
BNDG GAUZE ELAST 4 BULKY (GAUZE/BANDAGES/DRESSINGS) ×2 IMPLANT
CANISTER SUCT 3000ML PPV (MISCELLANEOUS) ×2 IMPLANT
CLSR STERI-STRIP ANTIMIC 1/2X4 (GAUZE/BANDAGES/DRESSINGS) ×2 IMPLANT
CORD BIPOLAR FORCEPS 12FT (ELECTRODE) ×2 IMPLANT
COVER SURGICAL LIGHT HANDLE (MISCELLANEOUS) ×2 IMPLANT
COVER WAND RF STERILE (DRAPES) ×2 IMPLANT
DRAIN CHANNEL 15F RND FF W/TCR (WOUND CARE) IMPLANT
DRAPE POUCH INSTRU U-SHP 10X18 (DRAPES) ×2 IMPLANT
DRAPE SURG 17X23 STRL (DRAPES) ×2 IMPLANT
DRAPE U-SHAPE 47X51 STRL (DRAPES) ×2 IMPLANT
DRSG OPSITE POSTOP 3X4 (GAUZE/BANDAGES/DRESSINGS) ×2 IMPLANT
DRSG OPSITE POSTOP 4X6 (GAUZE/BANDAGES/DRESSINGS) ×2 IMPLANT
DURAPREP 26ML APPLICATOR (WOUND CARE) ×2 IMPLANT
ELECT BLADE 4.0 EZ CLEAN MEGAD (MISCELLANEOUS)
ELECT CAUTERY BLADE 6.4 (BLADE) ×2 IMPLANT
ELECT PENCIL ROCKER SW 15FT (MISCELLANEOUS) ×2 IMPLANT
ELECT REM PT RETURN 9FT ADLT (ELECTROSURGICAL) ×2
ELECTRODE BLDE 4.0 EZ CLN MEGD (MISCELLANEOUS) IMPLANT
ELECTRODE REM PT RTRN 9FT ADLT (ELECTROSURGICAL) ×1 IMPLANT
EVACUATOR SILICONE 100CC (DRAIN) IMPLANT
GLOVE BIO SURGEON STRL SZ 6.5 (GLOVE) ×2 IMPLANT
GLOVE BIOGEL PI IND STRL 6.5 (GLOVE) ×1 IMPLANT
GLOVE BIOGEL PI IND STRL 8.5 (GLOVE) ×1 IMPLANT
GLOVE BIOGEL PI INDICATOR 6.5 (GLOVE) ×1
GLOVE BIOGEL PI INDICATOR 8.5 (GLOVE) ×1
GLOVE SS BIOGEL STRL SZ 8.5 (GLOVE) ×1 IMPLANT
GLOVE SUPERSENSE BIOGEL SZ 8.5 (GLOVE) ×1
GOWN STRL REUS W/ TWL LRG LVL3 (GOWN DISPOSABLE) ×2 IMPLANT
GOWN STRL REUS W/TWL 2XL LVL3 (GOWN DISPOSABLE) ×2 IMPLANT
GOWN STRL REUS W/TWL LRG LVL3 (GOWN DISPOSABLE) ×2
KIT BASIN OR (CUSTOM PROCEDURE TRAY) ×2 IMPLANT
KIT TURNOVER KIT B (KITS) ×2 IMPLANT
NEEDLE 22X1 1/2 (OR ONLY) (NEEDLE) ×2 IMPLANT
NEEDLE SPNL 18GX3.5 QUINCKE PK (NEEDLE) ×4 IMPLANT
NS IRRIG 1000ML POUR BTL (IV SOLUTION) ×2 IMPLANT
PACK LAMINECTOMY ORTHO (CUSTOM PROCEDURE TRAY) ×2 IMPLANT
PACK UNIVERSAL I (CUSTOM PROCEDURE TRAY) ×2 IMPLANT
PAD ARMBOARD 7.5X6 YLW CONV (MISCELLANEOUS) ×4 IMPLANT
PATTIES SURGICAL .5 X.5 (GAUZE/BANDAGES/DRESSINGS) ×2 IMPLANT
PATTIES SURGICAL .5 X1 (DISPOSABLE) ×2 IMPLANT
SPONGE SURGIFOAM ABS GEL 100 (HEMOSTASIS) ×2 IMPLANT
SURGIFLO W/THROMBIN 8M KIT (HEMOSTASIS) IMPLANT
SUT BONE WAX W31G (SUTURE) ×2 IMPLANT
SUT MON AB 3-0 SH 27 (SUTURE) ×1
SUT MON AB 3-0 SH27 (SUTURE) ×1 IMPLANT
SUT VIC AB 0 CT1 27 (SUTURE)
SUT VIC AB 0 CT1 27XBRD ANBCTR (SUTURE) IMPLANT
SUT VIC AB 1 CT1 18XCR BRD 8 (SUTURE) ×1 IMPLANT
SUT VIC AB 1 CT1 8-18 (SUTURE) ×1
SUT VIC AB 2-0 CT1 18 (SUTURE) ×2 IMPLANT
SYR BULB IRRIGATION 50ML (SYRINGE) ×2 IMPLANT
SYR CONTROL 10ML LL (SYRINGE) ×2 IMPLANT
TOWEL GREEN STERILE (TOWEL DISPOSABLE) ×2 IMPLANT
TOWEL GREEN STERILE FF (TOWEL DISPOSABLE) ×2 IMPLANT
WATER STERILE IRR 1000ML POUR (IV SOLUTION) ×2 IMPLANT
YANKAUER SUCT BULB TIP NO VENT (SUCTIONS) IMPLANT

## 2020-04-02 NOTE — H&P (Signed)
Addendum H&P  There is been no change in her clinical exam since her last office visit of 03/25/2020.  Patient continues to have back buttock and radicular left leg pain.  I have gone over the surgical procedure which was L4-5 discectomy.  All of her questions were encouraged and addressed.  We reviewed the risks and benefits.  Plan on moving forward with the L4-5 left-sided decompression/discectomy.

## 2020-04-02 NOTE — Op Note (Signed)
Operative report  Preoperative diagnosis: Large left L4-5 disc herniation with left L5 radiculopathy  Postoperative diagnosis: Same  Operative procedure: Lumbar laminotomy for discectomy left L4-5.  CPT code: 77824  First Assistant: Glynis Smiles, PA  Complications: None  Indications: Rose Cortez is a very pleasant 34 year old woman who is been having severe back pain and radicular left leg pain for the last 6 months.  Imaging studies ultimately revealed a large posterior lateral to the left disc herniation at L for 5 causing marked compression of the L5 nerve root.  In addition the patient has degenerative lumbar disc disease at L5-S1.  Patient's primary pain generator was noted a large disc herniation.  After discussions of surgical and nonsurgical treatment she elected to move forward with surgery.  All appropriate risks benefits and alternatives were discussed with the patient and consent was obtained.  Operative report: Patient was brought to the operating room placed upon the operating room table.  After successful induction of general anesthesia and endotracheal ovation teds SCDs were applied she was turned prone onto the Wilson frame.  All bony prominences were well-padded and the back was prepped and draped in a standard fashion.  Timeout was taken to confirm patient procedure and all other important data.  2 needles were then placed into the back and x-ray was taken for localization of the incision.  I marked out the incision site and then made a midline incision.  Sharp dissection was carried out through subcutaneous adipose tissue to the deep fascia.  The deep fascia was sharply incised and I stripped the paraspinal muscles to expose the L5, L4 and a portion of the L3 spinous process.  A Penfield 4 was then placed underneath the L4 lamina and an x-ray was taken to confirm that it was at the appropriate level.  Once the L4 lamina was properly identified a laminotomy was performed with a 3 mm  Kerrison rongeur.  Using a Penfield 4 I gently dissected through the ligamentum flavum and then resected with a 2 and 3 mm Kerrison punch.  With the ligamentum flavum resected I could now visualize the thecal sac.  The L5 nerve root came into view and was noted to be significantly dorsally displaced and under tension.  I began to gently dissect into the lateral recess and used my 2 mm Kerrison rongeur to perform a medial facetectomy.  Once this was done I could then see the posterior annulus and begin dissecting the nerve root and thecal sac medially.  At this point an annulotomy was performed with a 15 blade scalpel and then I used a ball-tipped nerve hook to mobilize the disc fragment.  Multiple large fragments of disc material was removed.  After final evaluation once I remove the last very large fragment of disc the L5 nerve root was now freely mobile.  There was no longer under compression and I can easily pass my [facility] along the route of the L5 nerve root into the foramen.  The lateral recess had been decompressed and I can easily pass the [facility] elevator circumferentially at the level of the disc space and superiorly into the lateral recess above.  The thecal sac and V nerve root were now thoroughly decompressed.  After final sleep I was confident that I had removed all the fragments of disc material.  In addition the amount of disc material removed was consistent with what was seen on the preoperative MRI.  At this point hemostasis was obtained using bipolar cautery and  FloSeal.  Irrigation was then done copiously.  I then placed about 20 mg (1/2 cc) of Depo-Medrol over the nerve to aid in its postoperative analgesia.  A large thrombin-soaked Gelfoam patty was then placed over the laminotomy site.  Because of her elevated BMI and a deep wound I was concerned about increased risk for postoperative SSI.  After copious irrigation I then placed vancomycin into the deep layer and then closed the  fascia with interrupted #1 Vicryl sutures.  I then placed vancomycin powder in the superficial area and then closed in a layered fashion with interrupted 0 Vicryl suture, 2-0 Vicryl suture, and 3-0 Monocryl.  Steri-Strips and a dry dressing were applied and the patient was ultimately extubated and transferred to the PACU without incident.  The end of the case all needle sponge counts were correct.

## 2020-04-02 NOTE — OR Nursing (Signed)
PER PROTOCOL DR. Bradly Chris CALLED AT 13:48 ON 04/02/2020 AND CONFIRMED PROBE WAS AT L4-L5 DISC SPACE.

## 2020-04-02 NOTE — Brief Op Note (Signed)
04/02/2020  2:59 PM  PATIENT:  Rose Cortez  34 y.o. female  PRE-OPERATIVE DIAGNOSIS:  Left L4-5 herniated disc  POST-OPERATIVE DIAGNOSIS:  Left L4-5 herniated disc  PROCEDURE:  Procedure(s) with comments: Left Lumbar four-Lumbar five disectomy (Left) - 2.5 hrs  SURGEON:  Surgeon(s) and Role:    Venita Lick, MD - Primary  PHYSICIAN ASSISTANT:   ASSISTANTS: amanda ward, pa   ANESTHESIA:   general  EBL:  150 mL   BLOOD ADMINISTERED:none  DRAINS: none   LOCAL MEDICATIONS USED:  MARCAINE    and OTHER depomedrol  SPECIMEN:  No Specimen  DISPOSITION OF SPECIMEN:  N/A  COUNTS:  YES  TOURNIQUET:  * No tourniquets in log *  DICTATION: .Dragon Dictation  PLAN OF CARE: Admit for overnight observation  PATIENT DISPOSITION:  PACU - hemodynamically stable.

## 2020-04-02 NOTE — Transfer of Care (Signed)
Immediate Anesthesia Transfer of Care Note  Patient: Rose Cortez  Procedure(s) Performed: Left Lumbar four-Lumbar five disectomy (Left Spine Lumbar)  Patient Location: PACU  Anesthesia Type:General  Level of Consciousness: awake and oriented  Airway & Oxygen Therapy: Patient Spontanous Breathing  Post-op Assessment: Report given to RN and Patient moving all extremities X 4  Post vital signs: Reviewed and stable  Last Vitals:  Vitals Value Taken Time  BP 138/89 04/02/20 1523  Temp    Pulse 99 04/02/20 1524  Resp 22 04/02/20 1524  SpO2 95 % 04/02/20 1524  Vitals shown include unvalidated device data.  Last Pain:  Vitals:   04/02/20 1022  TempSrc: Oral  PainSc:          Complications: No apparent anesthesia complications

## 2020-04-02 NOTE — Discharge Instructions (Signed)

## 2020-04-02 NOTE — Progress Notes (Signed)
Elevated BP during PAT visit: Pt BP elevated during PAT visit. Patient stated she was "extremely nervous" about her upcoming procedure. Further, she c/o 8/10 lower back pain. Patient stated she did not take her percocet yet for the day. She did however, take her metoprolol this morning.   04/01/20 1023 04/01/20 1120  Vitals  BP (!) 150/108 (!) 140/99

## 2020-04-02 NOTE — Anesthesia Procedure Notes (Signed)
Procedure Name: Intubation Date/Time: 04/02/2020 12:44 PM Performed by: Barrington Ellison, CRNA Pre-anesthesia Checklist: Patient identified, Emergency Drugs available, Suction available and Patient being monitored Patient Re-evaluated:Patient Re-evaluated prior to induction Oxygen Delivery Method: Circle System Utilized Preoxygenation: Pre-oxygenation with 100% oxygen Induction Type: IV induction Ventilation: Mask ventilation without difficulty Laryngoscope Size: Mac and 3 Grade View: Grade I Tube type: Oral Tube size: 7.0 mm Number of attempts: 1 Airway Equipment and Method: Stylet and Oral airway Placement Confirmation: ETT inserted through vocal cords under direct vision,  positive ETCO2 and breath sounds checked- equal and bilateral Secured at: 21 cm Tube secured with: Tape Dental Injury: Teeth and Oropharynx as per pre-operative assessment

## 2020-04-02 NOTE — Anesthesia Postprocedure Evaluation (Signed)
Anesthesia Post Note  Patient: Jelitza Manninen  Procedure(s) Performed: Left Lumbar four-Lumbar five disectomy (Left Spine Lumbar)     Patient location during evaluation: PACU Anesthesia Type: General Level of consciousness: awake and alert Pain management: pain level controlled Vital Signs Assessment: post-procedure vital signs reviewed and stable Respiratory status: spontaneous breathing, nonlabored ventilation, respiratory function stable and patient connected to nasal cannula oxygen Cardiovascular status: blood pressure returned to baseline and stable Postop Assessment: no apparent nausea or vomiting Anesthetic complications: no    Last Vitals:  Vitals:   04/02/20 1610 04/02/20 1642  BP: 120/69 (!) 116/56  Pulse: 97 97  Resp: 14 19  Temp:  37 C  SpO2: 95% 96%    Last Pain:  Vitals:   04/02/20 1642  TempSrc: Oral  PainSc:                  [place] Droll DAVID

## 2020-04-03 DIAGNOSIS — M5116 Intervertebral disc disorders with radiculopathy, lumbar region: Secondary | ICD-10-CM | POA: Diagnosis not present

## 2020-04-03 MED FILL — Thrombin (Recombinant) For Soln 20000 Unit: CUTANEOUS | Qty: 1 | Status: AC

## 2020-04-03 NOTE — Evaluation (Signed)
Physical Therapy Evaluation Patient Details Name: Rose Cortez MRN: [id number] DOB: 01-Oct-1986 Today's Date: 04/03/2020   History of Present Illness  Pt is a 34 y/o female s/p L L4-L5 discectomy. PMH including but not limited to HTN, obesity, anxiety, depression.  Clinical Impression  Pt presented ambulating in hallway with father upon arrival. Prior to admission, pt was independent with all functional mobility and ADLs. Pt lives with her parents in an apartment with two flights of stairs to enter. At the time of evaluation, pt moving very well overall. She tolerated hallway ambulation without use of an AD with no LOB or instability. Pt also participated in stair training this session without difficulties. PT provided pt education re: back precautions, car transfers with demonstration and a generalized walking program for pt to initiate upon d/c home. Pt expressed understanding. All questions were answered at the end of the session. No further acute PT needs identified at this time. PT signing off.      Follow Up Recommendations No PT follow up    Equipment Recommendations  None recommended by PT    Recommendations for Other Services       Precautions / Restrictions Precautions Precautions: Back Precaution Comments: reviewed 3/3 back precautions with pt throughout Required Braces or Orthoses: Spinal Brace Spinal Brace: Lumbar corset;Applied in sitting position Restrictions Weight Bearing Restrictions: No      Mobility  Bed Mobility               General bed mobility comments: pt OOB ambulating in hallway upon arrival  Transfers Overall transfer level: Modified independent Equipment used: None                Ambulation/Gait Ambulation/Gait assistance: Supervision Gait Distance (Feet): 1000 Feet Assistive device: None Gait Pattern/deviations: Step-through pattern;Decreased stride length Gait velocity: decreased   General Gait Details: no instability or LOB,  supervision for safety  Stairs Stairs: Yes Stairs assistance: Min guard Stair Management: One rail Left;Alternating pattern;Forwards Number of Stairs: 10 General stair comments: no instability or LOB  Wheelchair Mobility    Modified Rankin (Stroke Patients Only)       Balance Overall balance assessment: No apparent balance deficits (not formally assessed)                                           Pertinent Vitals/Pain Pain Assessment: Faces Faces Pain Scale: Hurts a little bit Pain Location: back Pain Descriptors / Indicators: Sore Pain Intervention(s): Monitored during session;Repositioned    Home Living Family/patient expects to be discharged to:: Private residence Living Arrangements: Parent Available Help at Discharge: Family;Available 24 hours/day Type of Home: Apartment Home Access: Stairs to enter Entrance Stairs-Rails: Lawyer of Steps: two flights Home Layout: One level Home Equipment: Toilet riser      Prior Function Level of Independence: Independent               Hand Dominance        Extremity/Trunk Assessment   Upper Extremity Assessment Upper Extremity Assessment: Overall WFL for tasks assessed;Defer to OT evaluation    Lower Extremity Assessment Lower Extremity Assessment: Overall WFL for tasks assessed    Cervical / Trunk Assessment Cervical / Trunk Assessment: Other exceptions Cervical / Trunk Exceptions: s/p lumbar sx  Communication   Communication: No difficulties  Cognition Arousal/Alertness: Awake/alert Behavior During Therapy: WFL for tasks assessed/performed Overall  Cognitive Status: Within Functional Limits for tasks assessed                                        General Comments      Exercises     Assessment/Plan    PT Assessment Patent does not need any further PT services  PT Problem List         PT Treatment Interventions      PT Goals  (Current goals can be found in the Care Plan section)  Acute Rehab PT Goals Patient Stated Goal: to get back to exercising and zumba classes PT Goal Formulation: All assessment and education complete, DC therapy    Frequency     Barriers to discharge        Co-evaluation               AM-PAC PT "6 Clicks" Mobility  Outcome Measure Help needed turning from your back to your side while in a flat bed without using bedrails?: None Help needed moving from lying on your back to sitting on the side of a flat bed without using bedrails?: None Help needed moving to and from a bed to a chair (including a wheelchair)?: None Help needed standing up from a chair using your arms (e.g., wheelchair or bedside chair)?: None Help needed to walk in hospital room?: None Help needed climbing 3-5 steps with a railing? : None 6 Click Score: 24    End of Session Equipment Utilized During Treatment: Back brace Activity Tolerance: Patient tolerated treatment well Patient left: in bed;with call bell/phone within reach;with family/visitor present;Other (comment)(sitting EOB) Nurse Communication: Mobility status PT Visit Diagnosis: Other abnormalities of gait and mobility (R26.89)    Time: 5638-9373 PT Time Calculation (min) (ACUTE ONLY): 20 min   Charges:   PT Evaluation $PT Eval Low Complexity: 1 Low          Eduard Clos, PT, DPT  Acute Rehabilitation Services Pager [phone] Office [place] 04/03/2020, 10:13 AM

## 2020-04-03 NOTE — Progress Notes (Signed)
    Subjective: Procedure(s) (LRB): Left Lumbar four-Lumbar five disectomy (Left) 1 Day Post-Op  Patient reports pain as 3 on 0-10 scale.  Reports none leg pain reports incisional back pain   Positive void Negative bowel movement Positive flatus Negative chest pain or shortness of breath  Objective: Vital signs in last 24 hours: Temp:  [97.9 F (36.6 C)-98.6 F (37 C)] 97.9 F (36.6 C) (04/22 0323) Pulse Rate:  [89-110] 89 (04/22 0323) Resp:  [14-20] 20 (04/22 0323) BP: (103-150)/(54-97) 103/54 (04/22 0323) SpO2:  [90 %-99 %] 96 % (04/22 0323)  Intake/Output from previous day: 04/21 0701 - 04/22 0700 In: 1450 [I.V.:1400; IV Piggyback:50] Out: 150 [Blood:150]  Labs: Recent Labs    04/01/20 1138  WBC 8.6  RBC 5.18*  HCT 45.5  PLT 400   Recent Labs    04/01/20 1138  NA 136  K 4.4  CL 101  CO2 27  BUN 14  CREATININE 0.67  GLUCOSE 101*  CALCIUM 9.8   Recent Labs    04/01/20 1138  INR 1.0    Physical Exam: Neurologically intact ABD soft Intact pulses distally Incision: dressing C/D/I and no drainage Compartment soft negative nerve root tension sign There is no height or weight on file to calculate BMI.   Assessment/Plan: Patient stable  xrays n/a Continue mobilization with physical therapy Continue care  Advance diet Up with therapy  Overall doing exceptionally well.  Back pain has improved since last night.  States that he expected given the surgical approach.  Patient denies any radicular leg pain and demonstrates no evidence of neurological deficit.  Plan on discharge to home today with follow-up with me in 2 weeks.  Venita Lick, MD [facility] [phone]

## 2020-04-03 NOTE — Progress Notes (Signed)
Patient is discharged from room 3C05 at this time. Alert and in stable condition. IV site d/c'd and instructions read to patient and father with understanding verbalized and all questions answered. Left unit via wheelchair with all belongings at side.

## 2020-04-03 NOTE — Evaluation (Signed)
Occupational Therapy Evaluation Patient Details Name: Rose Cortez MRN: [id number] DOB: [birth year] Today's Date: 04/03/2020    History of Present Illness Pt is a 34 y/o female s/p L L4-L5 discectomy. PMH including but not limited to HTN, obesity, anxiety, depression.   Clinical Impression   Pt educated at length in back precautions related to ADL and compensatory strategies, back brace use and wearing schedule and IADL to avoid. Pt plans to go home with her parents while she recovers. No further OT needs.    Follow Up Recommendations  No OT follow up    Equipment Recommendations  None recommended by OT    Recommendations for Other Services       Precautions / Restrictions Precautions Precautions: Back Precaution Comments: reviewed 3/3 back precautions with pt throughout Required Braces or Orthoses: Spinal Brace Spinal Brace: Lumbar corset;Applied in sitting position Restrictions Weight Bearing Restrictions: No      Mobility Bed Mobility Overal bed mobility: Modified Independent             General bed mobility comments: using log roll technique  Transfers Overall transfer level: Modified independent Equipment used: None                  Balance Overall balance assessment: No apparent balance deficits (not formally assessed)                                         ADL either performed or assessed with clinical judgement   ADL Overall ADL's : Modified independent                                       General ADL Comments: Educated pt in compensatory strategies for ADL. Recommended reacher, long handled bath sponge and tongs for pericare.     Vision Baseline Vision/History: No visual deficits       Perception     Praxis      Pertinent Vitals/Pain Pain Assessment: Faces Faces Pain Scale: Hurts a little bit Pain Location: back Pain Descriptors / Indicators: Sore Pain Intervention(s): Premedicated before  session;Monitored during session     Hand Dominance Right   Extremity/Trunk Assessment Upper Extremity Assessment Upper Extremity Assessment: Overall WFL for tasks assessed   Lower Extremity Assessment Lower Extremity Assessment: Defer to PT evaluation   Cervical / Trunk Assessment Cervical / Trunk Assessment: Other exceptions Cervical / Trunk Exceptions: s/p lumbar sx   Communication Communication Communication: No difficulties   Cognition Arousal/Alertness: Awake/alert Behavior During Therapy: WFL for tasks assessed/performed Overall Cognitive Status: Within Functional Limits for tasks assessed                                     General Comments       Exercises     Shoulder Instructions      Home Living Family/patient expects to be discharged to:: Private residence Living Arrangements: Parent Available Help at Discharge: Family;Available 24 hours/day Type of Home: Apartment Home Access: Stairs to enter [employer] of Steps: two flights Entrance Stairs-Rails: Left;Right Home Layout: One level     Bathroom Shower/Tub: Teacher, early years/pre: Standard     Home Equipment: Toilet riser  Prior Functioning/Environment Level of Independence: Independent                 OT Problem List:        OT Treatment/Interventions:      OT Goals(Current goals can be found in the care plan section) Acute Rehab OT Goals Patient Stated Goal: to get back to exercising and zumba classes  OT Frequency:     Barriers to D/C:            Co-evaluation              AM-PAC OT "6 Clicks" Daily Activity     Outcome Measure Help from another person eating meals?: None Help from another person taking care of personal grooming?: None Help from another person toileting, which includes using toliet, bedpan, or urinal?: None Help from another person bathing (including washing, rinsing, drying)?: None Help from another  person to put on and taking off regular upper body clothing?: None Help from another person to put on and taking off regular lower body clothing?: None 6 Click Score: 24   End of Session Equipment Utilized During Treatment: Back brace  Activity Tolerance: Patient tolerated treatment well Patient left:    OT Visit Diagnosis: Pain                Time: 9509-3267 OT Time Calculation (min): 30 min Charges:  OT General Charges $OT Visit: 1 Visit OT Evaluation $OT Eval Low Complexity: 1 Low OT Treatments $Self Care/Home Management : 8-22 mins  Martie Round, OTR/L Acute Rehabilitation Services Pager: [phone] Office: [phone]  Evern Bio 04/03/2020, 10:17 AM

## 2020-04-04 NOTE — Discharge Summary (Signed)
Patient ID: Rose Cortez MRN: [id number] DOB/AGE: October 10, 1986 34 y.o.  Admit date: 04/02/2020 Discharge date: 04/04/2020  Admission Diagnoses:  Active Problems:   Lumbar disc herniation   Discharge Diagnoses:  Active Problems:   Lumbar disc herniation  status post Procedure(s): Left Lumbar four-Lumbar five disectomy  Past Medical History:  Diagnosis Date  . ADHD   . Anal fissure   . Anxiety   . Back pain   . Depression   . Dysrhythmia    SVTs  . GERD (gastroesophageal reflux disease)   . High blood pressure   . History of PSVT (paroxysmal supraventricular tachycardia)    managed with Beta Blockers & Vagal Maneuvers.  Marland Kitchen HTN (hypertension)   . Hyperlipidemia   . 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 Liver problem   . Mononucleosis 02/2017  . Obesity   . Pneumonia 2014   walking PNA  . Pre-diabetes    Last A1C was 5.5 03/19/20  . Sleep apnea   . SVT (supraventricular tachycardia) ([place])   . Vitamin D deficiency     Surgeries: Procedure(s): Left Lumbar four-Lumbar five disectomy on 04/02/2020   Consultants:   Discharged Condition: Improved  Hospital Course: Rose Cortez is an 34 y.o. female who was admitted 04/02/2020 for operative treatment of Left L4-5 herniated disc. Patient failed conservative treatments (please see the history and physical for the specifics) and had severe unremitting pain that affects sleep, daily activities and work/hobbies. After pre-op clearance, the patient was taken to the operating room on 04/02/2020 and underwent  Procedure(s): Left Lumbar four-Lumbar five disectomy.    Patient was given perioperative antibiotics:  Anti-infectives (From admission, onward)   Start     Dose/Rate Route Frequency Ordered Stop   04/02/20 2045  ceFAZolin (ANCEF) IVPB 1 g/50 mL premix     1 g 100 mL/hr over 30 Minutes Intravenous Every 8 hours 04/02/20 1633 04/03/20 1017   04/02/20 1426  vancomycin (VANCOCIN) powder  Status:  Discontinued     As needed 04/02/20 1427 04/02/20 1517   04/02/20 0600  ceFAZolin (ANCEF) 3 g in dextrose 5 % 50 mL IVPB     3 g 100 mL/hr over 30 Minutes Intravenous 30 min pre-op 04/01/20 8099 04/03/20 1017       Patient was given sequential compression devices and early ambulation to prevent DVT.   Patient benefited maximally from hospital stay and there were no complications. At the time of discharge, the patient was urinating/moving their bowels without difficulty, tolerating a regular diet, pain is controlled with oral pain medications and they have been cleared by PT/OT.   Recent vital signs: No data found.   Recent laboratory studies:  Recent Labs    04/01/20 1138  WBC 8.6  HGB 15.2*  HCT 45.5  PLT 400  NA 136  K 4.4  CL 101  CO2 27  BUN 14  CREATININE 0.67  GLUCOSE 101*  INR 1.0  CALCIUM 9.8     Discharge Medications:   Allergies as of 04/03/2020      Reactions   Tranexamic Acid Other (See Comments)   Hypotension      Medication List    STOP taking these medications   acetaminophen 325 MG tablet Commonly known as: TYLENOL   cholecalciferol 1000 units tablet Commonly known as: VITAMIN D   gabapentin 300 MG capsule Commonly known as: NEURONTIN   HAIR/SKIN/NAILS/BIOTIN PO   ibuprofen 200 MG tablet Commonly known as: ADVIL  TAKE these medications   buPROPion 150 MG 12 hr tablet Commonly known as: WELLBUTRIN SR TAKE 1 TABLET BY MOUTH EVERY MORNING X 2 WEEKS, THEN 2 TABLETS BY MOUTH DAILY What changed: See the new instructions.   citalopram 20 MG tablet Commonly known as: CELEXA TAKE 1 TABLET BY MOUTH EVERY DAY   methocarbamol 500 MG tablet Commonly known as: Robaxin Take 1 tablet (500 mg total) by mouth every 8 (eight) hours as needed for up to 5 days for muscle spasms.   metoprolol succinate 50 MG 24 hr tablet Commonly known as: TOPROL-XL TAKE 1 TABLET BY MOUTH EVERY DAY   ondansetron 4 MG tablet Commonly known as: Zofran Take 1 tablet (4 mg total)  by mouth every 8 (eight) hours as needed for nausea or vomiting.   oxyCODONE-acetaminophen 10-325 MG tablet Commonly known as: Percocet Take 1 tablet by mouth every 6 (six) hours as needed for up to 5 days for pain. What changed:   when to take this  additional instructions   simvastatin 40 MG tablet Commonly known as: ZOCOR TAKE 1 TABLET BY MOUTH EVERY DAY       Diagnostic Studies: DG Chest 2 View  Result Date: 04/01/2020 CLINICAL DATA:  Preop for lumbar surgery. EXAM: CHEST - 2 VIEW COMPARISON:  February 03, 2014. FINDINGS: The heart size and mediastinal contours are within normal limits. Both lungs are clear. The visualized skeletal structures are unremarkable. IMPRESSION: No active cardiopulmonary disease. Electronically Signed   By: Lupita Raider M.D.   On: 04/01/2020 16:33   DG Lumbar Spine 2-3 Views  Result Date: 04/02/2020 CLINICAL DATA:  Lumbar spine surgery. EXAM: LUMBAR SPINE - 2-3 VIEW COMPARISON:  07/08/2014 FINDINGS: At the request of the referring provider image 3 has been annotated labeling the lumbar spine levels. Tissue spreaders are identified posterior to the L4 vertebra. There is a surgical probe which is directly posterior to the L4-5 disc space. IMPRESSION: Image 3 shows a surgical probe posterior to the L4-5 disc space. These findings were called to the operating room at the time of interpretation. Electronically Signed   By: Signa Kell M.D.   On: 04/02/2020 13:51    Discharge Instructions    Incentive spirometry RT   Complete by: As directed       Follow-up Information    Venita Lick, MD. Schedule an appointment as soon as possible for a visit in 2 weeks.   Specialty: Orthopedic Surgery Why: If symptoms worsen, For suture removal, For wound re-check Contact information: [address] [place] [address] [phone]           Discharge Plan:  discharge to home  Disposition: stable    Signed: Leonette Monarch Nidya Bouyer for  [facility] PA-C [facility] [phone] 04/04/2020, 8:29 AM

## 2020-04-15 ENCOUNTER — Ambulatory Visit: Payer: 59 | Attending: Internal Medicine

## 2020-04-15 DIAGNOSIS — Z23 Encounter for immunization: Secondary | ICD-10-CM

## 2020-04-15 NOTE — Progress Notes (Signed)
   Covid-19 Vaccination Clinic  Name:  Rose Cortez    MRN: [id number] DOB: 05-01-1986  04/15/2020  Rose Cortez was observed post Covid-19 immunization for 15 minutes without incident. She was provided with Vaccine Information Sheet and instruction to access the V-Safe system.   Rose Cortez was instructed to call 911 with any severe reactions post vaccine: Marland Kitchen Difficulty breathing  . Swelling of face and throat  . A fast heartbeat  . A bad rash all over body  . Dizziness and weakness   Immunizations Administered    Name Date Dose VIS Date Route   Pfizer COVID-19 Vaccine 04/15/2020 12:50 PM 0.3 mL 02/06/2019 Intramuscular   Manufacturer: [employer], [employer]   Lot: [id number]   NDC: [address]-5

## 2020-05-16 ENCOUNTER — Telehealth: Payer: Self-pay | Admitting: Internal Medicine

## 2020-05-16 DIAGNOSIS — F411 Generalized anxiety disorder: Secondary | ICD-10-CM

## 2020-05-16 MED ORDER — BUPROPION HCL ER (SR) 150 MG PO TB12: 150.0000 mg | ORAL_TABLET | Freq: Two times a day (BID) | ORAL | 1 refills | Status: AC

## 2020-05-16 NOTE — Telephone Encounter (Signed)
Pt would like a refill on bupropion (wellbutrin) for a 90 day supply. Send to [facility] in [place] [address]

## 2020-08-12 ENCOUNTER — Other Ambulatory Visit: Payer: Self-pay | Admitting: *Deleted

## 2020-08-12 DIAGNOSIS — E785 Hyperlipidemia, unspecified: Secondary | ICD-10-CM

## 2020-08-12 DIAGNOSIS — I471 Supraventricular tachycardia: Secondary | ICD-10-CM

## 2020-08-12 MED ORDER — METOPROLOL SUCCINATE ER 50 MG PO TB24: 50.0000 mg | ORAL_TABLET | Freq: Every day | ORAL | 0 refills | Status: AC

## 2020-08-12 MED ORDER — SIMVASTATIN 40 MG PO TABS: 40.0000 mg | ORAL_TABLET | Freq: Every day | ORAL | 0 refills | Status: AC

## 2021-02-10 IMAGING — CR DG CHEST 2V
2 series · 2 of 2 positions shown · non-contrast
Comparison: February 03, 2014.

CLINICAL DATA: Preop for lumbar surgery.

EXAM:
CHEST - 2 VIEW

[w chest pa]
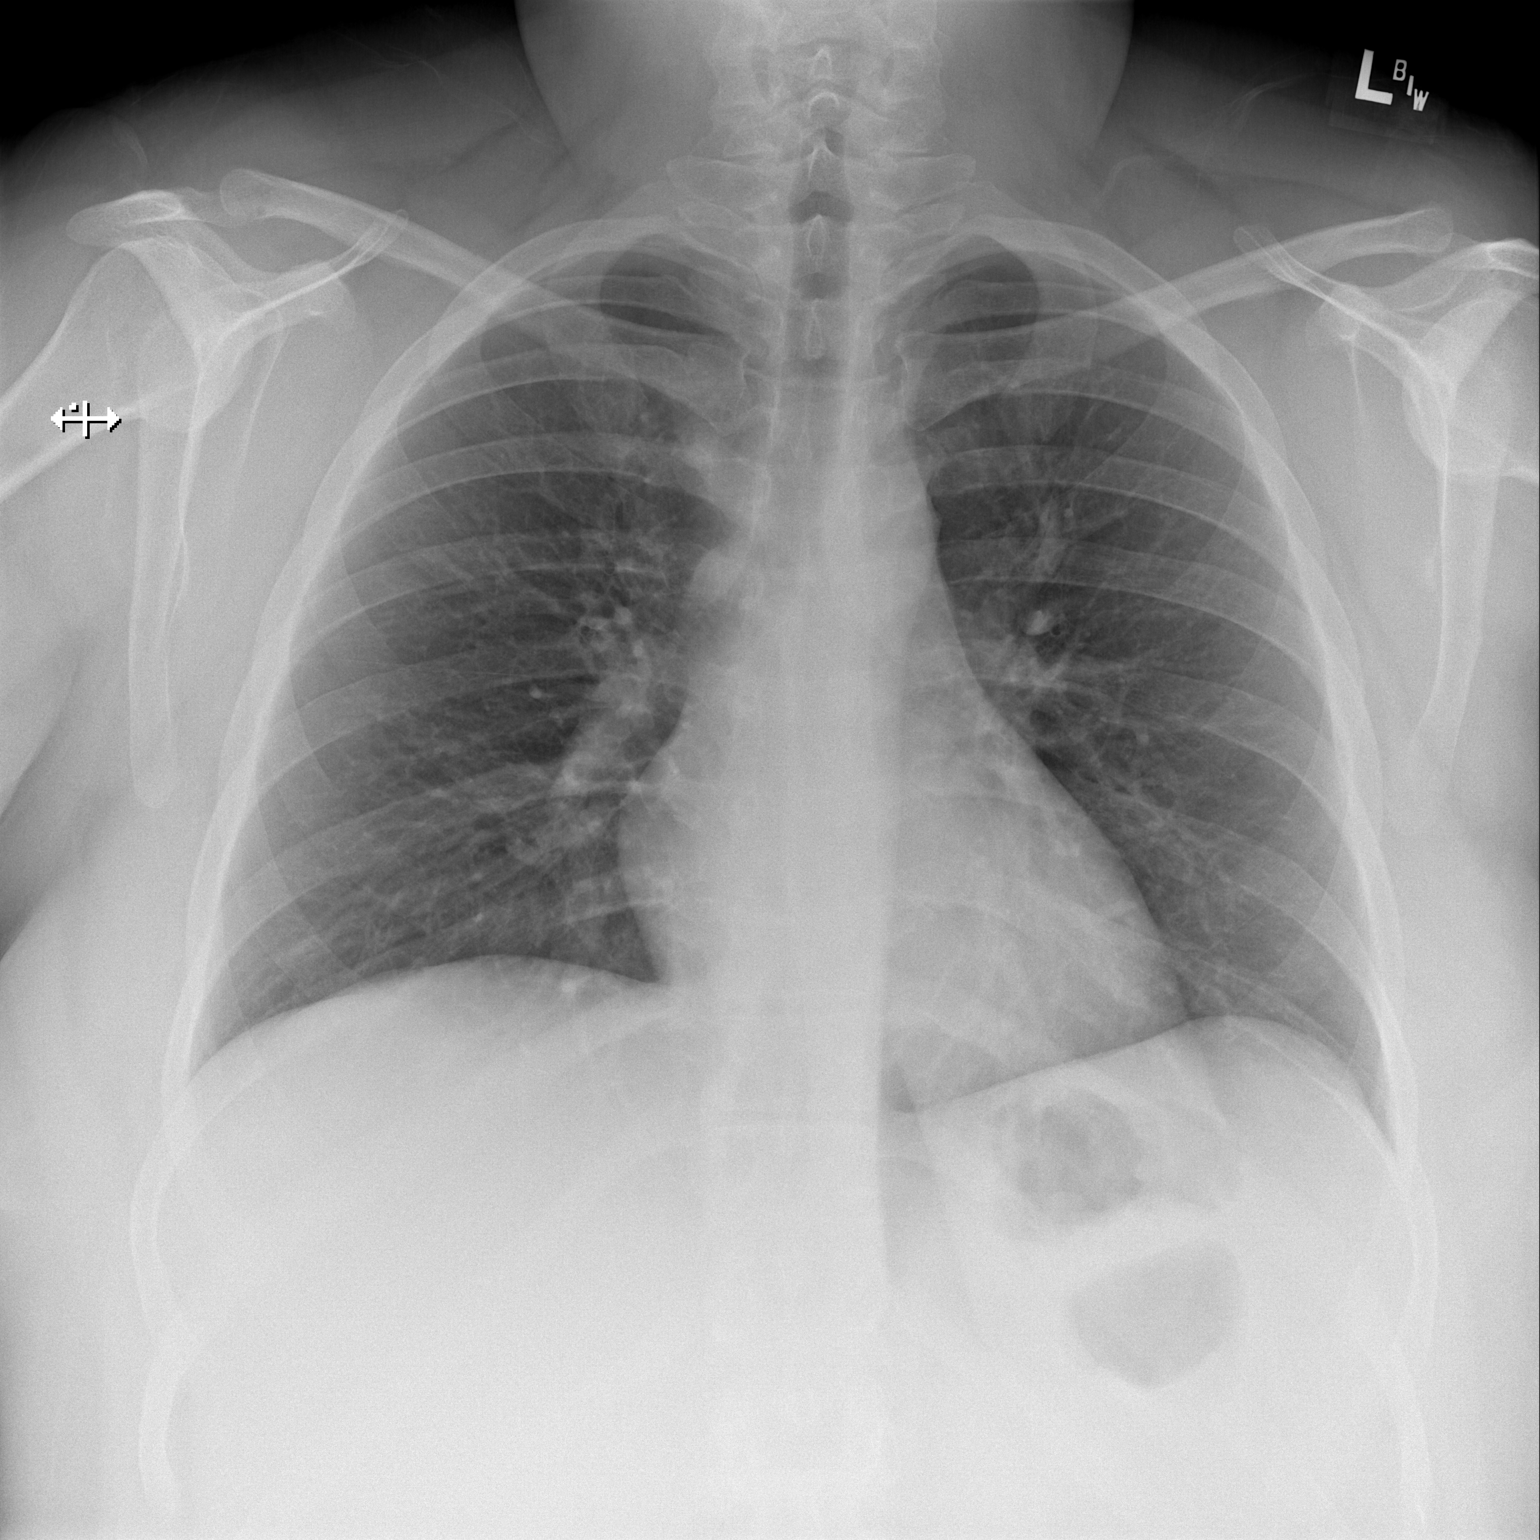

[w chest lat]
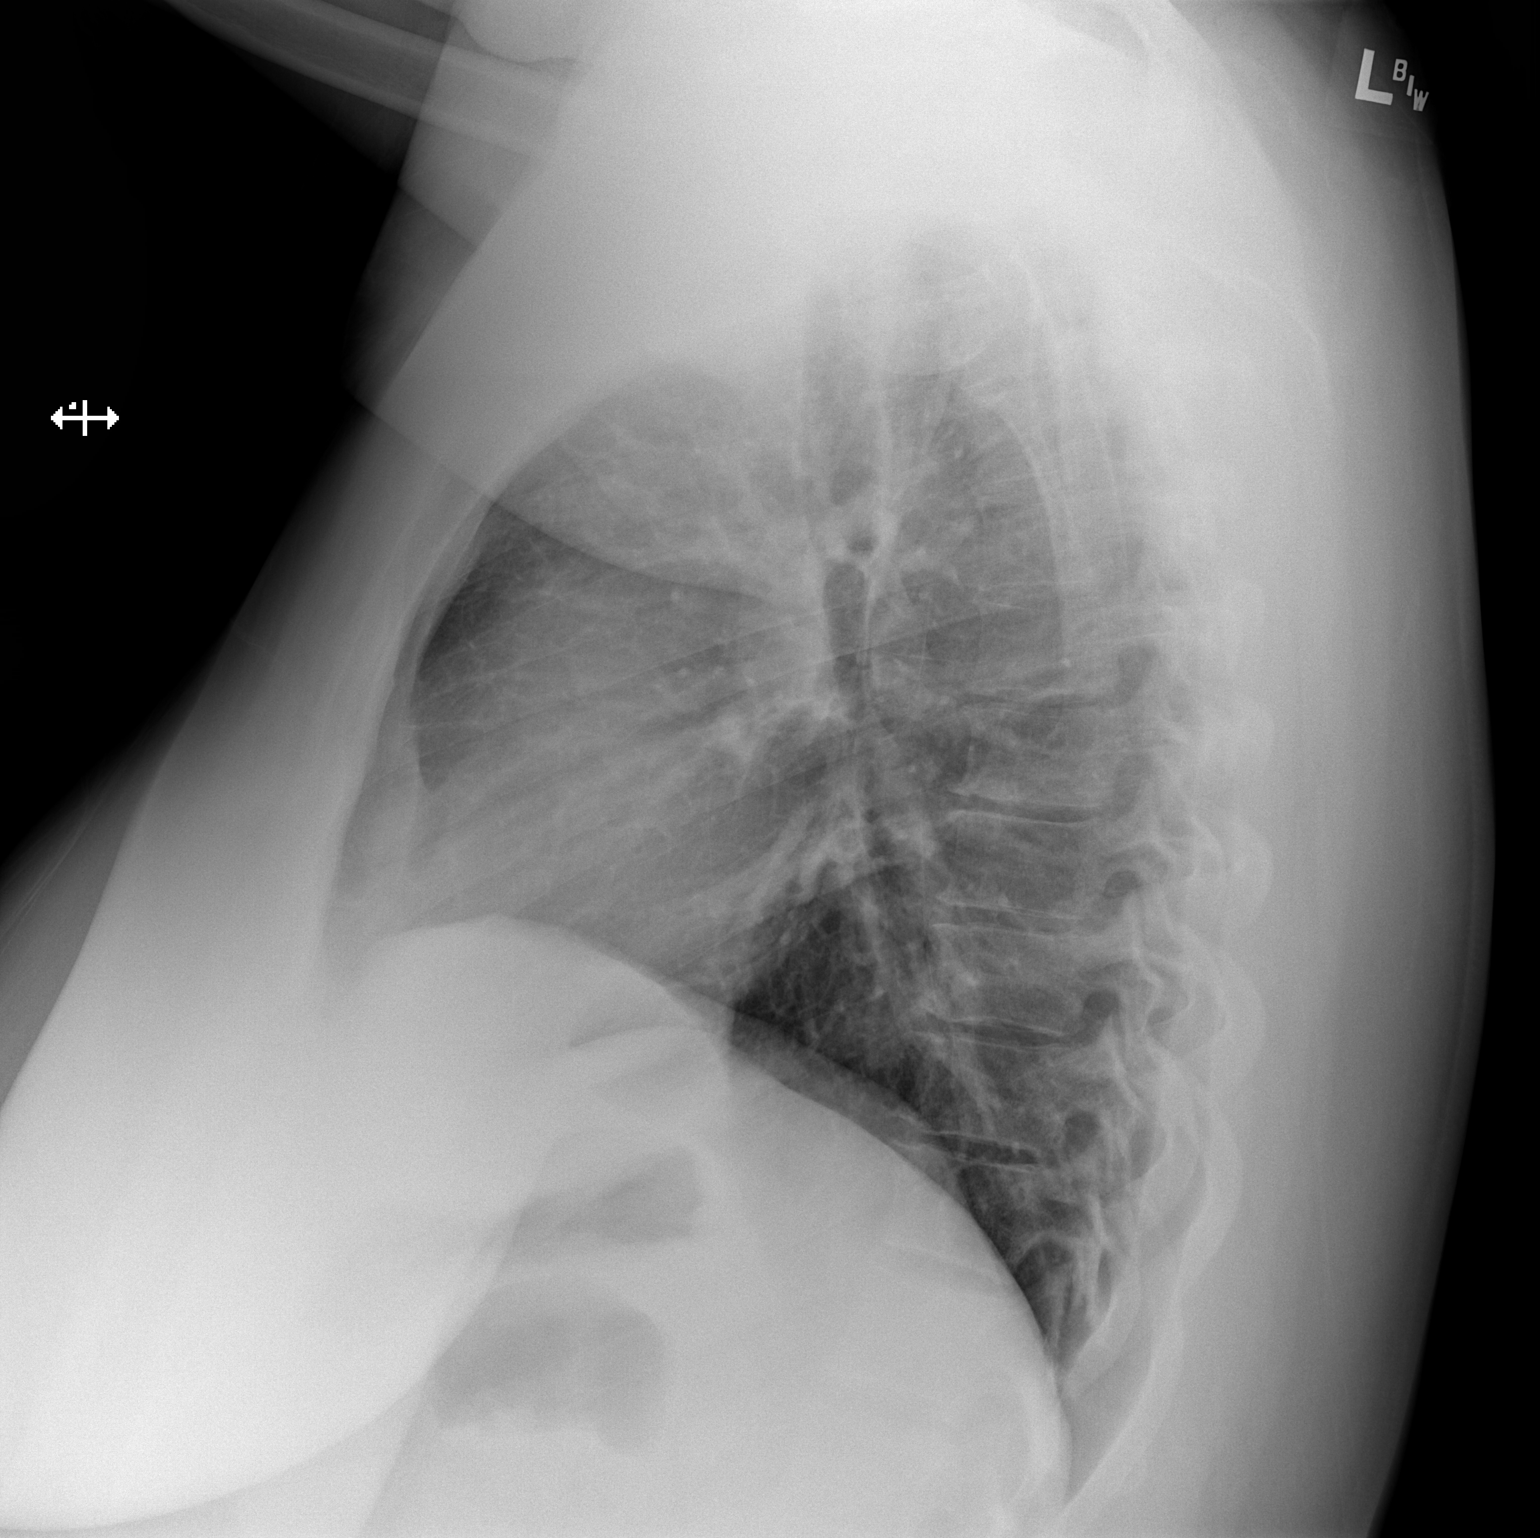

[2 of 2 positions shown; findings below may reference images not displayed]

FINDINGS: The heart size and mediastinal contours are within normal limits.
Both lungs are clear. The visualized skeletal structures are
unremarkable.
IMPRESSION: No active cardiopulmonary disease.

## 2022-07-21 ENCOUNTER — Encounter (INDEPENDENT_AMBULATORY_CARE_PROVIDER_SITE_OTHER): Payer: Self-pay
